# Patient Record
Sex: Female | Born: 1935 | Race: Black or African American | Hispanic: No | Smoking: Never smoker
Health system: Southern US, Community
[De-identification: ages and names within clinical notes are randomized; demographics above are authoritative.]

## PROBLEM LIST (undated history)

## (undated) DIAGNOSIS — R002 Palpitations: Secondary | ICD-10-CM

## (undated) DIAGNOSIS — E041 Nontoxic single thyroid nodule: Secondary | ICD-10-CM

## (undated) DIAGNOSIS — I10 Essential (primary) hypertension: Secondary | ICD-10-CM

## (undated) DIAGNOSIS — E785 Hyperlipidemia, unspecified: Secondary | ICD-10-CM

## (undated) DIAGNOSIS — R7303 Prediabetes: Secondary | ICD-10-CM

## (undated) DIAGNOSIS — F419 Anxiety disorder, unspecified: Secondary | ICD-10-CM

## (undated) DIAGNOSIS — R51 Headache: Secondary | ICD-10-CM

## (undated) DIAGNOSIS — Z9889 Other specified postprocedural states: Secondary | ICD-10-CM

## (undated) DIAGNOSIS — K219 Gastro-esophageal reflux disease without esophagitis: Secondary | ICD-10-CM

## (undated) DIAGNOSIS — R519 Headache, unspecified: Secondary | ICD-10-CM

## (undated) DIAGNOSIS — Z5189 Encounter for other specified aftercare: Secondary | ICD-10-CM

## (undated) DIAGNOSIS — D649 Anemia, unspecified: Secondary | ICD-10-CM

## (undated) DIAGNOSIS — H269 Unspecified cataract: Secondary | ICD-10-CM

## (undated) DIAGNOSIS — D573 Sickle-cell trait: Secondary | ICD-10-CM

## (undated) DIAGNOSIS — R42 Dizziness and giddiness: Secondary | ICD-10-CM

## (undated) DIAGNOSIS — H3530 Unspecified macular degeneration: Secondary | ICD-10-CM

## (undated) DIAGNOSIS — N6019 Diffuse cystic mastopathy of unspecified breast: Secondary | ICD-10-CM

## (undated) DIAGNOSIS — M1990 Unspecified osteoarthritis, unspecified site: Secondary | ICD-10-CM

## (undated) DIAGNOSIS — T7840XA Allergy, unspecified, initial encounter: Secondary | ICD-10-CM

## (undated) DIAGNOSIS — I712 Thoracic aortic aneurysm, without rupture: Secondary | ICD-10-CM

## (undated) HISTORY — DX: Anxiety disorder, unspecified: F41.9

## (undated) HISTORY — DX: Prediabetes: R73.03

## (undated) HISTORY — DX: Palpitations: R00.2

## (undated) HISTORY — DX: Headache: R51

## (undated) HISTORY — DX: Sickle-cell trait: D57.3

## (undated) HISTORY — DX: Allergy, unspecified, initial encounter: T78.40XA

## (undated) HISTORY — DX: Nontoxic single thyroid nodule: E04.1

## (undated) HISTORY — DX: Essential (primary) hypertension: I10

## (undated) HISTORY — DX: Unspecified macular degeneration: H35.30

## (undated) HISTORY — DX: Diffuse cystic mastopathy of unspecified breast: N60.19

## (undated) HISTORY — DX: Other specified postprocedural states: Z98.890

## (undated) HISTORY — DX: Unspecified cataract: H26.9

## (undated) HISTORY — DX: Headache, unspecified: R51.9

## (undated) HISTORY — DX: Anemia, unspecified: D64.9

## (undated) HISTORY — DX: Hyperlipidemia, unspecified: E78.5

## (undated) HISTORY — DX: Unspecified osteoarthritis, unspecified site: M19.90

## (undated) HISTORY — DX: Encounter for other specified aftercare: Z51.89

## (undated) HISTORY — PX: TOTAL KNEE ARTHROPLASTY: SHX125

---

## 2006-02-16 ENCOUNTER — Emergency Department (HOSPITAL_COMMUNITY): Admission: EM | Admit: 2006-02-16 | Discharge: 2006-02-16 | Payer: Self-pay | Admitting: Emergency Medicine

## 2006-05-10 ENCOUNTER — Emergency Department (HOSPITAL_COMMUNITY): Admission: EM | Admit: 2006-05-10 | Discharge: 2006-05-10 | Payer: Self-pay | Admitting: Family Medicine

## 2006-07-02 ENCOUNTER — Encounter: Admission: RE | Admit: 2006-07-02 | Discharge: 2006-07-02 | Payer: Self-pay | Admitting: Family Medicine

## 2006-07-15 ENCOUNTER — Encounter: Admission: RE | Admit: 2006-07-15 | Discharge: 2006-07-15 | Payer: Self-pay | Admitting: Family Medicine

## 2006-07-27 ENCOUNTER — Encounter: Admission: RE | Admit: 2006-07-27 | Discharge: 2006-07-27 | Payer: Self-pay | Admitting: Family Medicine

## 2006-08-05 ENCOUNTER — Emergency Department (HOSPITAL_COMMUNITY): Admission: EM | Admit: 2006-08-05 | Discharge: 2006-08-05 | Payer: Self-pay | Admitting: Family Medicine

## 2006-08-17 ENCOUNTER — Encounter: Admission: RE | Admit: 2006-08-17 | Discharge: 2006-08-17 | Payer: Self-pay | Admitting: Surgery

## 2006-08-17 ENCOUNTER — Encounter (INDEPENDENT_AMBULATORY_CARE_PROVIDER_SITE_OTHER): Payer: Self-pay | Admitting: *Deleted

## 2006-08-17 ENCOUNTER — Other Ambulatory Visit: Admission: RE | Admit: 2006-08-17 | Discharge: 2006-08-17 | Payer: Self-pay | Admitting: Interventional Radiology

## 2007-05-13 ENCOUNTER — Emergency Department (HOSPITAL_COMMUNITY): Admission: EM | Admit: 2007-05-13 | Discharge: 2007-05-13 | Payer: Self-pay | Admitting: Emergency Medicine

## 2007-06-28 ENCOUNTER — Encounter: Admission: RE | Admit: 2007-06-28 | Discharge: 2007-06-28 | Payer: Self-pay | Admitting: General Surgery

## 2007-07-20 ENCOUNTER — Encounter: Payer: Self-pay | Admitting: Gastroenterology

## 2007-08-10 ENCOUNTER — Encounter: Admission: RE | Admit: 2007-08-10 | Discharge: 2007-08-10 | Payer: Self-pay | Admitting: *Deleted

## 2007-08-31 ENCOUNTER — Encounter: Admission: RE | Admit: 2007-08-31 | Discharge: 2007-08-31 | Payer: Self-pay | Admitting: *Deleted

## 2007-10-29 DIAGNOSIS — I10 Essential (primary) hypertension: Secondary | ICD-10-CM | POA: Insufficient documentation

## 2007-10-29 DIAGNOSIS — H3530 Unspecified macular degeneration: Secondary | ICD-10-CM | POA: Insufficient documentation

## 2007-10-29 DIAGNOSIS — E041 Nontoxic single thyroid nodule: Secondary | ICD-10-CM | POA: Insufficient documentation

## 2007-10-29 DIAGNOSIS — N6019 Diffuse cystic mastopathy of unspecified breast: Secondary | ICD-10-CM | POA: Insufficient documentation

## 2007-10-31 ENCOUNTER — Encounter: Payer: Self-pay | Admitting: Gastroenterology

## 2007-11-01 ENCOUNTER — Ambulatory Visit: Payer: Self-pay | Admitting: Gastroenterology

## 2007-12-26 ENCOUNTER — Ambulatory Visit (HOSPITAL_COMMUNITY): Admission: RE | Admit: 2007-12-26 | Discharge: 2007-12-26 | Payer: Self-pay | Admitting: Obstetrics and Gynecology

## 2008-04-05 ENCOUNTER — Encounter: Admission: RE | Admit: 2008-04-05 | Discharge: 2008-04-05 | Payer: Self-pay | Admitting: Otolaryngology

## 2008-07-09 ENCOUNTER — Ambulatory Visit: Payer: Self-pay | Admitting: Internal Medicine

## 2008-08-10 ENCOUNTER — Encounter: Admission: RE | Admit: 2008-08-10 | Discharge: 2008-08-10 | Payer: Self-pay | Admitting: *Deleted

## 2008-09-13 ENCOUNTER — Ambulatory Visit: Payer: Self-pay

## 2008-09-13 ENCOUNTER — Encounter: Payer: Self-pay | Admitting: Internal Medicine

## 2008-09-13 ENCOUNTER — Ambulatory Visit: Payer: Self-pay | Admitting: Internal Medicine

## 2008-09-21 ENCOUNTER — Encounter: Admission: RE | Admit: 2008-09-21 | Discharge: 2008-09-21 | Payer: Self-pay | Admitting: *Deleted

## 2008-10-08 ENCOUNTER — Telehealth (INDEPENDENT_AMBULATORY_CARE_PROVIDER_SITE_OTHER): Payer: Self-pay | Admitting: *Deleted

## 2008-10-17 ENCOUNTER — Telehealth: Payer: Self-pay | Admitting: Internal Medicine

## 2008-11-08 ENCOUNTER — Ambulatory Visit: Payer: Self-pay | Admitting: Internal Medicine

## 2008-11-12 ENCOUNTER — Telehealth: Payer: Self-pay | Admitting: Internal Medicine

## 2008-12-10 ENCOUNTER — Encounter: Admission: RE | Admit: 2008-12-10 | Discharge: 2008-12-10 | Payer: Self-pay | Admitting: *Deleted

## 2009-02-14 ENCOUNTER — Ambulatory Visit: Payer: Self-pay | Admitting: Internal Medicine

## 2009-03-18 ENCOUNTER — Telehealth: Payer: Self-pay | Admitting: Internal Medicine

## 2009-04-01 ENCOUNTER — Emergency Department (HOSPITAL_COMMUNITY): Admission: EM | Admit: 2009-04-01 | Discharge: 2009-04-01 | Payer: Self-pay | Admitting: Family Medicine

## 2009-05-09 ENCOUNTER — Ambulatory Visit: Payer: Self-pay | Admitting: Internal Medicine

## 2009-05-27 ENCOUNTER — Ambulatory Visit: Payer: Self-pay | Admitting: Internal Medicine

## 2009-05-27 LAB — CONVERTED CEMR LAB
BUN: 7 mg/dL (ref 6–23)
Basophils Absolute: 0.1 10*3/uL (ref 0.0–0.1)
Basophils Relative: 0.9 % (ref 0.0–3.0)
CO2: 28 meq/L (ref 19–32)
Calcium: 9.1 mg/dL (ref 8.4–10.5)
Chloride: 105 meq/L (ref 96–112)
Creatinine, Ser: 0.8 mg/dL (ref 0.4–1.2)
Eosinophils Absolute: 0.1 10*3/uL (ref 0.0–0.7)
Eosinophils Relative: 2.4 % (ref 0.0–5.0)
GFR calc non Af Amer: 90.86 mL/min (ref >60)
Glucose, Bld: 90 mg/dL (ref 70–99)
HCT: 36.4 % (ref 36.0–46.0)
Hemoglobin: 12.3 g/dL (ref 12.0–15.0)
INR: 1 (ref 0.8–1.0)
Lymphocytes Relative: 35.5 % (ref 12.0–46.0)
Lymphs Abs: 2.1 10*3/uL (ref 0.7–4.0)
MCHC: 33.9 g/dL (ref 30.0–36.0)
MCV: 100.1 fL — ABNORMAL HIGH (ref 78.0–100.0)
Monocytes Absolute: 0.4 10*3/uL (ref 0.1–1.0)
Monocytes Relative: 6.9 % (ref 3.0–12.0)
Neutro Abs: 3.2 10*3/uL (ref 1.4–7.7)
Neutrophils Relative %: 54.3 % (ref 43.0–77.0)
Platelets: 166 10*3/uL (ref 150.0–400.0)
Potassium: 3.5 meq/L (ref 3.5–5.1)
Prothrombin Time: 10.3 s (ref 9.1–11.7)
RBC: 3.64 M/uL — ABNORMAL LOW (ref 3.87–5.11)
RDW: 12.5 % (ref 11.5–14.6)
Sodium: 140 meq/L (ref 135–145)
WBC: 5.9 10*3/uL (ref 4.5–10.5)
aPTT: 29.1 s — ABNORMAL HIGH (ref 21.7–28.8)

## 2009-05-29 ENCOUNTER — Telehealth (INDEPENDENT_AMBULATORY_CARE_PROVIDER_SITE_OTHER): Payer: Self-pay | Admitting: *Deleted

## 2009-06-03 ENCOUNTER — Ambulatory Visit: Payer: Self-pay | Admitting: Internal Medicine

## 2009-06-03 ENCOUNTER — Ambulatory Visit (HOSPITAL_COMMUNITY): Admission: RE | Admit: 2009-06-03 | Discharge: 2009-06-04 | Payer: Self-pay | Admitting: Internal Medicine

## 2009-06-10 ENCOUNTER — Telehealth: Payer: Self-pay | Admitting: Internal Medicine

## 2009-07-12 ENCOUNTER — Ambulatory Visit: Payer: Self-pay | Admitting: Internal Medicine

## 2009-07-15 ENCOUNTER — Ambulatory Visit: Payer: Self-pay | Admitting: Internal Medicine

## 2009-08-01 LAB — CONVERTED CEMR LAB
BUN: 14 mg/dL (ref 6–23)
CO2: 24 meq/L (ref 19–32)
Calcium: 9.3 mg/dL (ref 8.4–10.5)
Chloride: 102 meq/L (ref 96–112)
Creatinine, Ser: 0.7 mg/dL (ref 0.40–1.20)
Glucose, Bld: 86 mg/dL (ref 70–99)
Potassium: 3.9 meq/L (ref 3.5–5.3)
Sodium: 138 meq/L (ref 135–145)

## 2009-08-20 LAB — HM MAMMOGRAPHY: HM Mammogram: NORMAL

## 2009-10-01 ENCOUNTER — Other Ambulatory Visit: Admission: RE | Admit: 2009-10-01 | Discharge: 2009-10-01 | Payer: Self-pay | Admitting: Interventional Radiology

## 2009-10-01 ENCOUNTER — Encounter: Admission: RE | Admit: 2009-10-01 | Discharge: 2009-10-01 | Payer: Self-pay | Admitting: Internal Medicine

## 2009-11-06 ENCOUNTER — Other Ambulatory Visit: Admission: RE | Admit: 2009-11-06 | Discharge: 2009-11-06 | Payer: Self-pay | Admitting: Obstetrics and Gynecology

## 2009-12-03 ENCOUNTER — Encounter: Admission: RE | Admit: 2009-12-03 | Discharge: 2009-12-03 | Payer: Self-pay | Admitting: Obstetrics and Gynecology

## 2009-12-12 ENCOUNTER — Encounter: Admission: RE | Admit: 2009-12-12 | Discharge: 2009-12-12 | Payer: Self-pay | Admitting: Internal Medicine

## 2010-01-24 ENCOUNTER — Ambulatory Visit: Payer: Self-pay | Admitting: Gastroenterology

## 2010-01-24 DIAGNOSIS — R1013 Epigastric pain: Secondary | ICD-10-CM

## 2010-01-24 DIAGNOSIS — K3189 Other diseases of stomach and duodenum: Secondary | ICD-10-CM | POA: Insufficient documentation

## 2010-01-24 DIAGNOSIS — Z860100 Personal history of colon polyps, unspecified: Secondary | ICD-10-CM | POA: Insufficient documentation

## 2010-01-27 ENCOUNTER — Ambulatory Visit: Payer: Self-pay | Admitting: Cardiovascular Disease

## 2010-01-27 LAB — CONVERTED CEMR LAB
ALT: 14 units/L (ref 0–35)
AST: 18 units/L (ref 0–37)
Albumin: 4.2 g/dL (ref 3.5–5.2)
Alkaline Phosphatase: 62 units/L (ref 39–117)
BUN: 10 mg/dL (ref 6–23)
Basophils Absolute: 0 10*3/uL (ref 0.0–0.1)
Basophils Relative: 0.5 % (ref 0.0–3.0)
CO2: 31 meq/L (ref 19–32)
Calcium: 9.2 mg/dL (ref 8.4–10.5)
Chloride: 101 meq/L (ref 96–112)
Creatinine, Ser: 0.8 mg/dL (ref 0.4–1.2)
Eosinophils Absolute: 0.2 10*3/uL (ref 0.0–0.7)
Eosinophils Relative: 3.8 % (ref 0.0–5.0)
GFR calc non Af Amer: 90.69 mL/min (ref >60)
Glucose, Bld: 79 mg/dL (ref 70–99)
HCT: 33.4 % — ABNORMAL LOW (ref 36.0–46.0)
Hemoglobin: 11.5 g/dL — ABNORMAL LOW (ref 12.0–15.0)
Lymphocytes Relative: 37.5 % (ref 12.0–46.0)
Lymphs Abs: 2.2 10*3/uL (ref 0.7–4.0)
MCHC: 34.5 g/dL (ref 30.0–36.0)
MCV: 98.2 fL (ref 78.0–100.0)
Monocytes Absolute: 0.4 10*3/uL (ref 0.1–1.0)
Monocytes Relative: 7.6 % (ref 3.0–12.0)
Neutro Abs: 2.9 10*3/uL (ref 1.4–7.7)
Neutrophils Relative %: 50.6 % (ref 43.0–77.0)
Platelets: 185 10*3/uL (ref 150.0–400.0)
Potassium: 4 meq/L (ref 3.5–5.1)
RBC: 3.41 M/uL — ABNORMAL LOW (ref 3.87–5.11)
RDW: 13.4 % (ref 11.5–14.6)
Sed Rate: 36 mm/hr — ABNORMAL HIGH (ref 0–22)
Sodium: 139 meq/L (ref 135–145)
Total Bilirubin: 0.7 mg/dL (ref 0.3–1.2)
Total Protein: 7.3 g/dL (ref 6.0–8.3)
WBC: 5.7 10*3/uL (ref 4.5–10.5)

## 2010-01-31 ENCOUNTER — Ambulatory Visit: Payer: Self-pay | Admitting: Internal Medicine

## 2010-01-31 ENCOUNTER — Encounter (INDEPENDENT_AMBULATORY_CARE_PROVIDER_SITE_OTHER): Payer: Self-pay | Admitting: *Deleted

## 2010-02-04 ENCOUNTER — Telehealth (INDEPENDENT_AMBULATORY_CARE_PROVIDER_SITE_OTHER): Payer: Self-pay | Admitting: *Deleted

## 2010-02-04 ENCOUNTER — Telehealth: Payer: Self-pay | Admitting: Internal Medicine

## 2010-02-18 ENCOUNTER — Telehealth: Payer: Self-pay | Admitting: Internal Medicine

## 2010-02-20 ENCOUNTER — Ambulatory Visit: Payer: Self-pay | Admitting: Internal Medicine

## 2010-02-20 DIAGNOSIS — E785 Hyperlipidemia, unspecified: Secondary | ICD-10-CM | POA: Insufficient documentation

## 2010-02-20 LAB — CONVERTED CEMR LAB
ALT: 12 units/L (ref 0–35)
AST: 16 units/L (ref 0–37)
Albumin: 3.8 g/dL (ref 3.5–5.2)
Alkaline Phosphatase: 63 units/L (ref 39–117)
BUN: 11 mg/dL (ref 6–23)
Bilirubin, Direct: 0.1 mg/dL (ref 0.0–0.3)
CO2: 29 meq/L (ref 19–32)
Calcium: 9.4 mg/dL (ref 8.4–10.5)
Chloride: 104 meq/L (ref 96–112)
Cholesterol: 224 mg/dL — ABNORMAL HIGH (ref 0–200)
Creatinine, Ser: 0.7 mg/dL (ref 0.4–1.2)
Direct LDL: 134.4 mg/dL
GFR calc non Af Amer: 100.77 mL/min (ref >60)
Glucose, Bld: 84 mg/dL (ref 70–99)
HDL: 58.1 mg/dL (ref >39.00)
Potassium: 4.2 meq/L (ref 3.5–5.1)
Sodium: 140 meq/L (ref 135–145)
TSH: 0.7 microintl units/mL (ref 0.35–5.50)
Total Bilirubin: 0.6 mg/dL (ref 0.3–1.2)
Total CHOL/HDL Ratio: 4
Total Protein: 7 g/dL (ref 6.0–8.3)
Triglycerides: 148 mg/dL (ref 0.0–149.0)
VLDL: 29.6 mg/dL (ref 0.0–40.0)

## 2010-02-21 ENCOUNTER — Encounter (INDEPENDENT_AMBULATORY_CARE_PROVIDER_SITE_OTHER): Payer: Self-pay | Admitting: *Deleted

## 2010-02-26 ENCOUNTER — Encounter (INDEPENDENT_AMBULATORY_CARE_PROVIDER_SITE_OTHER): Payer: Self-pay | Admitting: *Deleted

## 2010-02-28 ENCOUNTER — Ambulatory Visit: Payer: Self-pay | Admitting: Internal Medicine

## 2010-02-28 ENCOUNTER — Encounter (INDEPENDENT_AMBULATORY_CARE_PROVIDER_SITE_OTHER): Payer: Self-pay | Admitting: *Deleted

## 2010-03-04 ENCOUNTER — Telehealth: Payer: Self-pay | Admitting: Internal Medicine

## 2010-03-06 ENCOUNTER — Ambulatory Visit: Payer: Self-pay | Admitting: Internal Medicine

## 2010-03-10 ENCOUNTER — Encounter (INDEPENDENT_AMBULATORY_CARE_PROVIDER_SITE_OTHER): Payer: Self-pay | Admitting: *Deleted

## 2010-03-11 ENCOUNTER — Ambulatory Visit (HOSPITAL_COMMUNITY): Admission: RE | Admit: 2010-03-11 | Discharge: 2010-03-11 | Payer: Self-pay | Admitting: Orthopedic Surgery

## 2010-03-17 ENCOUNTER — Ambulatory Visit: Payer: Self-pay | Admitting: Internal Medicine

## 2010-03-17 DIAGNOSIS — R928 Other abnormal and inconclusive findings on diagnostic imaging of breast: Secondary | ICD-10-CM | POA: Insufficient documentation

## 2010-03-18 ENCOUNTER — Encounter (INDEPENDENT_AMBULATORY_CARE_PROVIDER_SITE_OTHER): Payer: Self-pay | Admitting: Internal Medicine

## 2010-03-18 ENCOUNTER — Observation Stay (HOSPITAL_COMMUNITY): Admission: EM | Admit: 2010-03-18 | Discharge: 2010-03-19 | Payer: Self-pay | Admitting: Emergency Medicine

## 2010-03-21 ENCOUNTER — Ambulatory Visit: Payer: Self-pay | Admitting: Gastroenterology

## 2010-03-25 ENCOUNTER — Telehealth (INDEPENDENT_AMBULATORY_CARE_PROVIDER_SITE_OTHER): Payer: Self-pay

## 2010-03-26 ENCOUNTER — Encounter: Payer: Self-pay | Admitting: Cardiovascular Disease

## 2010-03-26 ENCOUNTER — Encounter (HOSPITAL_COMMUNITY): Admission: RE | Admit: 2010-03-26 | Discharge: 2010-04-07 | Payer: Self-pay | Admitting: Internal Medicine

## 2010-03-26 ENCOUNTER — Ambulatory Visit: Payer: Self-pay

## 2010-03-26 ENCOUNTER — Ambulatory Visit: Payer: Self-pay | Admitting: Cardiovascular Disease

## 2010-04-02 ENCOUNTER — Encounter: Admission: RE | Admit: 2010-04-02 | Discharge: 2010-04-02 | Payer: Self-pay | Admitting: Internal Medicine

## 2010-04-04 ENCOUNTER — Ambulatory Visit: Payer: Self-pay | Admitting: Internal Medicine

## 2010-04-04 DIAGNOSIS — H9190 Unspecified hearing loss, unspecified ear: Secondary | ICD-10-CM | POA: Insufficient documentation

## 2010-04-04 LAB — CONVERTED CEMR LAB
Cholesterol, target level: 200 mg/dL
HDL goal, serum: 40 mg/dL
LDL Goal: 130 mg/dL

## 2010-04-07 ENCOUNTER — Encounter (INDEPENDENT_AMBULATORY_CARE_PROVIDER_SITE_OTHER): Payer: Self-pay | Admitting: *Deleted

## 2010-04-22 ENCOUNTER — Encounter: Payer: Self-pay | Admitting: Internal Medicine

## 2010-04-22 ENCOUNTER — Telehealth: Payer: Self-pay | Admitting: Internal Medicine

## 2010-04-29 ENCOUNTER — Encounter (INDEPENDENT_AMBULATORY_CARE_PROVIDER_SITE_OTHER): Payer: Self-pay | Admitting: *Deleted

## 2010-05-05 ENCOUNTER — Ambulatory Visit: Payer: Self-pay | Admitting: Gastroenterology

## 2010-05-19 ENCOUNTER — Telehealth: Payer: Self-pay | Admitting: Gastroenterology

## 2010-05-20 ENCOUNTER — Ambulatory Visit: Payer: Self-pay | Admitting: Gastroenterology

## 2010-05-20 DIAGNOSIS — R933 Abnormal findings on diagnostic imaging of other parts of digestive tract: Secondary | ICD-10-CM | POA: Insufficient documentation

## 2010-05-20 LAB — HM COLONOSCOPY

## 2010-05-21 ENCOUNTER — Telehealth: Payer: Self-pay | Admitting: Gastroenterology

## 2010-05-21 DIAGNOSIS — K644 Residual hemorrhoidal skin tags: Secondary | ICD-10-CM | POA: Insufficient documentation

## 2010-05-30 ENCOUNTER — Ambulatory Visit: Payer: Self-pay | Admitting: Internal Medicine

## 2010-05-30 DIAGNOSIS — IMO0001 Reserved for inherently not codable concepts without codable children: Secondary | ICD-10-CM | POA: Insufficient documentation

## 2010-05-30 DIAGNOSIS — I712 Thoracic aortic aneurysm, without rupture: Secondary | ICD-10-CM | POA: Insufficient documentation

## 2010-05-30 LAB — CONVERTED CEMR LAB
ALT: 17 units/L (ref 0–35)
AST: 20 units/L (ref 0–37)
Albumin: 3.8 g/dL (ref 3.5–5.2)
Alkaline Phosphatase: 70 units/L (ref 39–117)
BUN: 15 mg/dL (ref 6–23)
Basophils Absolute: 0 10*3/uL (ref 0.0–0.1)
Basophils Relative: 0.6 % (ref 0.0–3.0)
Bilirubin, Direct: 0.1 mg/dL (ref 0.0–0.3)
CO2: 28 meq/L (ref 19–32)
Calcium: 9 mg/dL (ref 8.4–10.5)
Chloride: 105 meq/L (ref 96–112)
Cholesterol, target level: 200 mg/dL
Cholesterol: 227 mg/dL — ABNORMAL HIGH (ref 0–200)
Creatinine, Ser: 0.9 mg/dL (ref 0.4–1.2)
Direct LDL: 134 mg/dL
Eosinophils Absolute: 0.2 10*3/uL (ref 0.0–0.7)
Eosinophils Relative: 4 % (ref 0.0–5.0)
GFR calc non Af Amer: 82.24 mL/min (ref >60.00)
Glucose, Bld: 89 mg/dL (ref 70–99)
HCT: 34.1 % — ABNORMAL LOW (ref 36.0–46.0)
HDL goal, serum: 40 mg/dL
HDL: 57.1 mg/dL (ref >39.00)
Hemoglobin: 11.6 g/dL — ABNORMAL LOW (ref 12.0–15.0)
LDL Goal: 100 mg/dL
Lymphocytes Relative: 43.6 % (ref 12.0–46.0)
Lymphs Abs: 2.7 10*3/uL (ref 0.7–4.0)
MCHC: 34 g/dL (ref 30.0–36.0)
MCV: 98.2 fL (ref 78.0–100.0)
Monocytes Absolute: 0.4 10*3/uL (ref 0.1–1.0)
Monocytes Relative: 6.9 % (ref 3.0–12.0)
Neutro Abs: 2.8 10*3/uL (ref 1.4–7.7)
Neutrophils Relative %: 44.9 % (ref 43.0–77.0)
Platelets: 179 10*3/uL (ref 150.0–400.0)
Potassium: 3.7 meq/L (ref 3.5–5.1)
RBC: 3.48 M/uL — ABNORMAL LOW (ref 3.87–5.11)
RDW: 13.6 % (ref 11.5–14.6)
Sodium: 142 meq/L (ref 135–145)
TSH: 0.4 microintl units/mL (ref 0.35–5.50)
Total Bilirubin: 0.5 mg/dL (ref 0.3–1.2)
Total CHOL/HDL Ratio: 4
Total CK: 143 units/L (ref 7–177)
Total Protein: 7.1 g/dL (ref 6.0–8.3)
Triglycerides: 119 mg/dL (ref 0.0–149.0)
VLDL: 23.8 mg/dL (ref 0.0–40.0)
WBC: 6.2 10*3/uL (ref 4.5–10.5)

## 2010-06-01 ENCOUNTER — Encounter: Payer: Self-pay | Admitting: Internal Medicine

## 2010-06-05 ENCOUNTER — Ambulatory Visit: Payer: Self-pay | Admitting: Internal Medicine

## 2010-06-06 ENCOUNTER — Ambulatory Visit: Payer: Self-pay | Admitting: Internal Medicine

## 2010-06-16 ENCOUNTER — Emergency Department (HOSPITAL_COMMUNITY)
Admission: EM | Admit: 2010-06-16 | Discharge: 2010-06-16 | Payer: Self-pay | Source: Home / Self Care | Admitting: Emergency Medicine

## 2010-06-26 ENCOUNTER — Ambulatory Visit
Admission: RE | Admit: 2010-06-26 | Discharge: 2010-06-26 | Payer: Self-pay | Source: Home / Self Care | Attending: Internal Medicine | Admitting: Internal Medicine

## 2010-06-26 DIAGNOSIS — R51 Headache: Secondary | ICD-10-CM | POA: Insufficient documentation

## 2010-06-26 DIAGNOSIS — R42 Dizziness and giddiness: Secondary | ICD-10-CM | POA: Insufficient documentation

## 2010-06-26 DIAGNOSIS — S0990XA Unspecified injury of head, initial encounter: Secondary | ICD-10-CM | POA: Insufficient documentation

## 2010-06-26 DIAGNOSIS — R519 Headache, unspecified: Secondary | ICD-10-CM | POA: Insufficient documentation

## 2010-06-26 DIAGNOSIS — F419 Anxiety disorder, unspecified: Secondary | ICD-10-CM | POA: Insufficient documentation

## 2010-06-26 DIAGNOSIS — R279 Unspecified lack of coordination: Secondary | ICD-10-CM | POA: Insufficient documentation

## 2010-07-06 ENCOUNTER — Encounter
Admission: RE | Admit: 2010-07-06 | Discharge: 2010-07-06 | Payer: Self-pay | Source: Home / Self Care | Attending: Internal Medicine | Admitting: Internal Medicine

## 2010-07-11 ENCOUNTER — Telehealth: Payer: Self-pay | Admitting: Internal Medicine

## 2010-07-13 ENCOUNTER — Encounter: Payer: Self-pay | Admitting: Family Medicine

## 2010-07-13 ENCOUNTER — Encounter: Payer: Self-pay | Admitting: Internal Medicine

## 2010-07-14 ENCOUNTER — Encounter: Payer: Self-pay | Admitting: Endocrinology

## 2010-07-16 ENCOUNTER — Ambulatory Visit
Admission: RE | Admit: 2010-07-16 | Discharge: 2010-07-16 | Payer: Self-pay | Source: Home / Self Care | Attending: Internal Medicine | Admitting: Internal Medicine

## 2010-07-21 ENCOUNTER — Ambulatory Visit: Admit: 2010-07-21 | Payer: Self-pay | Admitting: Internal Medicine

## 2010-07-22 LAB — COMPREHENSIVE METABOLIC PANEL
ALT: 16 U/L (ref 0–35)
AST: 19 U/L (ref 0–37)
Albumin: 3.8 g/dL (ref 3.5–5.2)
Alkaline Phosphatase: 75 U/L (ref 39–117)
BUN: 15 mg/dL (ref 6–23)
CO2: 27 mEq/L (ref 19–32)
Calcium: 9.3 mg/dL (ref 8.4–10.5)
Chloride: 105 mEq/L (ref 96–112)
Creatinine, Ser: 0.87 mg/dL (ref 0.4–1.2)
GFR calc Af Amer: >60 mL/min (ref >60)
GFR calc non Af Amer: >60 mL/min (ref >60)
Glucose, Bld: 84 mg/dL (ref 70–99)
Potassium: 3.8 mEq/L (ref 3.5–5.1)
Sodium: 137 mEq/L (ref 135–145)
Total Bilirubin: 0.5 mg/dL (ref 0.3–1.2)
Total Protein: 6.9 g/dL (ref 6.0–8.3)

## 2010-07-22 LAB — SURGICAL PCR SCREEN
MRSA, PCR: NEGATIVE
Staphylococcus aureus: NEGATIVE

## 2010-07-22 LAB — CBC
HCT: 33.6 % — ABNORMAL LOW (ref 36.0–46.0)
Hemoglobin: 11.7 g/dL — ABNORMAL LOW (ref 12.0–15.0)
MCH: 31.7 pg (ref 26.0–34.0)
MCHC: 34.8 g/dL (ref 30.0–36.0)
MCV: 91.1 fL (ref 78.0–100.0)
Platelets: 191 10*3/uL (ref 150–400)
RBC: 3.69 MIL/uL — ABNORMAL LOW (ref 3.87–5.11)
RDW: 12.7 % (ref 11.5–15.5)
WBC: 6.3 10*3/uL (ref 4.0–10.5)

## 2010-07-22 LAB — DIFFERENTIAL
Basophils Absolute: 0 10*3/uL (ref 0.0–0.1)
Basophils Relative: 0 % (ref 0–1)
Eosinophils Absolute: 0.2 10*3/uL (ref 0.0–0.7)
Eosinophils Relative: 4 % (ref 0–5)
Lymphocytes Relative: 60 % — ABNORMAL HIGH (ref 12–46)
Lymphs Abs: 3.7 10*3/uL (ref 0.7–4.0)
Monocytes Absolute: 0.4 10*3/uL (ref 0.1–1.0)
Monocytes Relative: 6 % (ref 3–12)
Neutro Abs: 1.9 10*3/uL (ref 1.7–7.7)
Neutrophils Relative %: 30 % — ABNORMAL LOW (ref 43–77)

## 2010-07-22 NOTE — Assessment & Plan Note (Signed)
Summary: rov/discuss ablation/kfw   Visit Type:  Follow-up Referring Provider:  Ledora Bottcher Primary Provider:  Jimmye Norman MD   History of Present Illness: [name] returns today for followup.  She is a 75 yo woman with a h/o SVT and HTN.  I saw her several months ago and noted SVT at 160 with medical therapy.  I recommended ablation and she has considered this.  She has continued to have these symptoms and thinks that she is now ready to proceed.  She denies syncope, c/p or peripheral edema.  Current Medications (verified): 1)  Medi-Meclizine 25 Mg  Tabs (Meclizine Hcl) .... Once Daily 2)  Bayer Childrens Aspirin 81 Mg  Chew (Aspirin) .... Once Daily 3)  Triamterene-Hctz 37.5-25 Mg Caps (Triamterene-Hctz) .Marland Kitchen.. 1 By Mouth Once Daily 4)  Eye Vit .... Daily 5)  Atenolol 25 Mg Tabs (Atenolol) .... Two Times A Day 6)  Icaps Areds Formula  Tabs (Multiple Vitamins-Minerals) .... Take One Tablet By Mouth Once Daily.  Allergies: 1)  ! Motrin  Past History:  Past Medical History: Last updated: 05/09/2009 Current Problems:  PALPITATIONS (ICD-785.1) HYPERTENSION (ICD-401.9) MACULAR DEGENERATION (ICD-362.50) THYROID NODULE (ICD-241.0) FIBROCYSTIC BREAST DISEASE (ICD-610.1)    Past Surgical History: Last updated: 09/12/2008 Knee replacement 9 years ago.   Review of Systems  The patient denies chest pain, syncope, dyspnea on exertion, and peripheral edema.    Vital Signs:  Patient profile:   75 year old female Height:      68 inches Weight:      167 pounds Pulse rate:   60 / minute BP sitting:   120 / 70  (left arm)  Vitals Entered By: Laurance Flatten CMA (May 09, 2009 11:31 AM)  Physical Exam  General:  Well developed, well nourished, in no acute distress. Head:  normocephalic and atraumatic Eyes:  PERRLA/EOM intact; conjunctiva and lids normal. Mouth:  Teeth, gums and palate normal. Oral mucosa normal. Neck:  Neck supple, no JVD. No masses, thyromegaly or abnormal  cervical nodes. Lungs:  Clear bilaterally to auscultation with no wheezes, rales, or rhonchi. Heart:  RRR with out murmur, rub or gallop.  Normal PMI. Abdomen:  Bowel sounds positive; abdomen soft and non-tender without masses, organomegaly, or hernias noted. No hepatosplenomegaly. Msk:  Back normal, normal gait. Muscle strength and tone normal. Pulses:  pulses normal in all 4 extremities Extremities:  No clubbing or cyanosis. Neurologic:  Alert and oriented x 3.   EKG  Procedure date:  05/09/2009  Findings:      Normal sinus rhythm with rate of: 60.  First degree AV-Block noted.    Impression & Recommendations:  Problem # 1:  PALPITATIONS (ICD-785.1) I have discussed the risks, benefits, goals and expectations of EPS/RFA of SVT and she wishes to proceed. Her updated medication list for this problem includes:    Bayer Childrens Aspirin 81 Mg Chew (Aspirin) ..... Once daily    Atenolol 25 Mg Tabs (Atenolol) .Marland Kitchen..Marland Kitchen Two times a day  Orders: EKG w/ Interpretation (93000)  Problem # 2:  HYPERTENSION (ICD-401.9) Her blood pressure has been well controlled.  Continue current meds and maintain a low sodium diet. Her updated medication list for this problem includes:    Bayer Childrens Aspirin 81 Mg Chew (Aspirin) ..... Once daily    Triamterene-hctz 37.5-25 Mg Caps (Triamterene-hctz) .Marland Kitchen... 1 by mouth once daily    Atenolol 25 Mg Tabs (Atenolol) .Marland Kitchen..Marland Kitchen Two times a day  Patient Instructions: 1)  Your physician has recommended that you  have an ablation.  Catheter ablation is a medical procedure used to treat some cardiac arrhythmias (irregular heartbeats). During catheter ablation, a long, thin, flexible tube is put into a blood vessel in your groin (upper thigh), or neck. This tube is called an ablation catheter. It is then guided to your heart through the blood vessel. Radiofrequency waves destroy small areas of heart tissue where abnormal heartbeats may cause an arrhythmia to start.  Please  see the instruction sheet given to you today.

## 2010-07-22 NOTE — Assessment & Plan Note (Signed)
History of Present Illness Visit Type: Initial Visit Primary GI MD: Rob Bunting MD Primary Arohi Salvatierra: Jimmye Norman MD Requesting Livio Ledwith: Dr. Sharyn Lull  Chief Complaint: Generalized cramping abd pain that can be sharp at times with nausea, no vomiting.  History of Present Illness:     pleasant 75 year old womanwho has had stomach cramping.  She just doesn' tfeel right.  She has pain, epigastric usually but sometimes lower.  The pains are intermittent, worse lately.  These pains have been going on for many years.  The pains are better if she eats. Worse if she does not eat.  See has nausea at times. the pain can last 4 hours.   No vomiting.  No dysphagia.  Overall her weight is stable.  She can be constipated.  The pains sometimes improve with BM. She had a colonoscopy about 2 years in [place] and a polyp was found, she is very unclear about the details.  She went to a Dr. in [place], he told her she had GERD. Another Dr. In [place] and they said she had a hiatal hernia, gas pains.  She thinks she had an EGD 10 years ago...sounds like she had a hernia.           Current Medications (verified): 1)  Bayer Childrens Aspirin 81 Mg  Chew (Aspirin) .... Once Daily 2)  Atenolol 50 Mg Tabs (Atenolol) .... One Tablet By Mouth Once Daily 3)  Icaps Areds Formula  Tabs (Multiple Vitamins-Minerals) .... Take One Tablet By Mouth Once Daily. 4)  Multivitamins   Tabs (Multiple Vitamin) .... Once Daily  Allergies (verified): 1)  ! Motrin  Past History:  Past Medical History: ALPITATIONS (ICD-785.1) HYPERTENSION (ICD-401.9) MACULAR DEGENERATION (ICD-362.50) THYROID NODULE (ICD-241.0) FIBROCYSTIC BREAST DISEASE (ICD-610.1)     Past Surgical History: Knee replacement 9 years ago.    Family History: neg for CAD Family History of Coronary Artery Disease:  no colon cancer  Social History:  The patient does not smoke, does not drink.  Is a   retired Agricultural engineer. does not  drink alcohol,  Review of Systems       Pertinent positive and negative review of systems were noted in the above HPI and GI specific review of systems.  All other review of systems was otherwise negative.   Vital Signs:  Patient profile:   75 year old female Height:      68 inches Weight:      167.13 pounds BMI:     25.50 Pulse rate:   70 / minute Pulse rhythm:   regular BP sitting:   128 / 72  (left arm) Cuff size:   regular  Vitals Entered By: Christie Nottingham CMA Duncan Dull) (January 24, 2010 10:40 AM)  Physical Exam  Additional Exam:  Constitutional: generally well appearing Psychiatric: alert and oriented times 3 Eyes: extraocular movements intact Mouth: oropharynx moist, no lesions Neck: supple, no lymphadenopathy Cardiovascular: heart regular rate and rythm Lungs: CTA bilaterally Abdomen: soft, non-tender, non-distended, no obvious ascites, no peritoneal signs, normal bowel sounds Extremities: no lower extremity edema bilaterally Skin: no lesions on visible extremities    Impression & Recommendations:  Problem # 1:  epigastric abdominal pain this is been going on for many years. She has been told that she has heartburn in the past. She has been told she has a hiatal hernia. For now she will start proton pump inhibitor once daily. Perhaps she has peptic ulcer disease from aspirin daily. She will get basic set  of labs including CBC, complete metabolic profile, sedimentation rate. She is somewhat tender on examination I would like to proceed with CT scan before committing to any endoscopic procedures.  Problem # 2:  history of colon polyp she had a colonoscopy here in [place] is in the past 2-3 years. She does not recall who did, when they did, or exactly what was found. She does remember being told a polyp was found but was not removed and that it needed to be removed. We will call around town to the area gastroenterologist to look into this. If we cannot find any  information then she will probably need a colonoscopy.  Other Orders: TLB-CBC Platelet - w/Differential (85025-CBCD) TLB-CMP (Comprehensive Metabolic Pnl) (80053-COMP) TLB-Sedimentation Rate (ESR) (85652-ESR)  Patient Instructions: 1)  You will be scheduled for a CT scan of abdomen and pelvis with IV and oral contrast. 2)  You will get lab test(s) done today (cbc, cmet, esr). 3)  We will call [facility] and Dr. Kenna Gilbert and Dr. Jennye Boroughs office to see if you have had colonoscopy there, get records. Will plan on colonoscopy after reviewing those records, may need an EGD at that time. 4)  We will help with new PCP at [facility]. 5)  Samples of nexium given, taken one pill 20-30 min before breakfast meal daily. 6)  The medication list was reviewed and reconciled.  All changed / newly prescribed medications were explained.  A complete medication list was provided to the patient / caregiver.  Appended Document: Orders Update/FLEX    Clinical Lists Changes  Problems: Added new problem of NONSPECIFIC ABN FINDING RAD & OTH EXAM GI TRACT (ICD-793.4) Added new problem of GASTROPARESIS (ICD-536.3) Orders: Added new Test order of Flex with Sedation (Flex w/Sed) - Signed      Appended Document: Orders Update/CT    Clinical Lists Changes  Problems: Removed problem of GASTROPARESIS (ICD-536.3) Removed problem of NONSPECIFIC ABN FINDING RAD & OTH EXAM GI TRACT (ICD-793.4) Added new problem of ABDOMINAL PAIN OTHER SPECIFIED SITE (ICD-789.09) Orders: Added new Referral order of CT Abdomen/Pelvis with Contrast (CT Abd/Pelvis w/con) - Signed      Appended Document:  received faxed records:  colonoscopy 06/2007 by Dr. Wandalee Ferdinand documented a "soft rectal mass" otherwise normal examination to the cecum. Dr. Evette Cristal wasen't sure if the mass was a "polyp, hemorrhoid, or skin tissue" and referred her to see a surgeon.  Patty, can you call [facility] to see if they have any records on this patient, have  them sent here. thanks  Appended Document:  [facility] does have records on the pt but the pt needs to go and sign a release.  No answer on the home phone unable to leave a message  Appended Document:  pt aware and will sign the release today  Appended Document:  pt has not signed release called and reminded the pt that Dr Christella Hartigan needs the records for review.  Appended Document:  no answer on home phone

## 2010-07-22 NOTE — Progress Notes (Signed)
Summary: OV THURSDAY  Phone Note Call from Patient   Summary of Call: Pt was seen by ortho but left mess that they could not drain knee and she may need another referral for different MD.  Initial call taken by: Lamar Sprinkles, CMA,  February 18, 2010 4:27 PM  Follow-up for Phone Call        Per pt, MD that she was referred to does not do total knee replacement. She has been set up to see another ortho MD in september. Pt c/o increased swelling in her knee. Original Ortho Md advised pt that she must see her PCP for eval until she can be seen by the other ortho in September. Scheduled for office visit Thursday for eval by PCP.  Follow-up by: Lamar Sprinkles, CMA,  February 18, 2010 5:44 PM     Appended Document: OV THURSDAY please get the name of the ortho that she saw  Appended Document: OV THURSDAY Pt was seen not actually seen by Dr Rennis Chris at [facility] on 08/19. Pt has had previous surgery on that knee and she requested eval for a total knee replacement. Dr Rennis Chris does not do this type of surgery so he had pt re-scheduled to 09/08 with Dr Charlann Boxer for eval for same

## 2010-07-22 NOTE — Assessment & Plan Note (Signed)
Summary: elev bp/#/cd--rm 8   Vital Signs:  Patient profile:   75 year old female Height:      68 inches Weight:      172 pounds BMI:     26.25 O2 Sat:      96 % on Room air Temp:     97.9 degrees F oral Pulse rate:   69 / minute Pulse rhythm:   regular Resp:     16 per minute BP sitting:   130 / 80  (left arm) Cuff size:   regular  Vitals Entered By: Mervin Kung CMA (AAMA) (February 28, 2010 2:25 PM)  O2 Flow:  Room air CC: Rm 8  Pt states her blood pressure has been elevated since starting new BP med. Is Patient Diabetic? No   Primary Care Provider:  Etta Grandchild MD  CC:  Rm 8  Pt states her blood pressure has been elevated since starting new BP med.Marland Kitchen  History of Present Illness:  Follow-Up Visit      This is a 75 year old woman who presents for Follow-up visit.  The patient denies chest pain, palpitations, dizziness, syncope, edema, SOB, DOE, PND, and orthopnea.  Since the last visit the patient notes no new problems or concerns and being seen by a specialist.  The patient reports taking meds as prescribed and monitoring BP.  When questioned about possible medication side effects, the patient notes none.    Preventive Screening-Counseling & Management  Alcohol-Tobacco     Alcohol drinks/day: 0     Smoking Status: never     Tobacco Counseling: not indicated; no tobacco use  Hep-HIV-STD-Contraception     Hepatitis Risk: no risk noted     HIV Risk: no risk noted     STD Risk: no risk noted     SBE monthly: yes     SBE Education/Counseling: to perform regular SBE  Medications Prior to Update: 1)  Bayer Childrens Aspirin 81 Mg  Chew (Aspirin) .... Once Daily 2)  Icaps Areds Formula  Tabs (Multiple Vitamins-Minerals) .... Take One Tablet By Mouth Once Daily. 3)  Multivitamins   Tabs (Multiple Vitamin) .... Once Daily 4)  Nexium 40mg  .... Take 1 Tablet By Mouth Once A Day 5)  Hyomax-Sr 0.375 Mg Xr12h-Tab (Hyoscyamine Sulfate) .... One By Mouth Two Times A Day  As Needed For Abd Bloating 6)  Bystolic 10 Mg Tabs (Nebivolol Hcl) .... One By Mouth Once Daily For High Blood Pressure 7)  Vytorin 10-40 Mg Tabs (Ezetimibe-Simvastatin) .... One By Mouth Once Daily For Cholesterol 8)  Mobic 7.5 Mg Tabs (Meloxicam) .... One By Mouth Once Daily As Needed For Knee Pain  Current Medications (verified): 1)  Bayer Childrens Aspirin 81 Mg  Chew (Aspirin) .... Once Daily 2)  Icaps Areds Formula  Tabs (Multiple Vitamins-Minerals) .... Take One Tablet By Mouth Once Daily. 3)  Multivitamins   Tabs (Multiple Vitamin) .... Once Daily 4)  Nexium 40mg  .... Take 1 Tablet By Mouth Once A Day 5)  Hyomax-Sr 0.375 Mg Xr12h-Tab (Hyoscyamine Sulfate) .... One By Mouth Two Times A Day As Needed For Abd Bloating 6)  Bystolic 10 Mg Tabs (Nebivolol Hcl) .... One By Mouth Once Daily For High Blood Pressure 7)  Vytorin 10-40 Mg Tabs (Ezetimibe-Simvastatin) .... One By Mouth Once Daily For Cholesterol 8)  Mobic 7.5 Mg Tabs (Meloxicam) .... One By Mouth Once Daily As Needed For Knee Pain  Allergies: 1)  ! Motrin  Past History:  Past  Medical History: Last updated: 02/20/2010 ALPITATIONS (ICD-785.1) HYPERTENSION (ICD-401.9) MACULAR DEGENERATION (ICD-362.50) THYROID NODULE (ICD-241.0) FIBROCYSTIC BREAST DISEASE (ICD-610.1)    Hyperlipidemia  Past Surgical History: Last updated: 01/31/2010 Knee  (Rt.) replacement 9 years ago.    Family History: Last updated: 01/24/2010 neg for CAD Family History of Coronary Artery Disease:  no colon cancer  Social History: Last updated: 01/31/2010  The patient does not smoke, does not drink.  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 neg for  CAD Family History of Coronary Artery Disease:  no colon cancer  Social History: Reviewed history from 01/31/2010 and no changes required.  The patient does not smoke, does not drink.  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    Physical Exam  General:  alert, well-developed, well-nourished, well-hydrated, appropriate dress, normal appearance, healthy-appearing, cooperative to examination, good hygiene, and overweight-appearing.   Mouth:  Oral mucosa and oropharynx without lesions or exudates.  Teeth in good repair. Neck:  supple, full ROM, no masses, no thyromegaly, no JVD, normal carotid upstroke, and no carotid bruits.   Lungs:  normal respiratory effort, no intercostal retractions, no accessory muscle use, normal breath sounds, no dullness, no fremitus, no crackles, and no wheezes.   Heart:  normal rate, regular rhythm, no murmur, no gallop, and no rub.   Abdomen:  soft, non-tender, normal bowel sounds, no distention, no masses, no guarding, no rigidity, no hepatomegaly, and no splenomegaly.   Msk:  right knee is mildly swollen but there is no ttp and there is good ROM with no joint laxity. Pulses:  R and L carotid,radial,femoral,dorsalis pedis and posterior tibial pulses are full and equal bilaterally Extremities:  No clubbing, cyanosis, edema, or deformity noted with normal full range of motion of all joints.   Neurologic:  No cranial nerve deficits noted. Station and gait are normal. Plantar reflexes are down-going bilaterally. DTRs are symmetrical throughout. Sensory, motor and coordinative functions appear intact. Skin:  Intact without suspicious lesions or rashes Psych:  Cognition and judgment appear intact. Alert and  cooperative with normal attention span and concentration. No apparent delusions, illusions, hallucinations   Impression & Recommendations:  Problem # 1:  HYPERTENSION (ICD-401.9) Assessment Improved  Her updated medication list for this problem includes:    Bystolic 10 Mg Tabs (Nebivolol hcl) ..... One by mouth once daily for high blood pressure  BP today: 130/80 Prior BP: 148/98 (02/20/2010)  Prior 10 Yr Risk Heart Disease: Not enough information (02/20/2010)  Labs Reviewed: K+: 4.2 (02/20/2010) Creat: : 0.7 (02/20/2010)   Chol: 224 (02/20/2010)   HDL: 58.10 (02/20/2010)   TG: 148.0 (02/20/2010)  Complete Medication List: 1)  Bayer Childrens Aspirin 81 Mg Chew (Aspirin) .... Once daily 2)  Icaps Areds Formula Tabs (Multiple vitamins-minerals) .... Take one tablet by mouth once daily. 3)  Multivitamins Tabs (Multiple vitamin) .... Once daily 4)  Nexium 40mg   .... Take 1 tablet by mouth once a day 5)  Hyomax-sr 0.375 Mg Xr12h-tab (Hyoscyamine sulfate) .... One by mouth two times a day as  needed for abd bloating 6)  Bystolic 10 Mg Tabs (Nebivolol hcl) .... One by mouth once daily for high blood pressure 7)  Vytorin 10-40 Mg Tabs (Ezetimibe-simvastatin) .... One by mouth once daily for cholesterol 8)  Mobic 7.5 Mg Tabs (Meloxicam) .... One by mouth once daily as needed for knee pain  Patient Instructions: 1)  Please schedule a follow-up appointment in 2 months. 2)  Limit your Sodium (Salt) to less than 4 grams a day (slightly less than 1 teaspoon) to prevent fluid retention, swelling, or worsening or symptoms. 3)  It is important that you exercise regularly at least 20 minutes 5 times a week. If you develop chest pain, have severe difficulty breathing, or feel very tired , stop exercising immediately and seek medical attention. 4)  You need to lose weight. Consider a lower calorie diet and regular exercise.  5)  Check your Blood Pressure regularly. If it is above 130/80: you should  make an appointment.  Current Allergies (reviewed today): ! MOTRIN

## 2010-07-22 NOTE — Letter (Signed)
Summary: Cardiology/[facility]  Cardiology/[facility]   Imported By: Sherian Rein 05/02/2010 14:59:42  _____________________________________________________________________  External Attachment:    Type:   Image     Comment:   External Document

## 2010-07-22 NOTE — Progress Notes (Signed)
  Phone Note Other Incoming   Request: Send information Summary of Call: Records received from [facility]. 2 pages forwarded to Dr. Christella Hartigan for review.

## 2010-07-22 NOTE — Letter (Signed)
Summary: Appointment Reminder  [facility]  [address] [place], [place] [address]   Phone: [phone]  Fax: [phone]        January 31, 2010 MRN: [id number]    [facility] [address] [place], [place]  [address]    Dear Ms. Streater,   We have been unable to reach you by phone to schedule a follow up   procedure that was recommended for you by Dr. Christella Hartigan.  It is very   important that we reach you to schedule an appointment. We hope that you  allow [place] to participate in your health care needs. Please contact us at  [phone] at your earliest convenience to schedule your appointment.     Sincerely,    Chales Abrahams CMA (AAMA)  Appended Document: Appointment Reminder letter mailed

## 2010-07-22 NOTE — Progress Notes (Signed)
Summary: Record request  Request for records received from [facility]. Request forwarded to [employer]. Dena Chavis  May 29, 2009 9:50 AM

## 2010-07-22 NOTE — Progress Notes (Signed)
Summary: Questions  Phone Note Call from Patient Call back at Home Phone [phone]   Caller: Patient Call For: Dr.Jacobs Reason for Call: Talk to Nurse Summary of Call: Pt has questions about her colon for tomorrow Initial call taken by: Raechel Chute,  May 19, 2010 2:41 PM  Follow-up for Phone Call        Pt phoned in that she had not read her sheet for prep instructions correctly. She has eaten both breakfast and lunch today. Spoke with Clayborne Dana, Dr. Christella Hartigan, RN, per Dr. Christella Hartigan, pt is to begin clears now and complete her prep. Informed patient and she has no further questions. Follow-up by: Grier Rocher, RN,  May 19, 2010 2:52 PM

## 2010-07-22 NOTE — Letter (Signed)
Summary: Lipid Letter  [facility]  [address] [place], [place] [address]   Phone: [phone]  Fax: [phone]    02/20/2010  [name] [address] [place], [place]  [address]  Dear Ms. Blackie:  We have carefully reviewed your last lipid profile from  and the results are noted below with a summary of recommendations for lipid management.    Cholesterol:       224     Goal: <200   HDL "good" Cholesterol:   57.84     Goal: >50   LDL "bad" Cholesterol:   134     Goal: <100   Triglycerides:       148.0     Goal: <150        TLC Diet (Therapeutic Lifestyle Change): Saturated Fats & Transfatty acids should be kept < 7% of total calories ***Reduce Saturated Fats Polyunstaurated Fat can be up to 10% of total calories Monounsaturated Fat Fat can be up to 20% of total calories Total Fat should be no greater than 25-35% of total calories Carbohydrates should be 50-60% of total calories Protein should be approximately 15% of total calories Fiber should be at least 20-30 grams a day ***Increased fiber may help lower LDL Total Cholesterol should be < 200mg /day Consider adding plant stanol/sterols to diet (example: Benacol spread) ***A higher intake of unsaturated fat may reduce Triglycerides and Increase HDL    Adjunctive Measures (may lower LIPIDS and reduce risk of Heart Attack) include: Aerobic Exercise (20-30 minutes 3-4 times a week) Limit Alcohol Consumption Weight Reduction Aspirin 75-81 mg a day by mouth (if not allergic or contraindicated) Dietary Fiber 20-30 grams a day by mouth     Current Medications: 1)    Bayer Childrens Aspirin 81 Mg  Chew (Aspirin) .... Once daily 2)    Icaps Areds Formula  Tabs (Multiple vitamins-minerals) .... Take one tablet by mouth once daily. 3)    Multivitamins   Tabs (Multiple vitamin) .... Once daily 4)    Nexium 40mg   .... Take 1 tablet by mouth once a day 5)    Hyomax-sr 0.375 Mg Xr12h-tab (Hyoscyamine  sulfate) .... One by mouth two times a day as needed for abd bloating 6)    Bystolic 10 Mg Tabs (Nebivolol hcl) .... One by mouth once daily for high blood pressure 7)    Vytorin 10-40 Mg Tabs (Ezetimibe-simvastatin) .... One by mouth once daily for cholesterol 8)    Mobic 7.5 Mg Tabs (Meloxicam) .... One by mouth once daily as needed for knee pain  If you have any questions, please call. We appreciate being able to work with you.   Sincerely,    [facility] Etta Grandchild MD

## 2010-07-22 NOTE — Assessment & Plan Note (Signed)
Summary: [facility]   Visit Type:  Follow-up Primary Provider:  Hyacinth Meeker  CC:  palpitations.  History of Present Illness: Patient comes for follow up. Still complaining of palpitations.  NO significnat change than when last seen Claims she did not get a call to pick up event monitor.  Denies CP.  No significant SOB    Problems Prior to Update: 1)  Fibrillation, Atrial  (ICD-427.31) 2)  Hypertension  (ICD-401.9) 3)  Macular Degeneration  (ICD-362.50) 4)  Thyroid Nodule  (ICD-241.0) 5)  Fibrocystic Breast Disease  (ICD-610.1)  Medications Prior to Update: 1)  Atenolol 50 Mg  Tabs (Atenolol) .... Once Daily 2)  Ambien 5 Mg  Tabs (Zolpidem Tartrate) .... As Needed 3)  Medi-Meclizine 25 Mg  Tabs (Meclizine Hcl) .... Once Daily 4)  Bayer Childrens Aspirin 81 Mg  Chew (Aspirin) .... Once Daily 5)  Nexium 40 Mg  Cpdr (Esomeprazole Magnesium) .Marland Kitchen.. 1 Capsule Each Day 30 Minutes Before Meal 6)  Zetia 10 Mg  Tabs (Ezetimibe) .... Hs  Current Medications (verified): 1)  Atenolol 25mg   Tabs (Atenolol) .... Bid 2)  Medi-Meclizine 25 Mg  Tabs (Meclizine Hcl) .... Once Daily 3)  Bayer Childrens Aspirin 81 Mg  Chew (Aspirin) .... Once Daily 4)  Simvastatin 20mg  .... Daily 5)  Dyazide 6)  Eye Vit .... Daily  Allergies (verified): 1)  ! Motrin  Past History:  Past Medical History:    Current Problems:     FIBROCYSTIC BREAST DISEASE (ICD-610.1)    FIBRILLATION, ATRIAL (ICD-427.31)    HYPERTENSION (ICD-401.9)    MACULAR DEGENERATION (ICD-362.50)    THYROID NODULE (ICD-241.0)     (10/29/2007)  Past Surgical History:    Knee replacement 9 years ago.      (09/12/2008)  Family History:    neg for CAD    Family History of Coronary Artery Disease:      (09/12/2008)  Social History:     The patient does not smoke, does not drink.  Is a      retired Agricultural engineer.      (09/12/2008)  Risk Factors:    Alcohol Use: N/A    >5 drinks/d w/in last 3 months: N/A    Caffeine  Use: N/A    Diet: N/A    Exercise: N/A  Risk Factors:    Smoking Status: N/A    Packs/Day: N/A    Cigars/wk: N/A    Pipe Use/wk: N/A    Cans of tobacco/wk: N/A    Passive Smoke Exposure: N/A     Family History:    Reviewed history from 09/12/2008 and no changes required:       neg for CAD       Family History of Coronary Artery Disease:   Social History:    Reviewed history from 09/12/2008 and no changes required:        The patient does not smoke, does not drink.  Is a         retired Agricultural engineer.   Review of Systems  The patient denies anorexia, fever, weight loss, weight gain, vision loss, decreased hearing, hoarseness, chest pain, syncope, dyspnea on exertion, peripheral edema, prolonged cough, headaches, hemoptysis, abdominal pain, melena, hematochezia, severe indigestion/heartburn, hematuria, incontinence, genital sores, muscle weakness, suspicious skin lesions, transient blindness, difficulty walking, depression, unusual weight change, abnormal bleeding, enlarged lymph nodes, angioedema, breast masses, and testicular masses.    Vital Signs:  Patient profile:   75 year old female Height:  68 inches BP sitting:   128 / 78  (left arm)  Vitals Entered By: Burnett Kanaris, CMA (September 13, 2008 8:39 AM)  Physical Exam  General:  normal appearance.   Eyes:  PERRLA/EOM intact; conjunctiva and lids normal. Mouth:  Teeth, gums and palate normal. Oral mucosa normal. Neck:  Neck supple, no JVD. No masses, thyromegaly or abnormal cervical nodes. Lungs:  Clear bilaterally to auscultation and percussion. Heart:  Non-displaced PMI, chest non-tender; regular rate and rhythm, S1, S2 without murmurs, rubs or gallops. Carotid upstroke normal, no bruit. Normal abdominal aortic size, no bruits. Femorals normal pulses, no bruits. Pedals normal pulses. No edema, no varicosities. Abdomen:  Bowel sounds positive; abdomen soft and non-tender without masses, organomegaly, or hernias  noted. No hepatosplenomegaly. Pulses:  pulses normal in all 4 extremities Extremities:  No clubbing or cyanosis.    Impression & Recommendations:  Problem # 1:  Palpitions Will sset up for event monitor.  Call patient with results  Problem # 2:  HYPERTENSION (ICD-401.9)  Her updated medication list for this problem includes:    Bayer Childrens Aspirin 81 Mg Chew (Aspirin) ..... Once daily  Patient Instructions: 1)  Set up event monitor today. 2)  Will call patient with results.

## 2010-07-22 NOTE — Progress Notes (Signed)
Summary: can pt be clear for sugery  Phone Note Call from Patient Call back at Home Phone [phone] Call back at [phone] / [phone]   Caller: Patient Reason for Call: Talk to Nurse Details for Reason: from office notes can pt be  clearance for surgery Initial call taken by: Lorne Skeens,  March 18, 2009 3:14 PM  Follow-up for Phone Call        Called pt. and her daughter...she would like to know if Dr. Tenny Craw is ok with her having an ablation with Dr. Ladona Ridgel and does she need to come in and see Dr. Tenny Craw or Dr. Ladona Ridgel prior to procedure. Advised I would ask and get back with them. Follow-up by: Suzan Garibaldi RN  Additional Follow-up for Phone Call Additional follow up Details #1::        Does not need to see me.  Forward to Dr. Ladona Ridgel for review. Additional Follow-up by: Sherrill Raring, MD, [facility],  March 19, 2009 10:26 AM

## 2010-07-22 NOTE — Letter (Signed)
Summary: Pre Visit No Show Letter  [facility]  [address] [place], [place] [address]   Phone: [phone]  Fax: [phone]        March 10, 2010 MRN: [id number]    [name] [address] [place], [place]  [address]    Dear Ms. Salway,   We have been unable to reach you by phone concerning the pre-procedure visit that you missed on Monday 03/10/10. For this reason,your procedure scheduled on_Wednesday 03/26/10 has been cancelled. Our scheduling staff will gladly assist you with rescheduling your appointments at a more convenient time. Please call our office at [phone] between the hours of 8:00am and 5:00pm, press option #2 to reach an appointment scheduler. Please consider updating your contact numbers at this time so that we can reach you by phone in the future with schedule changes or results.    Thank you,    Doristine Church RN [facility]

## 2010-07-22 NOTE — Progress Notes (Signed)
Summary: Nuc.Pre-Procedure  Phone Note Outgoing Call Call back at Work Phone [phone] Call back at cell   Call placed by: Irean Hong, RN,  March 25, 2010 12:11 PM Summary of Call: Left message with information on [employer] Information Sheet (see scanned document for details).      Nuclear Med Background Indications for Stress Test: Evaluation for Ischemia, Post Hospital  Indications Comments: 03/19/10 Atypical CP, (-) enzymes.  History: Echo, Heart Catheterization  History Comments: 03/18/10 Echo: NL. Hx. Thoracic aorta  aneurysm 4.4 cm.  Symptoms: Chest Pain    Nuclear Pre-Procedure Cardiac Risk Factors: Hypertension, Lipids Height (in): 68

## 2010-07-22 NOTE — Assessment & Plan Note (Signed)
Summary: Cardiology Nuclear Testing  Nuclear Med Background Indications for Stress Test: Evaluation for Ischemia, Post Hospital  Indications Comments: 03/19/10 Atypical CP, (-) enzymes.  History: Echo, Heart Catheterization  History Comments: '09 Cath:OK per patient; 03/18/10 Echo:normal; Hx. Thoracic aorta  aneurysm 4.4 cm.  Symptoms: Chest Pain, Chest Pain with Exertion, Palpitations  Symptoms Comments: Last episode of WF:UXNATF, 03/21/10.   Nuclear Pre-Procedure Cardiac Risk Factors: Hypertension, Lipids Caffeine/Decaff Intake: None NPO After: 6:00 PM Lungs: Clear.  O2 Sat 96% on RA. IV 0.9% NS with Angio Cath: 22g     IV Site: R Hand IV Started by: Irean Hong, RN Chest Size (in) 38     Cup Size D     Height (in): 68 Weight (lb): 162 BMI: 24.72  Nuclear Med Study 1 or 2 day study:  1 day     Stress Test Type:  Eugenie Birks Reading MD:  Kristeen Miss, MD     Referring MD:  Berton Mount, MD Resting Radionuclide:  Technetium 27m Tetrofosmin     Resting Radionuclide Dose:  11 mCi  Stress Radionuclide:  Technetium 32m Tetrofosmin     Stress Radionuclide Dose:  33 mCi   Stress Protocol   Lexiscan: 0.4 mg   Stress Test Technologist:  Rea College, CMA-N     Nuclear Technologist:  Domenic Polite, CNMT  Rest Procedure  Myocardial perfusion imaging was performed at rest 45 minutes following the intravenous administration of Technetium 73m Tetrofosmin.  Stress Procedure  The patient received IV Lexiscan 0.4 mg over 15-seconds.  Technetium 27m Tetrofosmin injected at 30-seconds.  There were no significant changes with infusion.  Quantitative spect images were obtained after a 45 minute delay.  QPS Raw Data Images:  Normal; no motion artifact; normal heart/lung ratio. Stress Images:  There is mild apical thinning but otherwise normal uptake an all segments of the LV. Rest Images:  There is mild apical thinning but otherwise normal uptake an all segments of the LV. Subtraction  (SDS):  No evidence of ischemia. Transient Ischemic Dilatation:  0.99  (Normal <1.22)  Lung/Heart Ratio:  0.29  (Normal <0.45)  Quantitative Gated Spect Images QGS EDV:  59 ml QGS ESV:  18 ml QGS EF:  69 % QGS cine images:  Normal LV function.  Findings Low risk nuclear study      Overall Impression  BP Response: Normal blood pressure response. Clinical Symptoms: No chest pain ECG Impression: The patient developed T wave inversion after the Lexiscan infusion.  These changes resolved quickly. Overall Impression: Normal stress nuclear study. Overall Impression Comments: Normal Lexiscan nuclear study.  Normal LV systolic function.  Appended Document: Cardiology Nuclear Testing No ischemia.  Appended Document: Cardiology Nuclear Testing Patient aware of results.  Appended Document: Cardiology Nuclear Testing [facility] for call back that she can cancell appointment with Dr.Ross on 10/27 since scan was normal per PR.  Appended Document: Cardiology Nuclear Testing Patient called back...she wanted to cancell appointment for tomorrow since Dr.Ross said it was OK. She requested copy of test be mailed to her. Mailed today.

## 2010-07-22 NOTE — Progress Notes (Signed)
  Phone Note Outgoing Call   Action Taken: Phone Call Completed Summary of Call: Recieved a direct call from pt. at 12:15 and was unable to talk to pt. at that time because had another pt. in recovery so I took pt. number and told her I will pull her chart and call her back. Placed call back to pt. and she wanted to know results of procedure and why physician did not remove nodule to anal area, explained to pt. that depending on physician findings and safety of removing the nodule will determine whether or not he removes it or send her to a surgeon, pt. stated I thought he was a surgeon,explained to her that he is not and that someone from the office will contacting her concerning a surgeon refrerral pt. verbalize understanding. Initial call taken by: Greer Ee RN,  May 21, 2010 1:22 PM

## 2010-07-22 NOTE — Miscellaneous (Signed)
Summary: rx update  Clinical Lists Changes  Medications: Added new medication of ATENOLOL 50 MG  TABS (ATENOLOL) once daily Added new medication of AMBIEN 5 MG  TABS (ZOLPIDEM TARTRATE) as needed Added new medication of MEDI-MECLIZINE 25 MG  TABS (MECLIZINE HCL) once daily Added new medication of BAYER CHILDRENS ASPIRIN 81 MG  CHEW (ASPIRIN) once daily Added new medication of NEXIUM 40 MG  CPDR (ESOMEPRAZOLE MAGNESIUM) 1 capsule each day 30 minutes before meal Added new medication of ZETIA 10 MG  TABS (EZETIMIBE) hs

## 2010-07-22 NOTE — Assessment & Plan Note (Signed)
Summary: BP MEDS AREN'T WORKING/NWS   Vital Signs:  Patient profile:   75 year old female Height:      68 inches Weight:      171 pounds BMI:     26.09 O2 Sat:      96 % on Room air Temp:     97.0 degrees F oral Pulse rate:   84 / minute Pulse rhythm:   regular Resp:     16 per minute BP sitting:   134 / 92  (left arm) Cuff size:   regular  Vitals Entered By: Lanier Prude, Beverly Gust) (March 06, 2010 2:18 PM)  Nutrition Counseling: Patient's BMI is greater than 25 and therefore counseled on weight management options.  O2 Flow:  Room air CC: elevated blood pressure for 2 wks Is Patient Diabetic? No Comments pt is not taking Nexium or Mobic   Primary Care Provider:  Etta Grandchild MD  CC:  elevated blood pressure for 2 wks.  History of Present Illness:  Hypertension Follow-Up      This is a 75 year old woman who presents for Hypertension follow-up.  The patient reports edema, but denies lightheadedness, urinary frequency, headaches, rash, and fatigue.  Associated symptoms include leg edema.  The patient denies the following associated symptoms: chest pain, chest pressure, exercise intolerance, dyspnea, palpitations, and syncope.  Compliance with medications (by patient report) has been near 100%.  The patient reports that dietary compliance has been good.  The patient reports exercising occasionally.  Adjunctive measures currently used by the patient include salt restriction and relaxation.    Preventive Screening-Counseling & Management  Alcohol-Tobacco     Alcohol drinks/day: 0     Smoking Status: never     Tobacco Counseling: not indicated; no tobacco use  Hep-HIV-STD-Contraception     Hepatitis Risk: no risk noted     HIV Risk: no risk noted     STD Risk: no risk noted     SBE monthly: yes     SBE Education/Counseling: to perform regular SBE  Medications Prior to Update: 1)  Bayer Childrens Aspirin 81 Mg  Chew (Aspirin) .... Once Daily 2)  Icaps Areds  Formula  Tabs (Multiple Vitamins-Minerals) .... Take One Tablet By Mouth Once Daily. 3)  Multivitamins   Tabs (Multiple Vitamin) .... Once Daily 4)  Nexium 40mg  .... Take 1 Tablet By Mouth Once A Day 5)  Hyomax-Sr 0.375 Mg Xr12h-Tab (Hyoscyamine Sulfate) .... One By Mouth Two Times A Day As Needed For Abd Bloating 6)  Bystolic 10 Mg Tabs (Nebivolol Hcl) .... One By Mouth Once Daily For High Blood Pressure 7)  Vytorin 10-40 Mg Tabs (Ezetimibe-Simvastatin) .... One By Mouth Once Daily For Cholesterol 8)  Mobic 7.5 Mg Tabs (Meloxicam) .... One By Mouth Once Daily As Needed For Knee Pain  Current Medications (verified): 1)  Bayer Childrens Aspirin 81 Mg  Chew (Aspirin) .... Once Daily 2)  Icaps Areds Formula  Tabs (Multiple Vitamins-Minerals) .... Take One Tablet By Mouth Once Daily. 3)  Multivitamins   Tabs (Multiple Vitamin) .... Once Daily 4)  Nexium 40mg  .... Take 1 Tablet By Mouth Once A Day 5)  Hyomax-Sr 0.375 Mg Xr12h-Tab (Hyoscyamine Sulfate) .... One By Mouth Two Times A Day As Needed For Abd Bloating 6)  Bystolic 10 Mg Tabs (Nebivolol Hcl) .... One By Mouth Once Daily For High Blood Pressure 7)  Vytorin 10-40 Mg Tabs (Ezetimibe-Simvastatin) .... One By Mouth Once Daily For Cholesterol 8)  Mobic 7.5 Mg  Tabs (Meloxicam) .... One By Mouth Once Daily As Needed For Knee Pain 9)  Microzide 12.5 Mg Caps (Hydrochlorothiazide) .... One By Mouth Once Daily For High Blood Pressure  Allergies (verified): 1)  ! Motrin  Past History:  Past Medical History: Last updated: 02/20/2010 ALPITATIONS (ICD-785.1) HYPERTENSION (ICD-401.9) MACULAR DEGENERATION (ICD-362.50) THYROID NODULE (ICD-241.0) FIBROCYSTIC BREAST DISEASE (ICD-610.1)    Hyperlipidemia  Past Surgical History: Last updated: 01/31/2010 Knee  (Rt.) replacement 9 years ago.    Family History: Last updated: 01/24/2010 neg for CAD Family History of Coronary Artery Disease:  no colon cancer  Social History: Last updated:  01/31/2010  The patient does not smoke, does not drink.  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 neg for CAD Family History of Coronary Artery Disease:  no colon cancer  Social History: Reviewed history from 01/31/2010 and no changes required.  The patient does not smoke, does not drink.  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  The patient denies anorexia, fever, weight loss, weight gain, chest pain, syncope, dyspnea on exertion, prolonged cough, headaches, hemoptysis, and abdominal pain.    Physical Exam  General:  alert, well-developed, well-nourished, well-hydrated, appropriate dress, normal appearance, healthy-appearing, cooperative to examination, good hygiene, and overweight-appearing.   Mouth:  Oral mucosa and oropharynx without lesions or exudates.  Teeth in good repair. Neck:  supple, full ROM, no masses, no thyromegaly, no JVD, normal carotid upstroke, and no carotid bruits.   Lungs:  normal respiratory effort, no intercostal retractions, no accessory muscle use, normal breath sounds, no dullness, no fremitus, no crackles, and no wheezes.   Heart:  normal rate, regular rhythm, no murmur, no gallop, and no rub.   Abdomen:  soft, non-tender, normal bowel sounds, no distention, no masses, no guarding, no rigidity, no hepatomegaly, and no splenomegaly.   Msk:  right knee is mildly swollen but there is no ttp and there is good ROM with no joint laxity. Pulses:  R and L carotid,radial,femoral,dorsalis pedis and posterior tibial  pulses are full and equal bilaterally Extremities:  trace left pedal edema and trace right pedal edema.   Neurologic:  No cranial nerve deficits noted. Station and gait are normal. Plantar reflexes are down-going bilaterally. DTRs are symmetrical throughout. Sensory, motor and coordinative functions appear intact. Skin:  Intact without suspicious lesions or rashes Psych:  Cognition and judgment appear intact. Alert and cooperative with normal attention span and concentration. No apparent delusions, illusions, hallucinations   Impression & Recommendations:  Problem # 1:  HYPERTENSION (ICD-401.9) Assessment Deteriorated  Her updated medication list for this problem includes:    Bystolic 10 Mg Tabs (Nebivolol hcl) ..... One by mouth once daily for high blood pressure    Microzide 12.5 Mg Caps (Hydrochlorothiazide) ..... One by mouth once daily for high blood pressure  BP today: 134/92 Prior BP: 130/80 (02/28/2010)  Prior 10 Yr Risk Heart Disease: Not enough information (02/20/2010)  Labs Reviewed: K+: 4.2 (02/20/2010) Creat: : 0.7 (02/20/2010)   Chol: 224 (02/20/2010)   HDL: 58.10 (02/20/2010)   TG: 148.0 (02/20/2010)  Complete Medication List:  1)  Bayer Childrens Aspirin 81 Mg Chew (Aspirin) .... Once daily 2)  Icaps Areds Formula Tabs (Multiple vitamins-minerals) .... Take one tablet by mouth once daily. 3)  Multivitamins Tabs (Multiple vitamin) .... Once daily 4)  Hyomax-sr 0.375 Mg Xr12h-tab (Hyoscyamine sulfate) .... One by mouth two times a day as needed for abd bloating 5)  Bystolic 10 Mg Tabs (Nebivolol hcl) .... One by mouth once daily for high blood pressure 6)  Vytorin 10-40 Mg Tabs (Ezetimibe-simvastatin) .... One by mouth once daily for cholesterol 7)  Microzide 12.5 Mg Caps (Hydrochlorothiazide) .... One by mouth once daily for high blood pressure  Patient Instructions: 1)  Please schedule a follow-up appointment in 3 months. 2)  It is important that you exercise  regularly at least 20 minutes 5 times a week. If you develop chest pain, have severe difficulty breathing, or feel very tired , stop exercising immediately and seek medical attention. 3)  You need to lose weight. Consider a lower calorie diet and regular exercise.  4)  Check your Blood Pressure regularly. If it is above 140/90: you should make an appointment. Prescriptions: MICROZIDE 12.5 MG CAPS (HYDROCHLOROTHIAZIDE) One by mouth once daily for high blood pressure  #30 x 11   Entered and Authorized by:   Etta Grandchild MD   Signed by:   Etta Grandchild MD on 03/06/2010   Method used:   Electronically to        [facility]  [address] [id number] (retail)       [address]       [place] , [place]  [address]       Ph: [phone] or [phone]       Fax: [phone]   RxID:   [id number]

## 2010-07-22 NOTE — Miscellaneous (Signed)
Summary: [facility] Previsit  Clinical Lists Changes     Pt. recently hopitalized with atypical chest pain and has a 4.4 cm ascending aortic aneurysms and is to be scheduled for a stress test.  Procedure cancelled and pt. advised to call and make an office visit with Dr. Christella Hartigan after she's been cleared by the cardiologist.  Pt. verbalized understanding.

## 2010-07-22 NOTE — Letter (Signed)
Summary: Pre Visit Letter Revised  [facility]  [address] [place], [place] [address]   Phone: [phone]  Fax: [phone]        02/21/2010 MRN: [id number]   [name] [address] [place], [place]  [address]              Welcome to the Gastroenterology Division at [facility].    You are scheduled to see a nurse for your pre-procedure visit on February 26, 2010 at 4:30pm on the 3rd floor at [facility], [address]  We ask that you try to arrive at our office 15 minutes prior to your appointment time to allow for check-in.  Please take a minute to review the attached form.  If you answer "Yes" to one or more of the questions on the first page, we ask that you call the person listed at your earliest opportunity.  If you answer "No" to all of the questions, please complete the rest of the form and bring it to your appointment.    Your nurse visit will consist of discussing your medical and surgical history, your immediate family medical history, and your medications.   If you are unable to list all of your medications on the form, please bring the medication bottles to your appointment and we will list them.  We will need to be aware of both prescribed and over the counter drugs.  We will need to know exact dosage information as well.    Please be prepared to read and sign documents such as consent forms, a financial agreement, and acknowledgement forms.  If necessary, and with your consent, a friend or relative is welcome to sit-in on the nurse visit with you.  Please bring your insurance card so that we may make a copy of it.  If your insurance requires a referral to see a specialist, please bring your referral form from your primary care physician.  No co-pay is required for this nurse visit.     If you cannot keep your appointment, please call [phone] to cancel or reschedule prior to your appointment date.  This allows [place] the  opportunity to schedule an appointment for another patient in need of care.    Thank you for choosing [facility] for your medical needs.  We appreciate the opportunity to care for you.  Please visit [place] at our website  to learn more about our practice.  Sincerely, The Gastroenterology Division

## 2010-07-22 NOTE — Assessment & Plan Note (Signed)
Summary: NEW/ MEDICARE/ [place]  #   Vital Signs:  Patient profile:   75 year old female Height:      68 inches Weight:      166.50 pounds BMI:     25.41 O2 Sat:      97 % on Room air Temp:     98.0 degrees F oral Pulse rate:   66 / minute Pulse rhythm:   regular Resp:     16 per minute BP sitting:   120 / 80  (left arm) Cuff size:   large  Vitals Entered By: Rock Nephew CMA (January 31, 2010 2:56 PM)  O2 Flow:  Room air CC: new to establish, Abdominal pain Is Patient Diabetic? No Pain Assessment Patient in pain? no       Does patient need assistance? Functional Status Self care Ambulation Normal   Primary Care Provider:  Etta Grandchild MD  CC:  new to establish and Abdominal pain.  History of Present Illness:  Abdominal Pain      This is a 75 year old woman who presents with Abdominal pain.  The symptoms began >1 year ago.  On a scale of 1 to 10, the intensity is described as a 2.  The patient reports diarrhea and constipation, but denies nausea, vomiting, melena, hematochezia, anorexia, and hematemesis.  The location of the pain is diffuse.  The pain is described as intermittent and dull with bloating.  The patient denies the following symptoms: fever, weight loss, dysuria, chest pain, jaundice, dark urine, and vaginal bleeding.  The pain is better with defecation.  The pain is somewhat better with Nexium.  Also, she has noticed the gradual development of pain and swelling in her right knee with no preceding trauma or injury.    Preventive Screening-Counseling & Management  Alcohol-Tobacco     Alcohol drinks/day: 0     Smoking Status: never  Caffeine-Diet-Exercise     Does Patient Exercise: no  Hep-HIV-STD-Contraception     Hepatitis Risk: no risk noted     HIV Risk: no risk noted     STD Risk: no risk noted     SBE monthly: yes     SBE Education/Counseling: to perform regular SBE  Safety-Violence-Falls     Seat Belt Use: yes     Helmet  Use: yes     Firearms in the Home: no firearms in the home     Smoke Detectors: yes     Violence in the Home: no risk noted      Sexual History:  not active.        Drug Use:  no.        Blood Transfusions:  no.    Clinical Review Panels:  Prevention   Last Mammogram:  normal (08/20/2009)   Last Colonoscopy:  done (06/22/2008)  Immunizations   Last Tetanus Booster:  Historical (06/22/2009)   Last Pneumovax:  given (06/22/2002)  Diabetes Management   Creatinine:  0.8 (01/24/2010)   Last Pneumovax:  given (06/22/2002)  CBC   WBC:  5.7 (01/24/2010)   RBC:  3.41 (01/24/2010)   Hgb:  11.5 (01/24/2010)   Hct:  33.4 (01/24/2010)   Platelets:  185.0 (01/24/2010)   MCV  98.2 (01/24/2010)   MCHC  34.5 (01/24/2010)   RDW  13.4 (01/24/2010)   PMN:  50.6 (01/24/2010)   Lymphs:  37.5 (01/24/2010)   Monos:  7.6 (01/24/2010)   Eosinophils:  3.8 (01/24/2010)   Basophil:  0.5 (01/24/2010)  Complete Metabolic Panel   Glucose:  79 (01/24/2010)   Sodium:  139 (01/24/2010)   Potassium:  4.0 (01/24/2010)   Chloride:  101 (01/24/2010)   CO2:  31 (01/24/2010)   BUN:  10 (01/24/2010)   Creatinine:  0.8 (01/24/2010)   Albumin:  4.2 (01/24/2010)   Total Protein:  7.3 (01/24/2010)   Calcium:  9.2 (01/24/2010)   Total Bili:  0.7 (01/24/2010)   Alk Phos:  62 (01/24/2010)   SGPT (ALT):  14 (01/24/2010)   SGOT (AST):  18 (01/24/2010)   Medications Prior to Update: 1)  Bayer Childrens Aspirin 81 Mg  Chew (Aspirin) .... Once Daily 2)  Atenolol 50 Mg Tabs (Atenolol) .... One Tablet By Mouth Once Daily 3)  Icaps Areds Formula  Tabs (Multiple Vitamins-Minerals) .... Take One Tablet By Mouth Once Daily. 4)  Multivitamins   Tabs (Multiple Vitamin) .... Once Daily  Current Medications (verified): 1)  Bayer Childrens Aspirin 81 Mg  Chew (Aspirin) .... Once Daily 2)  Atenolol 50 Mg Tabs (Atenolol) .... One Tablet By Mouth Once Daily 3)  Icaps Areds Formula  Tabs (Multiple Vitamins-Minerals)  .... Take One Tablet By Mouth Once Daily. 4)  Multivitamins   Tabs (Multiple Vitamin) .... Once Daily 5)  Nexium 40mg  .... Take 1 Tablet By Mouth Once A Day 6)  Levbid 0.375 Mg Xr12h-Tab (Hyoscyamine Sulfate) .... One By Mouth Two Times A Day As Needed For Abd Pain and Bloating  Allergies (verified): 1)  ! Motrin  Past History:  Past Medical History: Last updated: 01/24/2010 ALPITATIONS (ICD-785.1) HYPERTENSION (ICD-401.9) MACULAR DEGENERATION (ICD-362.50) THYROID NODULE (ICD-241.0) FIBROCYSTIC BREAST DISEASE (ICD-610.1)     Family History: Last updated: 01/24/2010 neg for CAD Family History of Coronary Artery Disease:  no colon cancer  Social History: Last updated: 01/31/2010  The patient does not smoke, does not drink.  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    Family History: Reviewed history from 01/24/2010 and no changes required. neg for CAD Family History of Coronary Artery Disease:  no colon cancer  Social History: Reviewed history from 01/24/2010 and no changes required.  The patient does not smoke, does not drink.  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  The patient denies anorexia, fever, weight loss, chest pain, syncope, peripheral edema, prolonged cough, headaches, hemoptysis, melena,  hematochezia, severe indigestion/heartburn, hematuria, difficulty walking, depression, enlarged lymph nodes, and angioedema.   MS:  Complains of joint pain, joint swelling, and stiffness; denies joint redness, loss of strength, low back pain, muscle, cramps, and muscle weakness.  Physical Exam  General:  alert, well-developed, well-nourished, well-hydrated, appropriate dress, normal appearance, healthy-appearing, cooperative to examination, good hygiene, and overweight-appearing.   Head:  normocephalic, atraumatic, no abnormalities observed, and no abnormalities palpated.   Eyes:  vision grossly intact, pupils equal, and pupils round.  Ears:  R ear normal and L ear normal.   Mouth:  Oral mucosa and oropharynx without lesions or exudates.  Teeth in good repair. Neck:  supple, full ROM, no masses, no thyromegaly, no JVD, normal carotid upstroke, no carotid bruits, and no cervical lymphadenopathy.   Lungs:  normal respiratory effort, no intercostal retractions, no accessory muscle use, normal breath sounds, no dullness, no fremitus, no crackles, and no wheezes.   Heart:  normal rate, regular rhythm, no murmur, no gallop, no rub, and no JVD.   Abdomen:  soft, non-tender, normal bowel sounds, no distention, no masses, no guarding, no rigidity, no rebound tenderness, no abdominal hernia, no inguinal hernia, no hepatomegaly, and no splenomegaly.   Msk:  right knee is mildly swollen but there is no ttp and there is good ROM with no joint laxity. Pulses:  R and L carotid,radial,femoral,dorsalis pedis and posterior tibial pulses are full and equal bilaterally Extremities:  No clubbing, cyanosis, edema, or deformity noted with normal full range of motion of all joints.   Neurologic:  No cranial nerve deficits noted. Station and gait are normal. Plantar reflexes are down-going bilaterally. DTRs are symmetrical throughout. Sensory, motor and coordinative functions appear intact. Skin:  Intact without  suspicious lesions or rashes Cervical Nodes:  No lymphadenopathy noted Psych:  Cognition and judgment appear intact. Alert and cooperative with normal attention span and concentration. No apparent delusions, illusions, hallucinations   Impression & Recommendations:  Problem # 1:  KNEE PAIN, ACUTE (EAV-409.81) Assessment New  Her updated medication list for this problem includes:    Bayer Childrens Aspirin 81 Mg Chew (Aspirin) ..... Once daily  Orders: T-Knee Comp Right 4 Views [phone])  Problem # 2:  ABDOMINAL PAIN OTHER SPECIFIED SITE (ICD-789.09) this sounds like IBS so will start Levbid as needed and follow Her updated medication list for this problem includes:    Bayer Childrens Aspirin 81 Mg Chew (Aspirin) ..... Once daily  Problem # 3:  HYPERTENSION (ICD-401.9) Assessment: Improved  Her updated medication list for this problem includes:    Atenolol 50 Mg Tabs (Atenolol) ..... One tablet by mouth once daily  BP today: 120/80 Prior BP: 128/72 (01/24/2010)  Labs Reviewed: K+: 4.0 (01/24/2010) Creat: : 0.8 (01/24/2010)     Problem # 4:  JOINT EFFUSION, RIGHT KNEE (ICD-719.06) Assessment: New  Orders: Orthopedic Referral (Ortho)  Complete Medication List: 1)  Bayer Childrens Aspirin 81 Mg Chew (Aspirin) .... Once daily 2)  Atenolol 50 Mg Tabs (Atenolol) .... One tablet by mouth once daily 3)  Icaps Areds Formula Tabs (Multiple vitamins-minerals) .... Take one tablet by mouth once daily. 4)  Multivitamins Tabs (Multiple vitamin) .... Once daily 5)  Nexium 40mg   .... Take 1 tablet by mouth once a day 6)  Levbid 0.375 Mg Xr12h-tab (Hyoscyamine sulfate) .... One by mouth two times a day as needed for abd pain and bloating  Patient Instructions: 1)  Please schedule a follow-up appointment in 2 months. Prescriptions: LEVBID 0.375 MG XR12H-TAB (HYOSCYAMINE SULFATE) One by mouth two times a day as needed for abd pain and bloating  #60 x 3   Entered and Authorized by:    Etta Grandchild MD   Signed by:   Etta Grandchild MD on 01/31/2010   Method used:   Print then Give to Patient   RxID:   [id number]   Preventive Care Screening  Mammogram:    Date:  08/20/2009    Results:  normal   Last Tetanus  Booster:    Date:  06/22/2009    Results:  Historical   Colonoscopy:    Date:  06/22/2008    Results:  done   Bone Density:    Date:  06/23/2007    Results:  done std dev  Last Pneumovax:    Date:  06/22/2002    Results:  given

## 2010-07-22 NOTE — Progress Notes (Signed)
Summary: HEART BEATING FAST  Phone Note Call from Patient Call back at Home Phone [phone] Call back at [phone]   Caller: Patient Reason for Call: Talk to Nurse Summary of Call: MONITOR PUT UP A DIFFERENT READING, HAD SAME  FEELING TODAY, HEART BEATING ALOT Initial call taken by: Lorne Skeens,  October 17, 2008 2:27 PM  Follow-up for Phone Call        Called pt. Set up for Ep consult.      Appended Document: HEART BEATING FAST Agree with EP referral

## 2010-07-22 NOTE — Assessment & Plan Note (Signed)
Summary: knee swelling/needs to see pcp per ortho/SD   Vital Signs:  Patient profile:   75 year old female Height:      68 inches Weight:      167 pounds O2 Sat:      16 % Temp:     98.0 degrees F oral Pulse rate:   68 / minute Pulse rhythm:   regular Resp:     16 per minute BP sitting:   148 / 98  (left arm) Cuff size:   regular  Vitals Entered By: Lanier Prude, CMA(AAMA) (February 20, 2010 8:17 AM) CC: Rt kne pain/swelling X 1-2 wks, Abdominal Pain, Hypertension Management Is Patient Diabetic? No   Primary Care Provider:  Etta Grandchild MD  CC:  Rt kne pain/swelling X 1-2 wks, Abdominal Pain, and Hypertension Management.  History of Present Illness: she returns with several issues 1. she has not seen ortho yet and has right knee pain and swelling, she does not want me to inject the knee, she will not take celebrex b/c she has heard about side effects 2. she was not able to afford Levbid, she wants a generic option, she had heartburn and an occasional spasms under her sternum that radiates to the right side of her chest 3. she requests a referral to GI to see about the mass found in her rectum on colonoscopy in 2009 4. she wants to start treating her cholesterol, she took lipitor before and did not have a problem with it and she is not sure why she stopped taking it   Dyspepsia History:      She has no alarm features of dyspepsia including no history of melena, hematochezia, dysphagia, persistent vomiting, or involuntary weight loss > 5%.  The patient does not have a prior history of documented ulcer disease.  The dominant symptom is heartburn or acid reflux.  An H-2 blocker medication is currently being taken.  She notes that the symptoms have improved with the H-2 blocker therapy.  Symptoms have persisted after 4 weeks of H-2 blocker treatment.    Hypertension History:      She denies headache, chest pain, palpitations, dyspnea with exertion, orthopnea, PND, peripheral  edema, visual symptoms, neurologic problems, syncope, and side effects from treatment.  She notes no problems with any antihypertensive medication side effects.        Positive major cardiovascular risk factors include female age 31 years old or older, hyperlipidemia, and hypertension.  Negative major cardiovascular risk factors include no history of diabetes, negative family history for ischemic heart disease, and non-tobacco-user status.        Further assessment for target organ damage reveals no history of ASHD, cardiac end-organ damage (CHF/LVH), stroke/TIA, peripheral vascular disease, renal insufficiency, or hypertensive retinopathy.      Preventive Screening-Counseling & Management  Alcohol-Tobacco     Alcohol drinks/day: 0     Smoking Status: never     Tobacco Counseling: not indicated; no tobacco use  Current Medications (verified): 1)  Bayer Childrens Aspirin 81 Mg  Chew (Aspirin) .... Once Daily 2)  Atenolol 50 Mg Tabs (Atenolol) .... One Tablet By Mouth Once Daily 3)  Icaps Areds Formula  Tabs (Multiple Vitamins-Minerals) .... Take One Tablet By Mouth Once Daily. 4)  Multivitamins   Tabs (Multiple Vitamin) .... Once Daily 5)  Nexium 40mg  .... Take 1 Tablet By Mouth Once A Day 6)  Levbid 0.375 Mg Xr12h-Tab (Hyoscyamine Sulfate) .... One By Mouth Two Times A Day As  Needed For Abd Pain and Bloating  Allergies (verified): 1)  ! Motrin  Past History:  Past Medical History: ALPITATIONS (ICD-785.1) HYPERTENSION (ICD-401.9) MACULAR DEGENERATION (ICD-362.50) THYROID NODULE (ICD-241.0) FIBROCYSTIC BREAST DISEASE (ICD-610.1)    Hyperlipidemia  Family History: Reviewed history from 01/24/2010 and no changes required. neg for CAD Family History of Coronary Artery Disease:  no colon cancer  Social History: Reviewed history from 01/31/2010 and no changes required.  The patient does not smoke, does not drink.  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  The patient denies anorexia, fever, weight loss, chest pain, syncope, dyspnea on exertion, peripheral edema, prolonged cough, headaches, hemoptysis, abdominal pain, melena, hematochezia, hematuria, difficulty walking, depression, and enlarged lymph nodes.   CV:  Denies chest pain or discomfort, fainting, fatigue, leg cramps with exertion, lightheadness, near fainting, palpitations, shortness of breath with exertion, swelling of feet, swelling of hands, and weight gain. MS:  Complains of joint pain, joint swelling, and stiffness; denies joint redness, loss of strength, low back pain, mid back pain, and muscle aches.  Physical Exam  General:  alert, well-developed, well-nourished, well-hydrated, appropriate dress, normal appearance, healthy-appearing, cooperative to examination, good hygiene, and overweight-appearing.   Head:  normocephalic, atraumatic, no abnormalities observed, and no abnormalities palpated.   Mouth:  Oral mucosa and oropharynx without lesions or exudates.  Teeth in good repair. Neck:  supple, full ROM, no masses, no thyromegaly, no JVD, normal carotid upstroke, and no carotid bruits.   Lungs:  normal respiratory effort, no intercostal retractions, no accessory muscle use, normal breath sounds, no dullness, no fremitus, no crackles, and no wheezes.   Heart:  normal rate, regular rhythm, no murmur, no gallop, and no rub.   Abdomen:  soft, non-tender, normal bowel sounds, no distention, no masses, no guarding, no rigidity, no hepatomegaly, and no splenomegaly.   Msk:  right knee is mildly swollen but there is no ttp and there is good ROM with no joint laxity. Pulses:  R and L carotid,radial,femoral,dorsalis pedis and posterior tibial pulses are full and equal bilaterally Extremities:  No clubbing,  cyanosis, edema, or deformity noted with normal full range of motion of all joints.   Neurologic:  No cranial nerve deficits noted. Station and gait are normal. Plantar reflexes are down-going bilaterally. DTRs are symmetrical throughout. Sensory, motor and coordinative functions appear intact. Skin:  Intact without suspicious lesions or rashes Psych:  Cognition and judgment appear intact. Alert and cooperative with normal attention span and concentration. No apparent delusions, illusions, hallucinations   Impression & Recommendations:  Problem # 1:  HYPERLIPIDEMIA (ICD-272.4) Assessment New  Her updated medication list for this problem includes:    Vytorin 10-40 Mg Tabs (Ezetimibe-simvastatin) ..... One by mouth once daily for cholesterol  Orders: Venipuncture (91478) TLB-Lipid Panel (80061-LIPID) TLB-Hepatic/Liver Function Pnl (80076-HEPATIC) TLB-TSH (Thyroid Stimulating Hormone) (84443-TSH) TLB-BMP (Basic Metabolic Panel-BMET) (80048-METABOL)  Problem # 2:  JOINT EFFUSION, RIGHT KNEE (ICD-719.06) Assessment: Unchanged  Problem # 3:  KNEE PAIN, ACUTE (ICD-719.46) Assessment: Unchanged  Her updated medication list for this problem includes:    Bayer Childrens Aspirin 81 Mg Chew (Aspirin) ..... Once daily    Mobic 7.5 Mg Tabs (Meloxicam) ..... One by mouth once daily as needed for knee pain  Problem # 4:  DYSPEPSIA (ICD-536.8) Assessment: Unchanged  Problem #  5:  PALPITATIONS (ICD-785.1) Assessment: Improved  The following medications were removed from the medication list:    Atenolol 50 Mg Tabs (Atenolol) ..... One tablet by mouth once daily Her updated medication list for this problem includes:    Bystolic 10 Mg Tabs (Nebivolol hcl) ..... One by mouth once daily for high blood pressure  Problem # 6:  HYPERTENSION (ICD-401.9) Assessment: Deteriorated  The following medications were removed from the medication list:    Atenolol 50 Mg Tabs (Atenolol) ..... One tablet by  mouth once daily Her updated medication list for this problem includes:    Bystolic 10 Mg Tabs (Nebivolol hcl) ..... One by mouth once daily for high blood pressure  Orders: Venipuncture (16109) TLB-Lipid Panel (80061-LIPID) TLB-Hepatic/Liver Function Pnl (80076-HEPATIC) TLB-TSH (Thyroid Stimulating Hormone) (84443-TSH) TLB-BMP (Basic Metabolic Panel-BMET) (80048-METABOL)  BP today: 148/98 Prior BP: 120/80 (01/31/2010)  10 Yr Risk Heart Disease: Not enough information  Labs Reviewed: K+: 4.0 (01/24/2010) Creat: : 0.8 (01/24/2010)     Complete Medication List: 1)  Bayer Childrens Aspirin 81 Mg Chew (Aspirin) .... Once daily 2)  Icaps Areds Formula Tabs (Multiple vitamins-minerals) .... Take one tablet by mouth once daily. 3)  Multivitamins Tabs (Multiple vitamin) .... Once daily 4)  Nexium 40mg   .... Take 1 tablet by mouth once a day 5)  Hyomax-sr 0.375 Mg Xr12h-tab (Hyoscyamine sulfate) .... One by mouth two times a day as needed for abd bloating 6)  Bystolic 10 Mg Tabs (Nebivolol hcl) .... One by mouth once daily for high blood pressure 7)  Vytorin 10-40 Mg Tabs (Ezetimibe-simvastatin) .... One by mouth once daily for cholesterol 8)  Mobic 7.5 Mg Tabs (Meloxicam) .... One by mouth once daily as needed for knee pain  Other Orders: Gastroenterology Referral (GI)  Hypertension Assessment/Plan:      The patient's hypertensive risk group is category B: At least one risk factor (excluding diabetes) with no target organ damage.  Today's blood pressure is 148/98.    Patient Instructions: 1)  Please schedule a follow-up appointment in 2 months. 2)  It is important that you exercise regularly at least 20 minutes 5 times a week. If you develop chest pain, have severe difficulty breathing, or feel very tired , stop exercising immediately and seek medical attention. 3)  You need to lose weight. Consider a lower calorie diet and regular exercise.  4)  Schedule a colonoscopy/sigmoidoscopy  to help detect colon cancer. 5)  Check your Blood Pressure regularly. If it is above 140/90: you should make an appointment. Prescriptions: VYTORIN 10-40 MG TABS (EZETIMIBE-SIMVASTATIN) One by mouth once daily for cholesterol  #112 x 0   Entered and Authorized by:   Etta Grandchild MD   Signed by:   Etta Grandchild MD on 02/20/2010   Method used:   Samples Given   RxID:   [id number] MOBIC 7.5 MG TABS (MELOXICAM) One by mouth once daily as needed for knee pain  #30 x 11   Entered and Authorized by:   Etta Grandchild MD   Signed by:   Etta Grandchild MD on 02/20/2010   Method used:   Print then Give to Patient   RxID:   [id number] HYOMAX-SR 0.375 MG XR12H-TAB (HYOSCYAMINE SULFATE) One by mouth two times a day as needed for abd bloating  #60 x 5   Entered and Authorized by:   Etta Grandchild MD   Signed by:   Etta Grandchild MD on 02/20/2010   Method used:  Print then Give to Patient   RxID:   [id number] LIPITOR 40 MG TABS (ATORVASTATIN CALCIUM) One by mouth once daily for cholesterol  #84 x 0   Entered and Authorized by:   Etta Grandchild MD   Signed by:   Etta Grandchild MD on 02/20/2010   Method used:   Samples Given   RxID:   [id number] BYSTOLIC 10 MG TABS (NEBIVOLOL HCL) One by mouth once daily for high blood pressure  #105 x 0   Entered and Authorized by:   Etta Grandchild MD   Signed by:   Etta Grandchild MD on 02/20/2010   Method used:   Samples Given   RxID:   [id number]

## 2010-07-22 NOTE — Progress Notes (Signed)
Summary: CALL   Phone Note Call from Patient Call back at Work Phone [phone]   Summary of Call: Patient is requesting a call back about her bp med.  Initial call taken by: Lamar Sprinkles, CMA,  March 04, 2010 5:56 PM  Follow-up for Phone Call        left mess to call office back.................Marland KitchenLamar Sprinkles, CMA  March 05, 2010 5:36 PM  Left meeage to call back...Marland KitchenMarland KitchenCristy Hilts, RN  March 07, 2010 5:00 PM  Reviewed chart pt was seen 03-06-10 by Dr. Yetta Barre for her BP Cristy Hilts, RN  March 07, 2010 5:02 PM

## 2010-07-22 NOTE — Procedures (Signed)
Summary: Colonoscopy/[facility]  Colonoscopy/[facility]   Imported By: Sherian Rein 02/13/2010 14:12:38  _____________________________________________________________________  External Attachment:    Type:   Image     Comment:   External Document

## 2010-07-22 NOTE — Letter (Signed)
Summary: Pre Visit Letter Revised  [facility]  [address] [place], [place] [address]   Phone: [phone]  Fax: [phone]        02/28/2010 MRN: [id number] [name] [address] [place], [place]  [address]             Procedure Date:  03/26/2010   Welcome to the Gastroenterology Division at [facility].    You are scheduled to see a nurse for your pre-procedure visit on 03/10/2010 at 3:30PM on the 3rd floor at [facility], [address]  We ask that you try to arrive at our office 15 minutes prior to your appointment time to allow for check-in.  Please take a minute to review the attached form.  If you answer "Yes" to one or more of the questions on the first page, we ask that you call the person listed at your earliest opportunity.  If you answer "No" to all of the questions, please complete the rest of the form and bring it to your appointment.    Your nurse visit will consist of discussing your medical and surgical history, your immediate family medical history, and your medications.   If you are unable to list all of your medications on the form, please bring the medication bottles to your appointment and we will list them.  We will need to be aware of both prescribed and over the counter drugs.  We will need to know exact dosage information as well.    Please be prepared to read and sign documents such as consent forms, a financial agreement, and acknowledgement forms.  If necessary, and with your consent, a friend or relative is welcome to sit-in on the nurse visit with you.  Please bring your insurance card so that we may make a copy of it.  If your insurance requires a referral to see a specialist, please bring your referral form from your primary care physician.  No co-pay is required for this nurse visit.     If you cannot keep your appointment, please call [phone] to cancel or reschedule prior to your appointment date.   This allows [place] the opportunity to schedule an appointment for another patient in need of care.    Thank you for choosing [facility] for your medical needs.  We appreciate the opportunity to care for you.  Please visit [place] at our website  to learn more about our practice.  Sincerely, The Gastroenterology Division

## 2010-07-22 NOTE — Progress Notes (Signed)
Summary: EP Consult  TC to pt. trying to set up pt for Ep consult and still no answer at home and no answering machine.  Pt. called, she states we have not been able to reach her because she stays at her daughter's house all the time.... her phone #is [phone]..I advised her that based on the findings of her event monitor per Dr. Tenny Craw she needs to see Dr. Ladona Ridgel for an EP consult.Yakima Budzinski.agreed and Pcc will set up apt.

## 2010-07-22 NOTE — Assessment & Plan Note (Signed)
Summary: DISCUSS MEDS/NWS   Vital Signs:  Patient profile:   75 year old female Menstrual status:  postmenopausal Height:      68 inches Weight:      167 pounds BMI:     25.48 O2 Sat:      96 % on Room air Temp:     97.5 degrees F oral Pulse rate:   63 / minute Pulse rhythm:   regular Resp:     16 per minute BP sitting:   128 / 68  (left arm) Cuff size:   large  Vitals Entered By: Rock Nephew CMA (May 30, 2010 12:48 PM)  Nutrition Counseling: Patient's BMI is greater than 25 and therefore counseled on weight management options.  O2 Flow:  Room air CC: follow-up visit//discuss meds, Hypertension Management, Lipid Management Is Patient Diabetic? No Pain Assessment Patient in pain? no       Does patient need assistance? Functional Status Self care Ambulation Normal   Primary Care Provider:  Etta Grandchild MD  CC:  follow-up visit//discuss meds, Hypertension Management, and Lipid Management.  History of Present Illness:  Follow-Up Visit      This is a 75 year old woman who presents for Follow-up visit.  The patient denies chest pain, palpitations, dizziness, syncope, edema, SOB, DOE, PND, and orthopnea.  Since the last visit the patient notes problems with medications - she does not like the taste of Microzide.  The patient reports taking meds as prescribed, monitoring BP, and dietary compliance.  When questioned about possible medication side effects, the patient notes none.    Hypertension History:      She denies headache, chest pain, palpitations, dyspnea with exertion, orthopnea, PND, peripheral edema, visual symptoms, neurologic problems, syncope, and side effects from treatment.  She notes no problems with any antihypertensive medication side effects.        Positive major cardiovascular risk factors include female age 84 years old or older, hyperlipidemia, and hypertension.  Negative major cardiovascular risk factors include no history of diabetes, negative  family history for ischemic heart disease, and non-tobacco-user status.        Positive history for target organ damage include peripheral vascular disease.  Further assessment for target organ damage reveals no history of ASHD, cardiac end-organ damage (CHF/LVH), stroke/TIA, renal insufficiency, or hypertensive retinopathy.    Lipid Management History:      Positive NCEP/ATP III risk factors include female age 4 years old or older, hypertension, peripheral vascular disease, and aortic aneurysm.  Negative NCEP/ATP III risk factors include non-diabetic, no family history for ischemic heart disease, non-tobacco-user status, no ASHD (atherosclerotic heart disease), and no prior stroke/TIA.        The patient states that she knows about the "Therapeutic Lifestyle Change" diet.  Her compliance with the TLC diet is fair.  Adjunctive measures started by the patient include aerobic exercise, fiber, limit alcohol consumpton, and weight reduction.  She notes side effects from her lipid-lowering medication.  The patient notes symptoms to suggest myopathy or liver disease.      Current Medications (verified): 1)  Bayer Childrens Aspirin 81 Mg  Chew (Aspirin) .... Once Daily 2)  Icaps Areds Formula  Tabs (Multiple Vitamins-Minerals) .... Take One Tablet By Mouth Once Daily. 3)  Multivitamins   Tabs (Multiple Vitamin) .... Once Daily 4)  Bystolic 10 Mg Tabs (Nebivolol Hcl) .... One By Mouth Once Daily For High Blood Pressure 5)  Microzide 12.5 Mg Caps (Hydrochlorothiazide) .... One By  Mouth Once Daily For High Blood Pressure 6)  Crestor 5 Mg Tabs (Rosuvastatin Calcium) .... One By Mouth Once Daily  Allergies (verified): 1)  ! Motrin 2)  ! Crestor (Rosuvastatin Calcium)  Past History:  Past Medical History: Last updated: 02/20/2010 ALPITATIONS (ICD-785.1) HYPERTENSION (ICD-401.9) MACULAR DEGENERATION (ICD-362.50) THYROID NODULE (ICD-241.0) FIBROCYSTIC BREAST DISEASE (ICD-610.1)     Hyperlipidemia  Past Surgical History: Last updated: 01/31/2010 Knee  (Rt.) replacement 9 years ago.    Family History: Last updated: 01/24/2010 neg for CAD Family History of Coronary Artery Disease:  no colon cancer  Social History: Last updated: 01/31/2010  The patient does not smoke, does not drink.  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 neg for CAD Family History of Coronary Artery Disease:  no colon cancer  Social History: Reviewed history from 01/31/2010 and no changes required.  The patient does not smoke, does not drink.  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  The patient denies anorexia, fever, weight loss, chest pain, syncope, dyspnea on exertion, peripheral edema, prolonged cough, headaches, hemoptysis, abdominal pain, muscle weakness, suspicious skin lesions, difficulty walking, and angioedema.   MS:  Complains of muscle aches; denies joint pain, joint redness, joint swelling, loss of strength, low back pain, muscle weakness, stiffness, and thoracic pain.  Physical Exam  General:  alert, well-developed, well-nourished, well-hydrated, appropriate dress, normal appearance, healthy-appearing, cooperative to examination, good hygiene, and overweight-appearing.   Head:  normocephalic, atraumatic, no abnormalities observed, and no abnormalities palpated.   Mouth:  Oral mucosa and oropharynx without lesions or exudates.  Teeth in good repair. Neck:  supple, full ROM, no masses, no thyromegaly, no thyroid nodules or  tenderness, no JVD, normal carotid upstroke, no carotid bruits, no cervical lymphadenopathy, and no neck tenderness.   Lungs:  Normal respiratory effort, chest expands symmetrically. Lungs are clear to auscultation, no crackles or wheezes. Heart:  Normal rate and regular rhythm. S1 and S2 normal without gallop, murmur, click, rub or other extra sounds. Abdomen:  soft, non-tender, normal bowel sounds, no distention, no masses, no guarding, no rigidity, no hepatomegaly, and no splenomegaly.   Msk:  normal ROM, no joint tenderness, no joint swelling, no joint warmth, no redness over joints, no joint deformities, no joint instability, no crepitation, and no muscle atrophy.   Pulses:  R and L carotid,radial,femoral,dorsalis pedis and posterior tibial pulses are full and equal bilaterally Extremities:  No clubbing, cyanosis, edema, or deformity noted with normal full range of motion of all joints.   Neurologic:  No cranial nerve deficits noted. Station and gait are normal. Plantar reflexes are down-going bilaterally. DTRs are symmetrical throughout. Sensory, motor and coordinative functions appear intact. Skin:  turgor normal, color normal, no rashes, no suspicious lesions, no ecchymoses, no petechiae, no purpura, no ulcerations, and no edema.   Cervical Nodes:  no anterior cervical adenopathy and no posterior cervical adenopathy.   Axillary Nodes:  no R axillary adenopathy and no L axillary adenopathy.   Inguinal Nodes:  no R inguinal adenopathy and no L inguinal adenopathy.   Psych:  Cognition and  judgment appear intact. Alert and cooperative with normal attention span and concentration. 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 Once daily  Orders: Venipuncture (11914) TLB-Lipid Panel (80061-LIPID) TLB-BMP (Basic Metabolic Panel-BMET) (80048-METABOL) TLB-CBC Platelet - w/Differential (85025-CBCD) TLB-Hepatic/Liver Function Pnl (80076-HEPATIC) TLB-TSH (Thyroid Stimulating Hormone) (84443-TSH) TLB-CK Total Only(Creatine Kinase/CPK) (82550-CK)  Problem # 3:  HYPERLIPIDEMIA (ICD-272.4) Assessment: Deteriorated  The following medications were removed from the medication list:    Crestor 5 Mg Tabs (Rosuvastatin calcium) ..... One by mouth once daily  Orders: Venipuncture (78295) TLB-Lipid Panel (80061-LIPID) TLB-BMP (Basic Metabolic Panel-BMET) (80048-METABOL) TLB-CBC Platelet - w/Differential (85025-CBCD) TLB-Hepatic/Liver Function Pnl (80076-HEPATIC) TLB-TSH (Thyroid Stimulating Hormone) (84443-TSH) TLB-CK Total Only(Creatine Kinase/CPK) (82550-CK)  Labs Reviewed: SGOT: 16 (02/20/2010)   SGPT: 12 (02/20/2010)  Lipid Goals: Chol Goal: 200 (05/30/2010)   HDL Goal: 40 (05/30/2010)   LDL Goal: 100 (05/30/2010)   TG Goal: 150 (05/30/2010)  Prior 10 Yr Risk Heart Disease: Not enough information (02/20/2010)   HDL:58.10 (02/20/2010)  Chol:224 (02/20/2010)  Trig:148.0 (02/20/2010)  Problem # 4:  HYPERTENSION (ICD-401.9) Assessment: Improved  The following medications were removed from the medication list:    Microzide 12.5 Mg Caps (Hydrochlorothiazide) ..... One by mouth once daily for high blood pressure Her updated medication list for this problem includes:    Bystolic 10 Mg Tabs (Nebivolol hcl) ..... One by mouth once daily for high blood pressure    Dyazide 37.5-25 Mg Caps (Triamterene-hctz) ..... One by mouth once daily for high blood pressure  Orders: Venipuncture (62130) TLB-Lipid Panel (80061-LIPID) TLB-BMP (Basic Metabolic Panel-BMET) (80048-METABOL) TLB-CBC  Platelet - w/Differential (85025-CBCD) TLB-Hepatic/Liver Function Pnl (80076-HEPATIC) TLB-TSH (Thyroid Stimulating Hormone) (84443-TSH) TLB-CK Total Only(Creatine Kinase/CPK) (82550-CK)  BP today: 128/68 Prior BP: 122/70 (04/04/2010)  Prior 10 Yr Risk Heart Disease: Not enough information (02/20/2010)  Labs Reviewed: K+: 4.2 (02/20/2010) Creat: : 0.7 (02/20/2010)   Chol: 224 (02/20/2010)   HDL: 58.10 (02/20/2010)   TG: 148.0 (02/20/2010)  Complete Medication List: 1)  Bayer Childrens Aspirin 81 Mg Chew (Aspirin) .... Once daily 2)  Icaps Areds Formula Tabs (Multiple vitamins-minerals) .... Take one tablet by mouth once daily. 3)  Multivitamins Tabs (Multiple vitamin) .... Once daily 4)  Bystolic 10 Mg Tabs (Nebivolol hcl) .... One by mouth once daily for high blood pressure 5)  Dyazide 37.5-25 Mg Caps (Triamterene-hctz) .... One by mouth once daily for high blood pressure  Hypertension Assessment/Plan:      The patient's hypertensive risk group is category C: Target organ damage and/or diabetes.  Today's blood pressure is 128/68.  Her blood pressure goal is < 140/90.  Lipid Assessment/Plan:      Based on NCEP/ATP III, the patient's risk factor category is "history of coronary disease, peripheral vascular disease, cerebrovascular disease, or aortic aneurysm".  The patient's lipid goals are as follows: Total cholesterol goal is 200; LDL cholesterol goal is 100; HDL cholesterol goal is 40; Triglyceride goal is 150.    Patient Instructions: 1)  Please schedule a follow-up appointment in 4 months. 2)  It is important that you exercise regularly at least 20 minutes 5 times a week. If you  develop chest pain, have severe difficulty breathing, or feel very tired , stop exercising immediately and seek medical attention. 3)  You need to lose weight. Consider a lower calorie diet and regular exercise.  4)  Check your Blood Pressure regularly. If it is above 130/80: you should make an  appointment. Prescriptions: BYSTOLIC 10 MG TABS (NEBIVOLOL HCL) One by mouth once daily for high blood pressure  #105 x 0   Entered and Authorized by:   Etta Grandchild MD   Signed by:   Etta Grandchild MD on 05/30/2010   Method used:   Samples Given   RxID:   [id number] DYAZIDE 37.5-25 MG CAPS (TRIAMTERENE-HCTZ) One by mouth once daily for high blood pressure  #90 x 3   Entered and Authorized by:   Etta Grandchild MD   Signed by:   Etta Grandchild MD on 05/30/2010   Method used:   Print then Give to Patient   RxID:   [id number]    Orders Added: 1)  Venipuncture [95284] 2)  TLB-Lipid Panel [80061-LIPID] 3)  TLB-BMP (Basic Metabolic Panel-BMET) [80048-METABOL] 4)  TLB-CBC Platelet - w/Differential [85025-CBCD] 5)  TLB-Hepatic/Liver Function Pnl [80076-HEPATIC] 6)  TLB-TSH (Thyroid Stimulating Hormone) [84443-TSH] 7)  TLB-CK Total Only(Creatine Kinase/CPK) [82550-CK] 8)  Est. Patient Level IV [13244]

## 2010-07-22 NOTE — Procedures (Addendum)
Summary: Colonoscopy  Patient: [name] Note: All result statuses are Final unless otherwise noted.  Tests: (1) Colonoscopy (COL)   COL Colonoscopy           DONE (C)     [facility]     [address]     [place], [place]  [address]           COLONOSCOPY PROCEDURE REPORT           PATIENT:  [name], [name]  MR#:  [id number]     BIRTHDATE:  [birth year], 74 yrs. old  GENDER:  female     ENDOSCOPIST:  Rachael Fee, MD     REF. BY:  Renford Dills, M.D.     PROCEDURE DATE:  05/20/2010     PROCEDURE:  Diagnostic Colonoscopy     ASA CLASS:  Class II     INDICATIONS:  "medium sized rectal mass" noted on colonoscopy by     Dr. Evette Cristal 06/2007; she was referred to surgeon but it doesn't seem     she ever went     MEDICATIONS:   Fentanyl 75 mcg IV, Versed 9 mg IV           DESCRIPTION OF PROCEDURE:   After the risks benefits and     alternatives of the procedure were thoroughly explained, informed     consent was obtained.  1-2cm soft nodule in proximal anus The LB     [id number]-[id number] endoscope was introduced through the anus and     advanced to the cecum, which was identified by both the appendix     and ileocecal valve, without limitations.  The quality of the prep     was good, using MoviPrep.  The instrument was then slowly     withdrawn as the colon was fully examined.     <<PROCEDUREIMAGES>>     FINDINGS:  A nodule was found. There was a 1.5 proximal rectum     nodule (was soft, but not clearly a hemorrhoid or skin tag) (see     image1 and image5).  This was otherwise a normal examination of     the colon (see image2 and image3).   Retroflexed views in the     rectum revealed no abnormalities.    The scope was then withdrawn     from the patient and the procedure completed.     COMPLICATIONS:  None     ENDOSCOPIC IMPRESSION:     1) Proximal anal nodule, 1.5cm     2) Otherwise normal examination; no polyps or cancers           RECOMMENDATIONS:     The nodule  noted by Dr. Evette Cristal in 2009 is still present.  Dr.     Christella Hartigan' office will arrange for general surgery referral for     resection (ddx papilloma, anal neoplasm, anal papilla, unusual     hemorrhoid or skin tag).           ______________________________     Rachael Fee, MD           n.     REVISED:  05/29/2010 09:59 AM     eSIGNED:   Rachael Fee at 05/29/2010 09:59 AM           Tomasita Crumble, [id number]  Note: An exclamation mark (!) indicates a result that was not dispersed into the flowsheet. Document Creation Date: 05/29/2010 10:03 AM _______________________________________________________________________  (1) Order  result status: Final Collection or observation date-time: 05/20/2010 11:46 Requested date-time:  Receipt date-time:  Reported date-time:  Referring Physician:   Ordering Physician: Rob Bunting [phone]) Specimen Source:  Source: Launa Grill Order Number: [id number] Lab site:

## 2010-07-22 NOTE — Assessment & Plan Note (Signed)
Summary: 4:00/nep/atrial flutter   Referring Provider:  Ledora Bottcher Primary Provider:  Hyacinth Meeker  CC:  atrial flutter.  History of Present Illness: Ms. [name] is referred today by Dr. Tenny Craw for evaluation of SVT/AFlutter.  She had experienced tachypalpitations for several months and had a cardiac monitor placed which demonstrated episodes of what I think are actually SVT at 150-160  per minute.  The spells would start and stop suddenly and were present despite atenolol.   The patient does not have chest pain or syncope but does experience dizziness.  She denies peripheral edema.  Current Medications (verified): 1)  Atenolol 25mg   Tabs (Atenolol) .... Bid 2)  Medi-Meclizine 25 Mg  Tabs (Meclizine Hcl) .... Once Daily 3)  Bayer Childrens Aspirin 81 Mg  Chew (Aspirin) .... Once Daily 4)  Simvastatin 20mg  .... Daily 5)  Triamterene-Hctz 37.5-25 Mg Caps (Triamterene-Hctz) .Marland Kitchen.. 1 By Mouth Once Daily 6)  Eye Vit .... Daily  Allergies (verified): 1)  ! Motrin  Past History:  Past Medical History:    Current Problems:     FIBROCYSTIC BREAST DISEASE (ICD-610.1)    Palpitations    HYPERTENSION (ICD-401.9)    MACULAR DEGENERATION (ICD-362.50)    THYROID NODULE (ICD-241.0)     (09/30/2008)  Past Surgical History:    Knee replacement 9 years ago.      (09/12/2008)  Family History:    neg for CAD    Family History of Coronary Artery Disease:      (09/12/2008)  Social History:     The patient does not smoke, does not drink.  Is a      retired Agricultural engineer.      (09/12/2008)  Review of Systems       All negative except as noted in the HPI.  Vital Signs:  Patient profile:   75 year old female Height:      68 inches Weight:      167 pounds BMI:     25.48 Pulse rate:   64 / minute Pulse rhythm:   regular BP sitting:   144 / 88  Vitals Entered By: Flonnie Overman (Nov 08, 2008 4:37 PM)  Physical Exam  General:  Well developed, well nourished, in no acute distress. Head:   normocephalic and atraumatic Eyes:  PERRLA/EOM intact; conjunctiva and lids normal. Mouth:  Teeth, gums and palate normal. Oral mucosa normal. Neck:  Neck supple, no JVD. No masses, thyromegaly or abnormal cervical nodes. Lungs:  Clear bilaterally to auscultation and percussion. Heart:  RRR with out murmur, rub or gallop.  Normal PMI. Abdomen:  Bowel sounds positive; abdomen soft and non-tender without masses, organomegaly, or hernias noted. No hepatosplenomegaly. Msk:  Back normal, normal gait. Muscle strength and tone normal. Pulses:  pulses normal in all 4 extremities Extremities:  No clubbing or cyanosis. Neurologic:  Alert and oriented x 3.   Impression & Recommendations:  Problem # 1:  PALPITATIONS (ICD-785.1) The patiet's symptoms are secondary to SVT/atrial flutter.  As she is already on atenolol, my recommendation would be to proceed with EPS/RFA of SVT.  The risks/benefits/goals and expectations of the procedure have been discussed and she wishes to proceed. Her updated medication list for this problem includes:    Bayer Childrens Aspirin 81 Mg Chew (Aspirin) ..... Once daily  Problem # 2:  HYPERTENSION (ICD-401.9) Continue current meds. Her updated medication list for this problem includes:    Bayer Childrens Aspirin 81 Mg Chew (Aspirin) ..... Once daily    Triamterene-hctz 37.5-25  Mg Caps (Triamterene-hctz) .Marland Kitchen... 1 by mouth once daily

## 2010-07-22 NOTE — Progress Notes (Signed)
Summary: hctz ?  Phone Note Call from Patient   Caller: Daughter Summary of Call: Sheryle Hail called b/c Pt went to see Dr Roxan Hockey, cardiologist in [place], and her BP was very high. Pt stated that Dr Yetta Barre removed her from the Dyazide and placed on the hydrochlorothiazide.    called and spoke to [facility]. explained that Dr Yetta Barre did not remove pt from Dyazide. That was done by Dr Christella Hartigan. Wanted to know if Dr Yetta Barre thinks it would be ok. advised her to speak with Dr Christella Hartigan about the change.  Initial call taken by: Alysia Penna,  April 22, 2010 4:14 PM

## 2010-07-22 NOTE — Letter (Signed)
Summary: EP Orders and Instructions  EP Orders and Instructions   Imported By: Kassie Mends 06/19/2009 12:53:15  _____________________________________________________________________  External Attachment:    Type:   Image     Comment:   External Document

## 2010-07-22 NOTE — Miscellaneous (Signed)
Summary: [facility]  Clinical Lists Changes  Medications: Added new medication of MOVIPREP 100 GM  SOLR (PEG-KCL-NACL-NASULF-NA ASC-C) As per prep instructions. - Signed Rx of MOVIPREP 100 GM  SOLR (PEG-KCL-NACL-NASULF-NA ASC-C) As per prep instructions.;  #1 x 0;  Signed;  Entered by: Ezra Sites RN;  Authorized by: Rachael Fee MD;  Method used: Electronically to [facility]  [address] [id number] [address], [place], [place]  [address], Ph: [phone] or [phone], Fax: [phone] Observations: Added new observation of ALLERGY REV: Done (05/05/2010 14:41)    Prescriptions: MOVIPREP 100 GM  SOLR (PEG-KCL-NACL-NASULF-NA ASC-C) As per prep instructions.  #1 x 0   Entered by:   Ezra Sites RN   Authorized by:   Rachael Fee MD   Signed by:   Ezra Sites RN on 05/05/2010   Method used:   Electronically to        [facility]  [address] [id number] (retail)       [address]       [place], [place]  [address]       Ph: [phone] or [phone]       Fax: [phone]   RxID:   [id number]   Appended Document: [facility] Dr Christella Hartigan the pt was seen today in previsit and is scheduled for Colon 05/20/10.  She needs EGD as well but time is not available for both.  She prefers to have the colon now and then schedule EGD after the first of the year.  Is this ok?  Appended Document: [facility] ok

## 2010-07-22 NOTE — Assessment & Plan Note (Signed)
Summary: rov per pt call/lg   Referring Guilianna Mckoy:  Ledora Bottcher Primary Nakea Gouger:  Hyacinth Meeker  CC:  rov and pt feeling well.  Marland Kitchen  History of Present Illness: [name] is a 75 year old with a history of palpitations and documented SVT.  I saw her earlier this year and she has also been seen by Lewayne Bunting.  Plan was for RF ablation since seen, she has not had the procedure done.  She is worried. She still has intermittent episodes of palpitations.  He says the spells can last a couple minutes.  The daugher says the spells last longer.  She denies dizziness.  Problems Prior to Update: 1)  Palpitations  (ICD-785.1) 2)  Hypertension  (ICD-401.9) 3)  Macular Degeneration  (ICD-362.50) 4)  Thyroid Nodule  (ICD-241.0) 5)  Fibrocystic Breast Disease  (ICD-610.1)  Medications Prior to Update: 1)  Atenolol 25mg   Tabs (Atenolol) .... Bid 2)  Medi-Meclizine 25 Mg  Tabs (Meclizine Hcl) .... Once Daily 3)  Bayer Childrens Aspirin 81 Mg  Chew (Aspirin) .... Once Daily 4)  Simvastatin 20mg  .... Daily 5)  Triamterene-Hctz 37.5-25 Mg Caps (Triamterene-Hctz) .Marland Kitchen.. 1 By Mouth Once Daily 6)  Eye Vit .... Daily  Current Medications (verified): 1)  Medi-Meclizine 25 Mg  Tabs (Meclizine Hcl) .... Once Daily 2)  Bayer Childrens Aspirin 81 Mg  Chew (Aspirin) .... Once Daily 3)  Simvastatin 20 Mg Tabs (Simvastatin) .... At Bedtime 4)  Triamterene-Hctz 37.5-25 Mg Caps (Triamterene-Hctz) .Marland Kitchen.. 1 By Mouth Once Daily 5)  Eye Vit .... Daily 6)  Atenolol 25 Mg Tabs (Atenolol) .... Two Times A Day  Allergies (verified): 1)  ! Motrin  Past History:  Past Surgical History: Last updated: 09/12/2008 Knee replacement 9 years ago.   Family History: Last updated: 09/12/2008 neg for CAD Family History of Coronary Artery Disease:   Social History: Last updated: 09/12/2008  The patient does not smoke, does not drink.  Is a   retired Agricultural engineer.   Past Medical History: Current Problems:   SVT FIBROCYSTIC BREAST DISEASE (ICD-610.1) Palpitations HYPERTENSION (ICD-401.9) MACULAR DEGENERATION (ICD-362.50) THYROID NODULE (ICD-241.0)  Review of Systems       All systems reviewed.  Negative to the above problem except as noted.  Vital Signs:  Patient profile:   75 year old female Height:      68 inches Weight:      166 pounds BMI:     25.33 Pulse rate:   68 / minute Pulse rhythm:   regular BP sitting:   118 / 88  (left arm) Cuff size:   regular  Vitals Entered By: Judithe Modest CMA (February 14, 2009 4:50 PM)  Physical Exam  Additional Exam:  HEENT:  Normocephalic, atraumatic. EOMI, PERRLA.  Neck: JVP is normal. No thyromegaly. No bruits.  Lungs: clear to auscultation. No rales no wheezes.  Heart: Regular rate and rhythm. Normal S1, S2. No S3.   No significant murmurs. PMI not displaced.  Abdomen:  Supple, nontender. Normal bowel sounds. No masses. No hepatomegaly.  Extremities:   Good distal pulses throughout. No lower extremity edema.  Musculoskeletal :moving all extremities.  Neuro:   alert and oriented x3.    EKG  Procedure date:  02/14/2009  Findings:      NSR.  68 bpm  First degree AV block  Impression & Recommendations:  Problem # 1:  PALPITATIONS (ICD-785.1) I do not have her monitor but there is documented SVT/Atrial flutter.  I will discuss with G. Ladona Ridgel.  My concern is  if this is flutter the need, in future for coumadin. I informed the patient and her daughter the details of hte procedure.  The patient remains nervous.  Overal, we need to think of hte risks for doing and not doing the procedure. Will have Dr. Ladona Ridgel contact the patient or her daugher Mindi Junker:  [phone]) Her updated medication list for this problem includes:    Bayer Childrens Aspirin 81 Mg Chew (Aspirin) ..... Once daily    Atenolol 25 Mg Tabs (Atenolol) .Marland Kitchen..Marland Kitchen Two times a day  Orders: EKG w/ Interpretation (93000)  Problem # 2:  HYPERTENSION (ICD-401.9) Adequate  control. Her updated medication list for this problem includes:    Bayer Childrens Aspirin 81 Mg Chew (Aspirin) ..... Once daily    Triamterene-hctz 37.5-25 Mg Caps (Triamterene-hctz) .Marland Kitchen... 1 by mouth once daily    Atenolol 25 Mg Tabs (Atenolol) .Marland Kitchen..Marland Kitchen Two times a day

## 2010-07-22 NOTE — Assessment & Plan Note (Signed)
Summary: eph/kfw   Referring Provider:  Ledora Bottcher Primary Provider:  Jimmye Norman MD   History of Present Illness: [name] returns today for followup.  She is a pleasant 75 yo woman with a h/o tachypalpitations despite medical therapy, who underwent EP study several weeks ago.  At that time, she was found to have no inducible arrhythmias.  She still feels occaisional palpitations but no sustained arrhythmias.  She has hypertension and occaisional leg cramps.  Current Medications (verified): 1)  Medi-Meclizine 25 Mg  Tabs (Meclizine Hcl) .... As Needed 2)  Bayer Childrens Aspirin 81 Mg  Chew (Aspirin) .... Once Daily 3)  Triamterene-Hctz 37.5-25 Mg Caps (Triamterene-Hctz) .Marland Kitchen.. 1 By Mouth Once Daily 4)  Atenolol 25 Mg Tabs (Atenolol) .... Two Times A Day 5)  Icaps Areds Formula  Tabs (Multiple Vitamins-Minerals) .... Take One Tablet By Mouth Once Daily. 6)  Multivitamins   Tabs (Multiple Vitamin) .... Once Daily  Allergies (verified): 1)  ! Motrin  Past History:  Past Medical History: Last updated: 05/09/2009 Current Problems:  PALPITATIONS (ICD-785.1) HYPERTENSION (ICD-401.9) MACULAR DEGENERATION (ICD-362.50) THYROID NODULE (ICD-241.0) FIBROCYSTIC BREAST DISEASE (ICD-610.1)    Past Surgical History: Last updated: 09/12/2008 Knee replacement 9 years ago.   Review of Systems  The patient denies chest pain, syncope, dyspnea on exertion, and peripheral edema.    Vital Signs:  Patient profile:   75 year old female Height:      68 inches Weight:      169 pounds BMI:     25.79 Pulse rate:   75 / minute BP sitting:   114 / 82  (left arm) Cuff size:   regular  Vitals Entered By: Hardin Negus, RMA (July 15, 2009 2:15 PM)  Physical Exam  General:  Well developed, well nourished, in no acute distress. Head:  normocephalic and atraumatic Eyes:  PERRLA/EOM intact; conjunctiva and lids normal. Mouth:  Teeth, gums and palate normal. Oral mucosa normal. Neck:   Neck supple, no JVD. No masses, thyromegaly or abnormal cervical nodes. Lungs:  Clear bilaterally to auscultation with no wheezes, rales, or rhonchi. Heart:  RRR with out murmur, rub or gallop.  Normal PMI. Abdomen:  Bowel sounds positive; abdomen soft and non-tender without masses, organomegaly, or hernias noted. No hepatosplenomegaly. Msk:  Back normal, normal gait. Muscle strength and tone normal. Pulses:  pulses normal in all 4 extremities Extremities:  No clubbing or cyanosis. Neurologic:  Alert and oriented x 3.   Impression & Recommendations:  Problem # 1:  PALPITATIONS (ICD-785.1) The mechanism of her symptoms is unclear but she is only minimally symptomatic.  Will followup. Her updated medication list for this problem includes:    Bayer Childrens Aspirin 81 Mg Chew (Aspirin) ..... Once daily    Atenolol 25 Mg Tabs (Atenolol) .Marland Kitchen..Marland Kitchen Two times a day  Problem # 2:  HYPERTENSION (ICD-401.9) Her blood pressure is well controlled. Her updated medication list for this problem includes:    Bayer Childrens Aspirin 81 Mg Chew (Aspirin) ..... Once daily    Triamterene-hctz 37.5-25 Mg Caps (Triamterene-hctz) .Marland Kitchen... 1 by mouth once daily    Atenolol 25 Mg Tabs (Atenolol) .Marland Kitchen..Marland Kitchen Two times a day  Patient Instructions: 1)  Your physician recommends that you schedule a follow-up appointment in: 12 months

## 2010-07-22 NOTE — Letter (Signed)
Summary: Cardiology NP Eval/[facility]  Cardiology NP Eval/[facility]   Imported By: Sherian Rein 04/30/2010 11:02:37  _____________________________________________________________________  External Attachment:    Type:   Image     Comment:   External Document

## 2010-07-22 NOTE — Miscellaneous (Signed)
Summary: Orders Update  Clinical Lists Changes  Orders: Added new Test order of T-Basic Metabolic Panel ([id number]) - Signed  

## 2010-07-22 NOTE — Letter (Signed)
Summary: [facility] Instructions  [facility]  [address] [place], [place] [address]   Phone: [phone]  Fax: [phone]       [name]    10/06/1935    MRN: [id number]        Procedure Day /Date:  Tuesday 05/20/2110     Arrival Time:  10:00 am      Procedure Time:  11:00 am     Location of Procedure:                    _x _  [facility] (4th Floor)                        PREPARATION FOR COLONOSCOPY WITH MOVIPREP   Starting 5 days prior to your procedure Thursday 11/24 do not eat nuts, seeds, popcorn, corn, beans, peas,  salads, or any raw vegetables.  Do not take any fiber supplements (e.g. Metamucil, Citrucel, and Benefiber).  THE DAY BEFORE YOUR PROCEDURE         DATE: Monday 11/28  1.  Drink clear liquids the entire day-NO SOLID FOOD  2.  Do not drink anything colored red or purple.  Avoid juices with pulp.  No orange juice.  3.  Drink at least 64 oz. (8 glasses) of fluid/clear liquids during the day to prevent dehydration and help the prep work efficiently.  CLEAR LIQUIDS INCLUDE: Water Jello Ice Popsicles Tea (sugar ok, no milk/cream) Powdered fruit flavored drinks Coffee (sugar ok, no milk/cream) Gatorade Juice: apple, white grape, white cranberry  Lemonade Clear bullion, consomm, broth Carbonated beverages (any kind) Strained chicken noodle soup Hard Candy                             4.  In the morning, mix first dose of MoviPrep solution:    Empty 1 Pouch A and 1 Pouch B into the disposable container    Add lukewarm drinking water to the top line of the container. Mix to dissolve    Refrigerate (mixed solution should be used within 24 hrs)  5.  Begin drinking the prep at 5:00 p.m. The MoviPrep container is divided by 4 marks.   Every 15 minutes drink the solution down to the next mark (approximately 8 oz) until the full liter is complete.   6.  Follow completed prep with 16 oz of clear liquid of your choice  (Nothing red or purple).  Continue to drink clear liquids until bedtime.  7.  Before going to bed, mix second dose of MoviPrep solution:    Empty 1 Pouch A and 1 Pouch B into the disposable container    Add lukewarm drinking water to the top line of the container. Mix to dissolve    Refrigerate  THE DAY OF YOUR PROCEDURE      DATE: Tuesday 11/29  Beginning at 6:00 a.m. (5 hours before procedure):         1. Every 15 minutes, drink the solution down to the next mark (approx 8 oz) until the full liter is complete.  2. Follow completed prep with 16 oz. of clear liquid of your choice.    3. You may drink clear liquids until 9:00 am (2 HOURS BEFORE PROCEDURE).   MEDICATION INSTRUCTIONS  Unless otherwise instructed, you should take regular prescription medications with a small sip of water   as early as possible the  morning of your procedure.   Additional medication instructions: Do not take HCTZ day of procedure.         OTHER INSTRUCTIONS  You will need a responsible adult at least 73 years of age to accompany you and drive you home.   This person must remain in the waiting room during your procedure.  Wear loose fitting clothing that is easily removed.  Leave jewelry and other valuables at home.  However, you may wish to bring a book to read or  an iPod/MP3 player to listen to music as you wait for your procedure to start.  Remove all body piercing jewelry and leave at home.  Total time from sign-in until discharge is approximately 2-3 hours.  You should go home directly after your procedure and rest.  You can resume normal activities the  day after your procedure.  The day of your procedure you should not:   Drive   Make legal decisions   Operate machinery   Drink alcohol   Return to work  You will receive specific instructions about eating, activities and medications before you leave.    The above instructions have been reviewed and explained to me by    Ezra Sites RN  May 05, 2010 3:34 PM     I fully understand and can verbalize these instructions _____________________________ Date _________

## 2010-07-22 NOTE — Letter (Signed)
Summary: Pre Visit Letter Revised  [facility]  [address] [place], [place] [address]   Phone: [phone]  Fax: [phone]        04/07/2010 MRN: [id number] [name] [address] [place], [place]  [address]             Procedure Date:  05/20/2010  Welcome to the Gastroenterology Division at [facility].    You are scheduled to see a nurse for your pre-procedure visit on 05/05/2010 at 3:30PM on the 3rd floor at [facility], [address]  We ask that you try to arrive at our office 15 minutes prior to your appointment time to allow for check-in.  Please take a minute to review the attached form.  If you answer "Yes" to one or more of the questions on the first page, we ask that you call the person listed at your earliest opportunity.  If you answer "No" to all of the questions, please complete the rest of the form and bring it to your appointment.    Your nurse visit will consist of discussing your medical and surgical history, your immediate family medical history, and your medications.   If you are unable to list all of your medications on the form, please bring the medication bottles to your appointment and we will list them.  We will need to be aware of both prescribed and over the counter drugs.  We will need to know exact dosage information as well.    Please be prepared to read and sign documents such as consent forms, a financial agreement, and acknowledgement forms.  If necessary, and with your consent, a friend or relative is welcome to sit-in on the nurse visit with you.  Please bring your insurance card so that we may make a copy of it.  If your insurance requires a referral to see a specialist, please bring your referral form from your primary care physician.  No co-pay is required for this nurse visit.     If you cannot keep your appointment, please call [phone] to cancel or reschedule prior to your appointment date.   This allows [place] the opportunity to schedule an appointment for another patient in need of care.    Thank you for choosing [facility] for your medical needs.  We appreciate the opportunity to care for you.  Please visit [place] at our website  to learn more about our practice.  Sincerely, The Gastroenterology Division

## 2010-07-22 NOTE — Assessment & Plan Note (Signed)
Summary: FU--STC   Vital Signs:  Patient profile:   75 year old female Menstrual status:  postmenopausal Height:      68 inches Weight:      166 pounds O2 Sat:      94 % on Room air Temp:     97.4 degrees F oral Pulse rate:   60 / minute Pulse rhythm:   regular Resp:     16 per minute BP sitting:   122 / 70  (right arm)  O2 Flow:  Room air  Primary Care Yaron Grasse:  Etta Grandchild MD   History of Present Illness:  Hypertension Follow-Up      This is a 75 year old woman who presents for Hypertension follow-up.  The patient denies lightheadedness, urinary frequency, headaches, edema, rash, and fatigue.  The patient denies the following associated symptoms: chest pain, chest pressure, exercise intolerance, dyspnea, palpitations, syncope, leg edema, and pedal edema.  Compliance with medications (by patient report) has been near 100%.  The patient reports that dietary compliance has been fair.  The patient reports exercising occasionally.  Adjunctive measures currently used by the patient include salt restriction and relaxation.   Her cardiolite test was normal.  She has seen ? Ortho MD and says that she had a bone scan done but she does not know the results yet. She still has some pain and swelling in her right knee.   Hypertension History:      She denies headache, chest pain, palpitations, dyspnea with exertion, orthopnea, PND, peripheral edema, visual symptoms, neurologic problems, syncope, and side effects from treatment.  She notes no problems with any antihypertensive medication side effects.        Positive major cardiovascular risk factors include female age 66 years old or older, hyperlipidemia, and hypertension.  Negative major cardiovascular risk factors include no history of diabetes, negative family history for ischemic heart disease, and non-tobacco-user status.        Further assessment for target organ damage reveals no history of ASHD, cardiac end-organ damage (CHF/LVH),  stroke/TIA, peripheral vascular disease, renal insufficiency, or hypertensive retinopathy.    Lipid Management History:      Positive NCEP/ATP III risk factors include female age 53 years old or older and hypertension.  Negative NCEP/ATP III risk factors include non-diabetic, no family history for ischemic heart disease, non-tobacco-user status, no ASHD (atherosclerotic heart disease), no prior stroke/TIA, no peripheral vascular disease, and no history of aortic aneurysm.        The patient states that she knows about the "Therapeutic Lifestyle Change" diet.  Her compliance with the TLC diet is fair.  The patient expresses understanding of adjunctive measures for cholesterol lowering.  Adjunctive measures started by the patient include aerobic exercise, fiber, ASA, limit alcohol consumpton, and weight reduction.  She expresses no side effects from her lipid-lowering medication.  The patient denies any symptoms to suggest myopathy or liver disease.      Allergies: 1)  ! Motrin  Past History:  Past Medical History: Last updated: 02/20/2010 ALPITATIONS (ICD-785.1) HYPERTENSION (ICD-401.9) MACULAR DEGENERATION (ICD-362.50) THYROID NODULE (ICD-241.0) FIBROCYSTIC BREAST DISEASE (ICD-610.1)    Hyperlipidemia  Past Surgical History: Last updated: 01/31/2010 Knee  (Rt.) replacement 9 years ago.    Family History: Last updated: 01/24/2010 neg for CAD Family History of Coronary Artery Disease:  no colon cancer  Social History: Last updated: 01/31/2010  The patient does not smoke, does not drink.  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 neg for CAD Family History of Coronary Artery Disease:    no colon cancer  Social History: Reviewed history from 01/31/2010 and no changes required.  The patient does not smoke, does not drink.  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  The patient denies anorexia, fever, weight loss, chest pain, syncope, dyspnea on exertion, peripheral edema, prolonged cough, headaches, hemoptysis, abdominal pain, melena, hematochezia, severe indigestion/heartburn, suspicious skin lesions, difficulty walking, and depression.   ENT:  Complains of decreased hearing and ringing in ears; denies difficulty swallowing, ear discharge, earache, hoarseness, nasal congestion, nosebleeds, sinus pressure, and sore throat.  Physical Exam  General:  alert, well-developed, well-nourished, well-hydrated, appropriate dress, normal appearance, healthy-appearing, cooperative to examination, good hygiene, and overweight-appearing.   Head:  normocephalic, atraumatic, no abnormalities observed, and no abnormalities palpated.   Mouth:  Oral mucosa and oropharynx without lesions or exudates.  Teeth in good repair. Neck:  supple, full ROM, no masses, no thyromegaly, no thyroid nodules or tenderness, no JVD, normal carotid upstroke, no carotid bruits, no cervical lymphadenopathy, and no neck tenderness.   Lungs:  Normal respiratory effort, chest expands symmetrically. Lungs are clear to auscultation, no crackles or wheezes. Heart:  Normal rate and regular rhythm. S1 and S2 normal without gallop, murmur, click, rub or other extra sounds. Abdomen:  soft, non-tender, normal bowel sounds, no distention, no masses, no guarding, no rigidity, no hepatomegaly, and no splenomegaly.   Msk:  right knee is mildly swollen but there is no ttp and there is good ROM with no joint laxity. Pulses:  R and L carotid,radial,femoral,dorsalis pedis and  posterior tibial pulses are full and equal bilaterally Extremities:  trace left pedal edema and trace right pedal edema.   Neurologic:  No cranial nerve deficits noted. Station and gait are normal. Plantar reflexes are down-going bilaterally. DTRs are symmetrical throughout. Sensory, motor and coordinative functions appear intact. Skin:  Intact without suspicious lesions or rashes Cervical Nodes:  No lymphadenopathy noted Psych:  Cognition and judgment appear intact. Alert and cooperative with normal attention span and concentration. No apparent delusions, illusions, hallucinations   Impression & Recommendations:  Problem # 1:  UNSPECIFIED HEARING LOSS (ICD-389.9) Assessment New  Orders: Audiology (Audio)  Problem # 2:  HYPERTENSION (ICD-401.9) Assessment: Improved  Her updated medication list for this problem includes:    Bystolic 10 Mg Tabs (Nebivolol hcl) ..... One by mouth once daily for high blood pressure    Microzide 12.5 Mg Caps (Hydrochlorothiazide) ..... One by mouth once daily for high blood pressure  Problem # 3:  THYROID NODULE (ICD-241.0) Assessment: Unchanged scan was benign  Problem # 4:  ABNORMAL MAMMOGRAM (ICD-793.80) Assessment: Unchanged  Orders: Radiology Referral (Radiology)  Complete Medication List: 1)  Bayer Childrens Aspirin 81 Mg Chew (Aspirin) .... Once daily 2)  Icaps Areds Formula Tabs (Multiple vitamins-minerals) .... Take one tablet by mouth once daily. 3)  Multivitamins Tabs (Multiple vitamin) .... Once daily 4)  Hyomax-sr 0.375 Mg Xr12h-tab (Hyoscyamine sulfate) .... One by mouth two times a day as needed for abd bloating 5)  Bystolic 10 Mg Tabs (  Nebivolol hcl) .... One by mouth once daily for high blood pressure 6)  Microzide 12.5 Mg Caps (Hydrochlorothiazide) .... One by mouth once daily for high blood pressure 7)  Crestor 5 Mg Tabs (Rosuvastatin calcium) .... One by mouth once daily  Other Orders: Gastroenterology Referral  (GI)  Hypertension Assessment/Plan:      The patient's hypertensive risk group is category B: At least one risk factor (excluding diabetes) with no target organ damage.  Today's blood pressure is 122/70.  Her blood pressure goal is < 140/90.  Lipid Assessment/Plan:      Based on NCEP/ATP III, the patient's risk factor category is "2 or more risk factors and a calculated 10 year CAD risk of > 20%".  The patient's lipid goals are as follows: Total cholesterol goal is 200; LDL cholesterol goal is 130; HDL cholesterol goal is 40; Triglyceride goal is 150.    PAP Screening:    Hx Cervical Dysplasia in last 5 yrs? No    3 normal PAP smears in last 5 yrs? Yes    Reviewed PAP smear recommendations:  patient refuses understanding risks of delayed diagnosis  Mammogram Screening:    Last Mammogram:  08/20/2009    Reviewed Mammogram recommendations:  mammogram ordered  Osteoporosis Risk Assessment:  Risk Factors for Fracture or Low Bone Density:   Smoking status:       never  Immunization & Chemoprophylaxis:    Tetanus vaccine: Historical  (06/22/2009)    Pneumovax: given  (06/22/2002)  Patient Instructions: 1)  Please schedule a follow-up appointment in 4 months. 2)  It is important that you exercise regularly at least 20 minutes 5 times a week. If you develop chest pain, have severe difficulty breathing, or feel very tired , stop exercising immediately and seek medical attention. 3)  You need to lose weight. Consider a lower calorie diet and regular exercise.  4)  Schedule your mammogram. 5)  Schedule a colonoscopy/sigmoidoscopy to help detect colon cancer. 6)  You need to have a Pap Smear to prevent cervical cancer. 7)  Take an Aspirin every day. 8)  Check your Blood Pressure regularly. If it is above 140/90: you should make an appointment. Prescriptions: CRESTOR 5 MG TABS (ROSUVASTATIN CALCIUM) One by mouth once daily  #30 x 11   Entered and Authorized by:   Etta Grandchild MD   Signed  by:   Etta Grandchild MD on 04/04/2010   Method used:   Electronically to        [facility]  [address] [id number] (retail)       [address]       [place], [place]  [address]       Ph: [phone] or [phone]       Fax: [phone]   RxID:   [id number] MICROZIDE 12.5 MG CAPS (HYDROCHLOROTHIAZIDE) One by mouth once daily for high blood pressure  #30 x 11   Entered and Authorized by:   Etta Grandchild MD   Signed by:   Etta Grandchild MD on 04/04/2010   Method used:   Electronically to        [facility]  [address] [id number] (ret[facility])       [address]       [place], [place]  [address]       Ph: [phone] or [phone]       Fax: [phone]   RxID:  [id number]  

## 2010-07-22 NOTE — Progress Notes (Signed)
Summary: results/swelling  Phone Note Call from Patient Call back at Home Phone [phone] Call back at Work Phone [phone]   Caller: Patient Summary of Call: Patient called  triage requesting results of knee xray. She c/o continued R knee swelling and pain. Please advise thanks Initial call taken by: Rock Nephew CMA,  February 04, 2010 3:15 PM  Follow-up for Phone Call        she has an effusion in her knee so she was referred to ortho Follow-up by: Etta Grandchild MD,  February 04, 2010 6:57 PM  Additional Follow-up for Phone Call Additional follow up Details #1::        Patient notified.Alvy Beal Archie CMA  February 05, 2010 1:20 PM

## 2010-07-22 NOTE — Progress Notes (Signed)
Summary: DEVICES RETURNED  Phone Note Call from Patient Call back at Home Phone [phone]   Caller: Patient Reason for Call: Talk to Nurse Summary of Call: PT RECEIVED A LETTER THAT SHE NEEDS TO RETURN DEVICES BUT PT HAS ALREADY RETURNED THE DEVICES TO DR ROSS LAST 5-20 , PLS CALL DAUGHTER CELL [phone] TO VERIFY THIS.   Initial call taken by: Sydell Axon,  Nov 12, 2008 2:13 PM  Follow-up for Phone Call        Told daughter to disregard letter. Follow-up by: Julieta Gutting, RN, BSN,  Nov 12, 2008 3:41 PM

## 2010-07-22 NOTE — Letter (Signed)
Summary: Pre Visit No Show Letter  [facility]  [address] [place], [place] [address]   Phone: [phone]  Fax: [phone]        February 26, 2010 MRN: [id number]    [name] [address] [place], [place]  [address]    Dear Ms. Ferraiolo,   We have been unable to reach you by phone concerning the pre-procedure visit that you missed on 02/26/10. For this reason,your procedure scheduled on 03/07/10 has been cancelled. Our scheduling staff will gladly assist you with rescheduling your appointments at a more convenient time. Please call our office at [phone] between the hours of 8:00am and 5:00pm, press option #2 to reach an appointment scheduler. Please consider updating your contact numbers at this time so that we can reach you by phone in the future with schedule changes or results.    Thank you,    Wyona Almas RN [facility]

## 2010-07-22 NOTE — Assessment & Plan Note (Signed)
Summary: discuss referral/#/cd   Vital Signs:  Patient profile:   75 year old female Height:      68 inches Weight:      166 pounds BMI:     25.33 O2 Sat:      95 % on Room air Temp:     98 degrees F oral Pulse rate:   65 / minute Pulse rhythm:   regular Resp:     16 per minute BP sitting:   104 / 70  (left arm) Cuff size:   regular  Vitals Entered By: Alysia Penna (March 17, 2010 2:28 PM)  Nutrition Counseling: Patient's BMI is greater than 25 and therefore counseled on weight management options.  O2 Flow:  Room air CC: pt here for referral for thyroid sonogram. /cp sma, Hypertension Management Comments pt no longer taking hyomax or microzide.   Primary Care Jordany Russett:  Etta Grandchild MD  CC:  pt here for referral for thyroid sonogram. /cp sma and Hypertension Management.  History of Present Illness: She returns today and she requests a referral for a f/up thyroid u/s to track a thyroid nodule and to have a 6 month f/up mammogram done in light of a prior abnormal mammogram.  Hypertension History:      She denies headache, chest pain, palpitations, dyspnea with exertion, orthopnea, PND, peripheral edema, visual symptoms, neurologic problems, syncope, and side effects from treatment.  She notes no problems with any antihypertensive medication side effects.        Positive major cardiovascular risk factors include female age 64 years old or older, hyperlipidemia, and hypertension.  Negative major cardiovascular risk factors include no history of diabetes, negative family history for ischemic heart disease, and non-tobacco-user status.        Further assessment for target organ damage reveals no history of ASHD, cardiac end-organ damage (CHF/LVH), stroke/TIA, peripheral vascular disease, renal insufficiency, or hypertensive retinopathy.     Current Medications (verified): 1)  Bayer Childrens Aspirin 81 Mg  Chew (Aspirin) .... Once Daily 2)  Icaps Areds Formula  Tabs  (Multiple Vitamins-Minerals) .... Take One Tablet By Mouth Once Daily. 3)  Multivitamins   Tabs (Multiple Vitamin) .... Once Daily 4)  Hyomax-Sr 0.375 Mg Xr12h-Tab (Hyoscyamine Sulfate) .... One By Mouth Two Times A Day As Needed For Abd Bloating 5)  Bystolic 10 Mg Tabs (Nebivolol Hcl) .... One By Mouth Once Daily For High Blood Pressure 6)  Vytorin 10-40 Mg Tabs (Ezetimibe-Simvastatin) .... One By Mouth Once Daily For Cholesterol 7)  Microzide 12.5 Mg Caps (Hydrochlorothiazide) .... One By Mouth Once Daily For High Blood Pressure  Allergies (verified): 1)  ! Motrin  Past History:  Past Medical History: Last updated: 02/20/2010 ALPITATIONS (ICD-785.1) HYPERTENSION (ICD-401.9) MACULAR DEGENERATION (ICD-362.50) THYROID NODULE (ICD-241.0) FIBROCYSTIC BREAST DISEASE (ICD-610.1)    Hyperlipidemia  Past Surgical History: Last updated: 01/31/2010 Knee  (Rt.) replacement 9 years ago.    Family History: Last updated: 01/24/2010 neg for CAD Family History of Coronary Artery Disease:  no colon cancer  Social History: Last updated: 01/31/2010  The patient does not smoke, does not drink.  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 neg for CAD Family History of Coronary Artery Disease:  no colon cancer  Social History: Reviewed  history from 01/31/2010 and no changes required.  The patient does not smoke, does not drink.  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   ENT:  Denies decreased hearing, difficulty swallowing, earache, hoarseness, and sore throat. Endo:  Denies cold intolerance, excessive hunger, excessive thirst, excessive urination, heat intolerance, polyuria, and weight change.  Physical Exam  General:  alert, well-developed, well-nourished, well-hydrated, appropriate dress, normal appearance, healthy-appearing, cooperative to examination, good hygiene, and overweight-appearing.   Mouth:  Oral mucosa and oropharynx without lesions or exudates.  Teeth in good repair. Neck:  supple, full ROM, no masses, no thyromegaly, no thyroid nodules or tenderness, no JVD, normal carotid upstroke, no carotid bruits, no cervical lymphadenopathy, and no neck tenderness.   Lungs:  Normal respiratory effort, chest expands symmetrically. Lungs are clear to auscultation, no crackles or wheezes. Heart:  Normal rate and regular rhythm. S1 and S2 normal without gallop, murmur, click, rub or other extra sounds. Abdomen:  soft, non-tender, normal bowel sounds, no distention, no masses, no guarding, no rigidity, no hepatomegaly, and no splenomegaly.   Msk:  right knee is mildly swollen but there is no ttp and there is good ROM with no joint laxity. Pulses:  R and L carotid,radial,femoral,dorsalis pedis and posterior tibial pulses are full and equal bilaterally Extremities:  trace left pedal edema and trace right pedal edema.   Neurologic:  No cranial nerve deficits noted. Station and gait are normal. Plantar reflexes are down-going bilaterally. DTRs are symmetrical throughout. Sensory, motor and coordinative functions appear intact. Skin:  Intact without suspicious lesions or rashes Cervical Nodes:  No lymphadenopathy noted Psych:  Cognition and judgment appear intact. Alert and cooperative with normal attention span and concentration. No  apparent delusions, illusions, hallucinations   Impression & Recommendations:  Problem # 1:  ABNORMAL MAMMOGRAM (ICD-793.80) Assessment New  Orders: Radiology Referral (Radiology)  Problem # 2:  HYPERTENSION (ICD-401.9) Assessment: Improved  Her updated medication list for this problem includes:    Bystolic 10 Mg Tabs (Nebivolol hcl) ..... One by mouth once daily for high blood pressure    Microzide 12.5 Mg Caps (Hydrochlorothiazide) ..... One by mouth once daily for high blood pressure  BP today: 104/70 Prior BP: 134/92 (03/06/2010)  Prior 10 Yr Risk Heart Disease: Not enough information (02/20/2010)  Labs Reviewed: K+: 4.2 (02/20/2010) Creat: : 0.7 (02/20/2010)   Chol: 224 (02/20/2010)   HDL: 58.10 (02/20/2010)   TG: 148.0 (02/20/2010)  Problem # 3:  THYROID NODULE (ICD-241.0) Assessment: Unchanged  Orders: Radiology Referral (Radiology)  Complete Medication List: 1)  Bayer Childrens Aspirin 81 Mg Chew (Aspirin) .... Once daily 2)  Icaps Areds Formula Tabs (Multiple vitamins-minerals) .... Take one tablet by mouth once daily. 3)  Multivitamins Tabs (Multiple vitamin) .... Once daily 4)  Hyomax-sr 0.375 Mg Xr12h-tab (Hyoscyamine sulfate) .... One by mouth two times a day as needed for abd bloating 5)  Bystolic 10 Mg Tabs (Nebivolol hcl) .... One by mouth once daily for high blood pressure 6)  Vytorin 10-40 Mg Tabs (Ezetimibe-simvastatin) .... One by mouth once daily for cholesterol 7)  Microzide 12.5 Mg Caps (Hydrochlorothiazide) .... One by mouth once daily for high blood pressure  Hypertension Assessment/Plan:  The patient's hypertensive risk group is category B: At least one risk factor (excluding diabetes) with no target organ damage.  Today's blood pressure is 104/70.  Her blood pressure goal is < 140/90.  Patient Instructions: 1)  Please schedule a follow-up appointment in 3 months. 2)  It is important that you exercise regularly at least 20 minutes 5 times a  week. If you develop chest pain, have severe difficulty breathing, or feel very tired , stop exercising immediately and seek medical attention. 3)  You need to lose weight. Consider a lower calorie diet and regular exercise.  4)  Check your Blood Pressure regularly. If it is above 130/80: you should make an appointment.

## 2010-07-22 NOTE — Progress Notes (Signed)
Summary: CHEST PAINS (discuss w/ GT)  Phone Note Call from Patient Call back at Home Phone [phone]   Caller: Patient Summary of Call: PT HAVING CHEST PAINS NOW  Initial call taken by: Judie Grieve,  June 10, 2009 8:51 AM  Follow-up for Phone Call        I spoke with the pt. She states she had an ablation on 06/03/09. Since her procedure, she has had problems swallowing and c/o cp "where the tube went down." I cannot find a TEE report, but I asked the pt if this is what she had done, and she states that is correct. It looks as though nothing was ablated in her op note. After further discussion with the pt, she states that her pain in not in her throat, but just below her neck area and towards her right breast. She states it  hurts to belch as well. She took some tylenol last night and it did help. She is not having any pressure around her heart. I have discussed this with Kelly-RN for Dr. Ladona Ridgel. She will discuss this further with Dr. Ladona Ridgel and we will call the pt back. Sherri Rad, RN, BSN  June 10, 2009 9:18 AM   Additional Follow-up for Phone Call Additional follow up Details #1::        spoke with Dr Ladona Ridgel.  Talked with pt it sounds like where the catheter was palced for the ablation.  She will continue to try Tylenol and Ibuprofen over the next couple of days.  If it doesnt get any better will call me back. Dennis Bast, RN, BSN  June 10, 2009 2:30 PM

## 2010-07-23 HISTORY — PX: TRANSANAL EXCISION OF RECTAL MASS: SHX6134

## 2010-07-24 NOTE — Assessment & Plan Note (Signed)
Summary: HEADACHES--FELL LAST WK   STC   Vital Signs:  Patient profile:   75 year old female Menstrual status:  postmenopausal Height:      68 inches Weight:      166 pounds BMI:     25.33 O2 Sat:      97 % on Room air Temp:     98.7 degrees F oral Pulse rate:   57 / minute Pulse rhythm:   regular Resp:     16 per minute BP sitting:   120 / 82  (left arm) Cuff size:   large  Vitals Entered By: Rock Nephew CMA (June 26, 2010 3:01 PM)  Nutrition Counseling: Patient's BMI is greater than 25 and therefore counseled on weight management options.  O2 Flow:  Room air CC: Patient c/o headache since fall on 06/16/10// seen at [facility], Headaches, Hypertension Management Is Patient Diabetic? No  Does patient need assistance? Functional Status Self care Ambulation Normal   Primary Care Provider:  Etta Grandchild MD  CC:  Patient c/o headache since fall on 06/16/10// seen at [facility], Headaches, and Hypertension Management.  History of Present Illness:  Headaches      This is a 75 year old woman who presents with Headaches.  The symptoms began 4-8 weeks ago.  On a scale of 1 to 10, the intensity is described as a 3.  The patient denies nausea, vomiting, sweats, tearing of eyes, nasal congestion, sinus pain, sinus pressure, photophobia, and phonophobia.  The headache is described as intermittent and throbbing.  The location of the pain is first on right and then bilateral.  High-risk features (red flags) include trauma- she tripped over a curb 2 weeks ago and hit the front of her head, new type of headache, and age >50 years.  The patient denies the following high-risk features: fever, neck pain/stiffness, vision loss or change, focal weakness, altered mental status, rash, pain worse with exertion, concomitant infection, and anticoagulation use.  The headaches are precipitated by stress.  Prior treatment has included acetaminophen.  She has dizziness and vertigo -  both are chronic and she  wants a refill on Meclizine today.  Hypertension History:      She complains of headache and neurologic problems, but denies chest pain, palpitations, dyspnea with exertion, orthopnea, PND, peripheral edema, visual symptoms, syncope, and side effects from treatment.  She notes no problems with any antihypertensive medication side effects.        Positive major cardiovascular risk factors include female age 34 years old or older, hyperlipidemia, and hypertension.  Negative major cardiovascular risk factors include no history of diabetes, negative family history for ischemic heart disease, and non-tobacco-user status.        Positive history for target organ damage include peripheral vascular disease.  Further assessment for target organ damage reveals no history of ASHD, cardiac end-organ damage (CHF/LVH), stroke/TIA, renal insufficiency, or hypertensive retinopathy.     Current Medications (verified): 1)  Bayer Childrens Aspirin 81 Mg  Chew (Aspirin) .... Once Daily 2)  Icaps Areds Formula  Tabs (Multiple Vitamins-Minerals) .... Take One Tablet By Mouth Once Daily. 3)  Multivitamins   Tabs (Multiple Vitamin) .... Once Daily 4)  Bystolic 10 Mg Tabs (Nebivolol Hcl) .... One By Mouth Once Daily For High Blood Pressure 5)  Dyazide 37.5-25 Mg Caps (Triamterene-Hctz) .... One By Mouth Once Daily For High Blood Pressure  Allergies (verified): 1)  ! Motrin 2)  ! Crestor (Rosuvastatin Calcium)  Past History:  Past Surgical History: Last updated: 01/31/2010 Knee  (Rt.) replacement 9 years ago.    Family History: Last updated: 01/24/2010 neg for CAD Family History of Coronary Artery Disease:  no colon cancer  Social History: Last updated: 01/31/2010  The patient does not smoke, does not drink.  Is a   retired Agricultural engineer. does not drink alcohol, Retired Single Never Smoked Alcohol use-no Drug use-no Regular exercise-no  Risk Factors: Alcohol Use: 0 (03/06/2010) Exercise: no  (01/31/2010)  Risk Factors: Smoking Status: never (03/06/2010)  Past Medical History: ALPITATIONS (ICD-785.1) HYPERTENSION (ICD-401.9) MACULAR DEGENERATION (ICD-362.50) THYROID NODULE (ICD-241.0) FIBROCYSTIC BREAST DISEASE (ICD-610.1)    Hyperlipidemia Anxiety  Family History: Reviewed history from 01/24/2010 and no changes required. neg for CAD Family History of Coronary Artery Disease:  no colon cancer  Social History: Reviewed history from 01/31/2010 and no changes required.  The patient does not smoke, does not drink.  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   Psych:  Complains of anxiety; denies alternate hallucination ( auditory/visual), depression, easily angered, easily tearful, irritability, mental problems, panic attacks, sense of great danger, suicidal thoughts/plans, thoughts of violence, unusual visions or sounds, and thoughts /plans of harming others.  Physical Exam  General:  alert, well-developed, well-nourished, well-hydrated, appropriate dress, normal appearance, healthy-appearing, cooperative to examination, good hygiene, and overweight-appearing.   Head:  normocephalic, atraumatic, no abnormalities observed, no abnormalities palpated, and no alopecia.   Eyes:  vision grossly intact, pupils equal, pupils round, pupils reactive to light, pupils react to accomodation, no retinal abnormalitiies, and no nystagmus.   Ears:  R ear normal and L ear normal.   Nose:  External nasal examination shows no deformity or inflammation. Nasal mucosa are pink  and moist without lesions or exudates. Mouth:  Oral mucosa and oropharynx without lesions or exudates.  Teeth in good repair. Neck:  supple, full ROM, no masses, no thyromegaly, no thyroid nodules or tenderness, no JVD, normal carotid upstroke, no carotid bruits, no cervical lymphadenopathy, and no neck tenderness.   Lungs:  Normal respiratory effort, chest expands symmetrically. Lungs are clear to auscultation, no crackles or wheezes. Heart:  Normal rate and regular rhythm. S1 and S2 normal without gallop, murmur, click, rub or other extra sounds. Abdomen:  soft, non-tender, normal bowel sounds, no distention, no masses, no guarding, no rigidity, no hepatomegaly, and no splenomegaly.   Msk:  normal ROM, no joint tenderness, no joint swelling, no joint warmth, no redness over joints, no joint deformities, no joint instability, no crepitation, and no muscle atrophy.   Pulses:  R and L carotid,radial,femoral,dorsalis pedis and posterior tibial pulses are full and equal bilaterally Extremities:  No clubbing, cyanosis, edema, or deformity noted with normal full range of motion of all joints.   Neurologic:  alert & oriented X3, cranial nerves II-XII intact, strength normal in all extremities, sensation intact to light touch, sensation intact to pinprick, DTRs symmetrical and normal, finger-to-nose normal, heel-to-shin normal, toes are equivocal on Babinski, Romberg negative, and ataxic gait.   Skin:  turgor normal, color normal, no rashes, no suspicious lesions, no ecchymoses, no petechiae, no purpura, no ulcerations, and no edema.   Cervical Nodes:  no anterior cervical adenopathy and no posterior cervical adenopathy.   Psych:  Cognition and judgment appear intact. Alert and cooperative with normal attention span and concentration. No apparent delusions, illusions, hallucinations   Impression & Recommendations:  Problem # 1:  HEADACHE (ICD-784.0) Assessment New I will scan her brain for cva, atrophy,  hydrocephalus, mass, demyelination, etc Her updated medication list for this problem includes:    Bayer Childrens Aspirin 81 Mg Chew (Aspirin) ..... Once daily    Bystolic 10 Mg Tabs (Nebivolol hcl) ..... One by mouth once daily for high blood pressure  Orders: Radiology Referral (Radiology)  Problem # 2:  LACK OF COORDINATION (ICD-781.3) Assessment: New  Orders: Radiology Referral (Radiology)  Problem # 3:  HEAD TRAUMA, CLOSED (ICD-959.01) Assessment: New  Orders: Radiology Referral (Radiology)  Problem # 4:  HYPERTENSION (ICD-401.9) Assessment: Improved  Her updated medication list for this problem includes:    Bystolic 10 Mg Tabs (Nebivolol hcl) ..... One by mouth once daily for high blood pressure    Dyazide 37.5-25 Mg Caps (Triamterene-hctz) ..... One by mouth once daily for high blood pressure  BP today: 120/82 Prior BP: 128/68 (05/30/2010)  Prior 10 Yr Risk Heart Disease: Not enough information (02/20/2010)  Labs Reviewed: K+: 3.7 (05/30/2010) Creat: : 0.9 (05/30/2010)   Chol: 227 (05/30/2010)   HDL: 57.10 (05/30/2010)   TG: 119.0 (05/30/2010)  Problem # 5:  VERTIGO (ICD-780.4) Assessment: New  Her updated medication list for this problem includes:    Meclizine Hcl 25 Mg Tabs (Meclizine hcl) ..... One three times a day as needed for vertigo  Demonstrated maneuvers to self-treat vertigo. Patient to call to be seen if no improvement in 10-14 days, sooner if worse.   Problem # 6:  ANXIETY (ICD-300.00) Assessment: New  Her updated medication list for this problem includes:    Klonopin 0.5 Mg Tabs (Clonazepam) ..... One by mouth two times a day as needed for anxiety  Discussed medication use and relaxation techniques.   Complete Medication List: 1)  Bayer Childrens Aspirin 81 Mg Chew (Aspirin) .... Once daily 2)  Icaps Areds Formula Tabs (Multiple vitamins-minerals) .... Take one tablet by mouth once daily. 3)  Multivitamins Tabs (Multiple vitamin) .... Once  daily 4)  Bystolic 10 Mg Tabs (Nebivolol hcl) .... One by mouth once daily for high blood pressure 5)  Dyazide 37.5-25 Mg Caps (Triamterene-hctz) .... One by mouth once daily for high blood pressure 6)  Klonopin 0.5 Mg Tabs (Clonazepam) .... One by mouth two times a day as needed for anxiety 7)  Meclizine Hcl 25 Mg Tabs (Meclizine hcl) .... One three times a day as needed for vertigo  Hypertension Assessment/Plan:      The patient's hypertensive risk group is category C: Target organ damage and/or diabetes.  Today's blood pressure is 120/82.  Her blood pressure goal is < 140/90.  Patient Instructions: 1)  Please schedule a follow-up appointment in 2 weeks. 2)  Check your Blood Pressure regularly. If it is above 130/80: you should make an appointment. Prescriptions: MECLIZINE HCL 25 MG TABS (MECLIZINE HCL) One three times a day as needed for vertigo  #50 x 11   Entered and Authorized by:   Etta Grandchild MD   Signed by:   Etta Grandchild MD on 06/26/2010   Method used:   Print then Give to Patient   RxID:   [id number] KLONOPIN 0.5 MG TABS (CLONAZEPAM) One by mouth two times a day as needed for anxiety  #  60 x 5   Entered and Authorized by:   Etta Grandchild MD   Signed by:   Etta Grandchild MD on 06/26/2010   Method used:   Print then Give to Patient   RxID:   [id number]    Orders Added: 1)  Radiology Referral [Radiology] 2)  Est. Patient Level IV [14782]

## 2010-07-24 NOTE — Progress Notes (Signed)
Summary: rf  Phone Note Call from Patient Call back at Work Phone [phone]   Summary of Call: Patient is requesting a call, needs refill of bp med.  Initial call taken by: Lamar Sprinkles, CMA,  July 11, 2010 11:32 AM  Follow-up for Phone Call        Pt requested refill of Bystolic, rx sent to pharmacy, pt aware. Follow-up by: Margaret Pyle, CMA,  July 11, 2010 12:39 PM    Prescriptions: BYSTOLIC 10 MG TABS (NEBIVOLOL HCL) One by mouth once daily for high blood pressure  #90 x 1   Entered by:   Margaret Pyle, CMA   Authorized by:   Etta Grandchild MD   Signed by:   Margaret Pyle, CMA on 07/11/2010   Method used:   Electronically to        [facility]  [id number] (retail)       [address]       [place], [place]  [address]       Ph: [phone] or [phone]       Fax: [phone]   RxID:   [id number]

## 2010-07-24 NOTE — Letter (Signed)
Summary: Lipid Letter  [facility]  [address] [place], [place] [address]   Phone: [phone]  Fax: [phone]    06/01/2010  [name] [address] [place], [place]  [address]  Dear [name]:  We have carefully reviewed your last lipid profile from  and the results are noted below with a summary of recommendations for lipid management.    Cholesterol:       227     Goal: <200   HDL "good" Cholesterol:   62.95     Goal: >40   LDL "bad" Cholesterol:   134     Goal: <100   Triglycerides:       119.0     Goal: <150        TLC Diet (Therapeutic Lifestyle Change): Saturated Fats & Transfatty acids should be kept < 7% of total calories ***Reduce Saturated Fats Polyunstaurated Fat can be up to 10% of total calories Monounsaturated Fat Fat can be up to 20% of total calories Total Fat should be no greater than 25-35% of total calories Carbohydrates should be 50-60% of total calories Protein should be approximately 15% of total calories Fiber should be at least 20-30 grams a day ***Increased fiber may help lower LDL Total Cholesterol should be < 200mg /day Consider adding plant stanol/sterols to diet (example: Benacol spread) ***A higher intake of unsaturated fat may reduce Triglycerides and Increase HDL    Adjunctive Measures (may lower LIPIDS and reduce risk of Heart Attack) include: Aerobic Exercise (20-30 minutes 3-4 times a week) Limit Alcohol Consumption Weight Reduction Aspirin 75-81 mg a day by mouth (if not allergic or contraindicated) Dietary Fiber 20-30 grams a day by mouth     Current Medications: 1)    Bayer Childrens Aspirin 81 Mg  Chew (Aspirin) .... Once daily 2)    Icaps Areds Formula  Tabs (Multiple vitamins-minerals) .... Take one tablet by mouth once daily. 3)    Multivitamins   Tabs (Multiple vitamin) .... Once daily 4)    Bystolic 10 Mg Tabs (Nebivolol hcl) .... One by mouth once daily for high blood pressure 5)     Dyazide 37.5-25 Mg Caps (Triamterene-hctz) .... One by mouth once daily for high blood pressure  If you have any questions, please call. We appreciate being able to work with you.   Sincerely,    [facility] Etta Grandchild MD

## 2010-07-24 NOTE — Assessment & Plan Note (Signed)
Summary: PER PT D/T  FU---STC   Vital Signs:  Patient profile:   75 year old female Menstrual status:  postmenopausal Height:      68 inches Weight:      166 pounds BMI:     25.33 O2 Sat:      96 % on Room air Temp:     97.4 degrees F oral Pulse rate:   60 / minute Pulse rhythm:   regular Resp:     16 per minute BP sitting:   122 / 80  (left arm) Cuff size:   regular  Vitals Entered By: Rock Nephew CMA (July 16, 2010 3:35 PM)  Nutrition Counseling: Patient's BMI is greater than 25 and therefore counseled on weight management options.  O2 Flow:  Room air  Primary Care Provider:  Etta Grandchild MD   History of Present Illness: She returns for f/up and tells me that Meclizine does not help her dizziness and she wants to try something else. Also, she has intermittent headaches and she wants a medicine for that as well. Her MRI showed small vessel disease but no CVA.  Hypertension History:      She complains of headache, but denies chest pain, palpitations, dyspnea with exertion, orthopnea, PND, peripheral edema, neurologic problems, syncope, and side effects from treatment.  She notes no problems with any antihypertensive medication side effects.        Positive major cardiovascular risk factors include female age 38 years old or older, hyperlipidemia, and hypertension.  Negative major cardiovascular risk factors include no history of diabetes, negative family history for ischemic heart disease, and non-tobacco-user status.        Positive history for target organ damage include peripheral vascular disease.  Further assessment for target organ damage reveals no history of ASHD, cardiac end-organ damage (CHF/LVH), stroke/TIA, renal insufficiency, or hypertensive retinopathy.     Preventive Screening-Counseling & Management  Alcohol-Tobacco     Alcohol drinks/day: 0     Smoking Status: never     Tobacco Counseling: not indicated; no tobacco use  Hep-HIV-STD-Contraception     Hepatitis Risk: no risk noted     HIV Risk: no risk noted     STD Risk: no risk noted     SBE monthly: yes     SBE Education/Counseling: to perform regular SBE      Sexual History:  not active.        Drug Use:  no.        Blood Transfusions:  no.    Clinical Review Panels:  Prevention   Last Mammogram:  normal (08/20/2009)   Last Colonoscopy:  DONE (05/20/2010)  Immunizations   Last Tetanus Booster:  Historical (06/22/2009)   Last Pneumovax:  given (06/22/2002)  Lipid Management   Cholesterol:  227 (05/30/2010)   HDL (good cholesterol):  57.10 (05/30/2010)  Diabetes Management   Creatinine:  0.9 (05/30/2010)   Last Pneumovax:  given (06/22/2002)  CBC   WBC:  6.2 (05/30/2010)   RBC:  3.48 (05/30/2010)   Hgb:  11.6 (05/30/2010)   Hct:  34.1 (05/30/2010)   Platelets:  179.0 (05/30/2010)   MCV  98.2 (05/30/2010)   MCHC  34.0 (05/30/2010)   RDW  13.6 (05/30/2010)   PMN:  44.9 (05/30/2010)   Lymphs:  43.6 (05/30/2010)   Monos:  6.9 (05/30/2010)   Eosinophils:  4.0 (05/30/2010)   Basophil:  0.6 (05/30/2010)  Complete Metabolic Panel   Glucose:  89 (05/30/2010)   Sodium:  142 (  05/30/2010)   Potassium:  3.7 (05/30/2010)   Chloride:  105 (05/30/2010)   CO2:  28 (05/30/2010)   BUN:  15 (05/30/2010)   Creatinine:  0.9 (05/30/2010)   Albumin:  3.8 (05/30/2010)   Total Protein:  7.1 (05/30/2010)   Calcium:  9.0 (05/30/2010)   Total Bili:  0.5 (05/30/2010)   Alk Phos:  70 (05/30/2010)   SGPT (ALT):  17 (05/30/2010)   SGOT (AST):  20 (05/30/2010)   Medications Prior to Update: 1)  Bayer Childrens Aspirin 81 Mg  Chew (Aspirin) .... Once Daily 2)  Icaps Areds Formula  Tabs (Multiple Vitamins-Minerals) .... Take One Tablet By Mouth Once Daily. 3)  Multivitamins   Tabs (Multiple Vitamin) .... Once Daily 4)  Bystolic 10 Mg Tabs (Nebivolol Hcl) .... One By Mouth Once Daily For High Blood Pressure 5)  Dyazide 37.5-25 Mg Caps (Triamterene-Hctz) .... One By Mouth Once  Daily For High Blood Pressure 6)  Klonopin 0.5 Mg Tabs (Clonazepam) .... One By Mouth Two Times A Day As Needed For Anxiety 7)  Meclizine Hcl 25 Mg Tabs (Meclizine Hcl) .... One Three Times A Day As Needed For Vertigo  Current Medications (verified): 1)  Bayer Childrens Aspirin 81 Mg  Chew (Aspirin) .... Once Daily 2)  Icaps Areds Formula  Tabs (Multiple Vitamins-Minerals) .... Take One Tablet By Mouth Once Daily. 3)  Multivitamins   Tabs (Multiple Vitamin) .... Once Daily 4)  Bystolic 10 Mg Tabs (Nebivolol Hcl) .... One By Mouth Once Daily For High Blood Pressure 5)  Dyazide 37.5-25 Mg Caps (Triamterene-Hctz) .... One By Mouth Once Daily For High Blood Pressure 6)  Klonopin 0.5 Mg Tabs (Clonazepam) .... One By Mouth Two Times A Day As Needed For Anxiety 7)  Klor-Con 20 Meq Pack (Potassium Chloride) .... Once Daily 8)  Transderm-Scop 1.5 Mg Pt72 (Scopolamine Base) .... Apply One Q 3 Days For Vertigo 9)  Tramadol Hcl 50 Mg Tabs (Tramadol Hcl) .Marland Kitchen.. 1-2 By Mouth Qid Prn For Headache  Allergies (verified): 1)  ! Motrin 2)  ! Crestor (Rosuvastatin Calcium)  Past History:  Past Medical History: Last updated: 06/26/2010 ALPITATIONS (ICD-785.1) HYPERTENSION (ICD-401.9) MACULAR DEGENERATION (ICD-362.50) THYROID NODULE (ICD-241.0) FIBROCYSTIC BREAST DISEASE (ICD-610.1)    Hyperlipidemia Anxiety  Past Surgical History: Last updated: 01/31/2010 Knee  (Rt.) replacement 9 years ago.    Family History: Last updated: 01/24/2010 neg for CAD Family History of Coronary Artery Disease:  no colon cancer  Social History: Last updated: 01/31/2010  The patient does not smoke, does not drink.  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 neg for CAD Family History of Coronary Artery Disease:  no colon cancer  Social History: Reviewed history from 01/31/2010 and no changes required.  The patient does not smoke, does not drink.  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   Neuro:  Complains of headaches; denies difficulty with concentration, disturbances in  coordination, falling down, numbness, poor balance, tingling, tremors, visual disturbances, and weakness.  Physical Exam  General:  alert, well-developed, well-nourished, well-hydrated, appropriate dress, normal appearance, healthy-appearing, cooperative to examination, good hygiene, and overweight-appearing.   Head:  normocephalic, atraumatic, no abnormalities observed, no abnormalities palpated, and no alopecia.   Mouth:  Oral mucosa and oropharynx without lesions or exudates.  Teeth in good repair. Neck:  supple, full ROM, no masses, no thyromegaly, no thyroid nodules or tenderness, no JVD, normal carotid upstroke, no carotid bruits, no cervical lymphadenopathy, and no neck tenderness.   Lungs:  Normal respiratory effort, chest expands symmetrically. Lungs are clear to auscultation, no crackles or wheezes. Heart:  Normal rate and regular rhythm. S1 and S2 normal without gallop, murmur, click, rub or other extra sounds. Abdomen:  soft, non-tender, normal bowel sounds, no distention, no masses, no guarding, no rigidity, no hepatomegaly, and no splenomegaly.   Msk:  normal ROM, no  joint tenderness, no joint swelling, no joint warmth, no redness over joints, no joint deformities, no joint instability, no crepitation, and no muscle atrophy.   Pulses:  R and L carotid,radial,femoral,dorsalis pedis and posterior tibial pulses are full and equal bilaterally Extremities:  No clubbing, cyanosis, edema, or deformity noted with normal full range of motion of all joints.   Neurologic:  alert & oriented X3, cranial nerves II-XII intact, strength normal in all extremities, sensation intact to light touch, sensation intact to pinprick, gait normal, DTRs symmetrical and normal, finger-to-nose normal, heel-to-shin normal, and Romberg negative.   Skin:  turgor normal, color normal, no rashes, no suspicious lesions, no ecchymoses, no petechiae, no purpura, no ulcerations, and no edema.   Cervical Nodes:  no anterior cervical adenopathy and no posterior cervical adenopathy.   Psych:  Cognition and judgment appear intact. Alert and cooperative with normal attention span and concentration. No apparent delusions, illusions, hallucinations   Impression & Recommendations:  Problem # 1:  VERTIGO (ICD-780.4) Assessment Unchanged  The following medications were removed from the medication list:    Meclizine Hcl 25 Mg Tabs (Meclizine hcl) ..... One three times a day as needed for vertigo Her updated medication list for this problem includes:    Transderm-scop 1.5 Mg Pt72 (Scopolamine base) .Marland Kitchen... Apply one q 3 days for vertigo  Problem # 2:  HEADACHE (ICD-784.0) Assessment: Unchanged  Her updated medication list for this problem includes:    Bayer Childrens Aspirin 81 Mg Chew (Aspirin) ..... Once daily    Bystolic 10 Mg Tabs (Nebivolol hcl) ..... One by mouth once daily for high blood pressure    Tramadol Hcl 50 Mg Tabs (Tramadol hcl) .Marland Kitchen... 1-2 by mouth qid prn for headache  Problem # 3:  HYPERTENSION (ICD-401.9) Assessment: Improved  Her updated medication list for this problem includes:     Bystolic 10 Mg Tabs (Nebivolol hcl) ..... One by mouth once daily for high blood pressure    Dyazide 37.5-25 Mg Caps (Triamterene-hctz) ..... One by mouth once daily for high blood pressure  BP today: 122/80 Prior BP: 120/82 (06/26/2010)  Prior 10 Yr Risk Heart Disease: Not enough information (02/20/2010)  Labs Reviewed: K+: 3.7 (05/30/2010) Creat: : 0.9 (05/30/2010)   Chol: 227 (05/30/2010)   HDL: 57.10 (05/30/2010)   TG: 119.0 (05/30/2010)  Complete Medication List: 1)  Bayer Childrens Aspirin 81 Mg Chew (Aspirin) .... Once daily 2)  Icaps Areds Formula Tabs (Multiple vitamins-minerals) .... Take one tablet by mouth once daily. 3)  Multivitamins Tabs (Multiple vitamin) .... Once daily  4)  Bystolic 10 Mg Tabs (Nebivolol hcl) .... One by mouth once daily for high blood pressure 5)  Dyazide 37.5-25 Mg Caps (Triamterene-hctz) .... One by mouth once daily for high blood pressure 6)  Klonopin 0.5 Mg Tabs (Clonazepam) .... One by mouth two times a day as needed for anxiety 7)  Klor-con 20 Meq Pack (Potassium chloride) .... Once daily 8)  Transderm-scop 1.5 Mg Pt72 (Scopolamine base) .... Apply one q 3 days for vertigo 9)  Tramadol Hcl 50 Mg Tabs (Tramadol hcl) .Marland Kitchen.. 1-2 by mouth qid prn for headache  Hypertension Assessment/Plan:      The patient's hypertensive risk group is category C: Target organ damage and/or diabetes.  Today's blood pressure is 122/80.  Her blood pressure goal is < 140/90.  Patient Instructions: 1)  Please schedule a follow-up appointment in 2 months. 2)  Check your Blood Pressure regularly. If it is above 130/80: you should make an appointment. Prescriptions: TRAMADOL HCL 50 MG TABS (TRAMADOL HCL) 1-2 by mouth QID prn for headache  #75 x 5   Entered and Authorized by:   Etta Grandchild MD   Signed by:   Etta Grandchild MD on 07/16/2010   Method used:   Print then Give to Patient   RxID:   [id number] TRANSDERM-SCOP 1.5 MG PT72 (SCOPOLAMINE BASE) Apply one q  3 days for vertigo  #12 x 5   Entered and Authorized by:   Etta Grandchild MD   Signed by:   Etta Grandchild MD on 07/16/2010   Method used:   Print then Give to Patient   RxID:   [id number] KLOR-CON 20 MEQ PACK (POTASSIUM CHLORIDE) once daily  #3mo x 6   Entered by:   Rock Nephew CMA   Authorized by:   Etta Grandchild MD   Signed by:   Rock Nephew CMA on 07/16/2010   Method used:   [facility] to        [facility]  [address] [id number]       [address]       [place], [place]  [address]       Ph: [phone] or [phone]       Fax: [phone]   RxID:   [id number]    Orders Added: 1)  Est. Patient Level IV [76283]

## 2010-07-25 ENCOUNTER — Other Ambulatory Visit: Payer: Self-pay | Admitting: General Surgery

## 2010-07-25 ENCOUNTER — Ambulatory Visit (HOSPITAL_COMMUNITY)
Admission: RE | Admit: 2010-07-25 | Discharge: 2010-07-25 | Disposition: A | Discharge status: Home / Self Care | Payer: No Typology Code available for payment source | Source: Ambulatory Visit | Attending: General Surgery | Admitting: General Surgery

## 2010-07-25 DIAGNOSIS — K649 Unspecified hemorrhoids: Secondary | ICD-10-CM | POA: Insufficient documentation

## 2010-07-25 DIAGNOSIS — R198 Other specified symptoms and signs involving the digestive system and abdomen: Secondary | ICD-10-CM | POA: Insufficient documentation

## 2010-08-01 NOTE — Op Note (Signed)
  NAME:  [name], [name] NO.:  [id number]  MEDICAL RECORD NO.:  [id number]           PATIENT TYPE:  O  LOCATION:  [facility]                         FACILITY:  [facility]  PHYSICIAN:  Ollen Gross. Vernell Morgans, M.D. DATE OF BIRTH:  [birth year]  DATE OF PROCEDURE:  07/25/2010 DATE OF DISCHARGE:  07/25/2010                              OPERATIVE REPORT   PREOPERATIVE DIAGNOSIS:  Rectal mass.  POSTOPERATIVE DIAGNOSIS:  Rectal mass.  PROCEDURE:  Transanal excision of rectal mass.  SURGEON:  Ollen Gross. Vernell Morgans, MD  ANESTHESIA:  General endotracheal.  PROCEDURE:  After informed consent was obtained, the patient was brought to the operating room, the left in the supine position on the stretcher. After adequate induction of general anesthesia, the patient was moved into a prone position on the operating room table and all pressure points were padded.  The patient was placed in sort of a jackknife type position.  Her buttocks were retracted laterally with tape.  The perirectal region was then prepped with Betadine and draped in usual sterile manner.  Perirectal region was infiltrated with 0.25% Marcaine with epinephrine.  A small bullet retractor was inserted into the rectum and the rectum was examined circumferentially.  Posteriorly, the patient had sort of a large polypoid-like hemorrhoid-looking structure.  This was easily able to be elevated.  The area itself was infiltrated with 0.25% Marcaine.  The polypoid mass was scored along near its base with a 15-blade knife, and a hemostat was placed across the base of the mass. Mass was excised distal to the hemostat with the Metzenbaum scissors and sent to Pathology and then the mass was sent to Pathology for further evaluation.  The incision was closed with a running 2-0 chromic stitch as well as with a couple of interrupted 2-0 chromic stitches.  The last couple millimeters of the incision externally was left open and fulgurated with the  cautery.  Small piece of Gelfoam and dibucaine ointment were applied along with sterile dressings.  The patient tolerated the procedure well.  At the end of the case, all needle, sponge, instrument counts were correct.  The patient was then awakened and taken to recovery in stable condition.     Ollen Gross. Vernell Morgans, M.D.     PST/MEDQ  D:  07/25/2010  T:  07/25/2010  Job:  [id number]  Electronically Signed by Chevis Pretty III M.D. on 08/01/2010 06:57:19 AM

## 2010-08-07 ENCOUNTER — Ambulatory Visit (INDEPENDENT_AMBULATORY_CARE_PROVIDER_SITE_OTHER): Payer: PRIVATE HEALTH INSURANCE | Admitting: Internal Medicine

## 2010-08-07 ENCOUNTER — Encounter: Payer: Self-pay | Admitting: Internal Medicine

## 2010-08-07 DIAGNOSIS — E7800 Pure hypercholesterolemia, unspecified: Secondary | ICD-10-CM

## 2010-08-07 DIAGNOSIS — I712 Thoracic aortic aneurysm, without rupture: Secondary | ICD-10-CM

## 2010-08-07 DIAGNOSIS — I10 Essential (primary) hypertension: Secondary | ICD-10-CM

## 2010-08-07 DIAGNOSIS — I471 Supraventricular tachycardia: Secondary | ICD-10-CM | POA: Insufficient documentation

## 2010-08-11 ENCOUNTER — Other Ambulatory Visit: Payer: PRIVATE HEALTH INSURANCE

## 2010-08-13 ENCOUNTER — Other Ambulatory Visit: Payer: Self-pay | Admitting: Internal Medicine

## 2010-08-13 ENCOUNTER — Other Ambulatory Visit: Payer: PRIVATE HEALTH INSURANCE

## 2010-08-13 ENCOUNTER — Encounter (INDEPENDENT_AMBULATORY_CARE_PROVIDER_SITE_OTHER): Payer: Self-pay | Admitting: *Deleted

## 2010-08-13 DIAGNOSIS — I10 Essential (primary) hypertension: Secondary | ICD-10-CM

## 2010-08-13 LAB — BASIC METABOLIC PANEL
BUN: 17 mg/dL (ref 6–23)
CO2: 28 mEq/L (ref 19–32)
Calcium: 9 mg/dL (ref 8.4–10.5)
Chloride: 106 mEq/L (ref 96–112)
Creatinine, Ser: 1 mg/dL (ref 0.4–1.2)
GFR: 74.26 mL/min (ref >60.00)
Glucose, Bld: 96 mg/dL (ref 70–99)
Potassium: 3.8 mEq/L (ref 3.5–5.1)
Sodium: 141 mEq/L (ref 135–145)

## 2010-08-18 ENCOUNTER — Encounter: Payer: Self-pay | Admitting: Internal Medicine

## 2010-08-19 NOTE — Assessment & Plan Note (Addendum)
Summary: F36m per pt call=mj   Visit Type:  Follow-up Referring Provider:  Ledora Bottcher Primary Provider:  Etta Grandchild MD  CC:  No complaints.  History of Present Illness: patient is a 75 year old.  I saw her back in 2010.  She has been seen by G. Ladona Ridgel in the interval.  In 12/10 she underwent EP evaluation.  No SVT was inducible.  She has been followed on medicne since. Note she had a myoview this past year taht showed no signif ischemia. Since seen she has had an echo that showed mild dilation of the ascending aorta.  LVEF was normal.  She had mild to modeate AIShe went on to have a CT that showed dilitations as well. Since seen she denies signif palpitations.  Breathing is good.  NO chest pain. Recent labs show LDL is 134.  Current Medications (verified): 1)  Bayer Childrens Aspirin 81 Mg  Chew (Aspirin) .... Once Daily 2)  Icaps Areds Formula  Tabs (Multiple Vitamins-Minerals) .... Take One Tablet By Mouth Once Daily. 3)  Multivitamins   Tabs (Multiple Vitamin) .... Once Daily 4)  Bystolic 10 Mg Tabs (Nebivolol Hcl) .... 2 Tablets Am and 1 Tablet Pm 5)  Dyazide 37.5-25 Mg Caps (Triamterene-Hctz) .... One By Mouth Once Daily For High Blood Pressure 6)  Klonopin 0.5 Mg Tabs (Clonazepam) .... One By Mouth Two Times A Day As Needed For Anxiety 7)  Potassium Chloride Crys Cr 20 Meq Cr-Tabs (Potassium Chloride Crys Cr) .... Take One Tablet By Mouth Daily  Allergies: 1)  ! Motrin 2)  ! Crestor (Rosuvastatin Calcium)  Past History:  Past medical, surgical, family and social histories (including risk factors) reviewed, and no changes noted (except as noted below).  Past Medical History: Reviewed history from 06/26/2010 and no changes required. ALPITATIONS (ICD-785.1) HYPERTENSION (ICD-401.9) MACULAR DEGENERATION (ICD-362.50) THYROID NODULE (ICD-241.0) FIBROCYSTIC BREAST DISEASE (ICD-610.1)    Hyperlipidemia Anxiety  Past Surgical History: Reviewed history from 01/31/2010  and no changes required. Knee  (Rt.) replacement 9 years ago.    Family History: Reviewed history from 01/24/2010 and no changes required. neg for CAD Family History of Coronary Artery Disease:  no colon cancer  Social History: Reviewed history from 01/31/2010 and no changes required.  The patient does not smoke, does not drink.  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  Neg to the above problme except as noted above.  Vital Signs:  Patient profile:   75 year old female Menstrual status:  postmenopausal Height:      68 inches Weight:      166 pounds BMI:     25.33 Pulse rate:   58 / minute Pulse rhythm:   regular Resp:     18 per minute BP sitting:   116 / 72  (left arm) Cuff size:   large  Vitals Entered By: Vikki Ports (August 07, 2010 4:45 PM)  Physical Exam  Additional Exam:  Patient is in NAD HEENT:  Normocephalic, atraumatic. EOMI, PERRLA.  Neck: JVP is normal. No thyromegaly. No bruits.  Lungs: clear to auscultation. No rales no wheezes.  Heart: Regular rate and rhythm. Normal S1, S2. No S3.  Gr I/Vi systolic murmur.  Marland Kitchen PMI not displaced.  Abdomen:  Supple, nontender. Normal bowel sounds. No masses. No hepatomegaly.  Extremities:   Good distal pulses  throughout. No lower extremity edema.  Musculoskeletal :moving all extremities.  Neuro:   alert and oriented x3.    EKG  Procedure date:  08/07/2010  Findings:      Sinus bradycardia.  58 bpm.  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  Problem # 2:  HYPERLIPIDEMIA (ICD-272.4) Needs to be on medical RX.  Didin't tolerate Crestor or ? Zocor.  Will try pravachol.  Check lipids in 8 wks. Her updated medication list for  this problem includes:    Pravastatin Sodium 20 Mg Tabs (Pravastatin sodium) .Marland Kitchen... 1 tab every pm  Problem # 3:  SVT/ PSVT/ PAT (ICD-427.0) Patient remains relatively asymptomatic.    Other Orders: EKG w/ Interpretation (93000)  Patient Instructions: 1)  Your physician recommends that you return for lab work in:lab work at [facility]. 2)  Your physician wants you to follow-up in: 6 months with Dr.Jaquese Irving  You will receive a reminder letter in the mail two months in advance. If you don't receive a letter, please call our office to schedule the follow-up appointment. Prescriptions: PRAVASTATIN SODIUM 20 MG TABS (PRAVASTATIN SODIUM) 1 tab every PM  #30 x 6   Entered by:   Layne Benton, RN, BSN   Authorized by:   Sherrill Raring, MD, [facility]   Signed by:   Layne Benton, RN, BSN on 08/07/2010   Method used:   [facility]  [address] [id number]       [address]       [place], [place]  [address]       Ph: [phone] or [phone]       Fax: [phone]   RxID:   [id number]   Appended Document: F15m per pt call=mj Will need f/u CT of chest to evaluate aorta in May 2012.  Appended Document: F42m per pt call=mj Order sent to [facility].

## 2010-08-20 ENCOUNTER — Telehealth: Payer: Self-pay | Admitting: Internal Medicine

## 2010-08-22 ENCOUNTER — Encounter: Payer: Self-pay | Admitting: Internal Medicine

## 2010-08-26 ENCOUNTER — Telehealth: Payer: Self-pay | Admitting: Internal Medicine

## 2010-08-27 ENCOUNTER — Encounter: Payer: Self-pay | Admitting: Internal Medicine

## 2010-08-28 NOTE — Progress Notes (Signed)
Summary: calling re change of med  Phone Note Call from Patient   Caller: Patient [phone] Reason for Call: Talk to Nurse Summary of Call: pt has questions [phone] re change of med Initial call taken by: Glynda Jaeger,  August 20, 2010 4:58 PM  Follow-up for Phone Call        Left message to call back Meredith Staggers, RN  August 20, 2010 5:08 PM   Additional Follow-up for Phone Call Additional follow up Details #1::        Called patient back. She needs a refill for Bystolic. Will send to pharmacy.  Layne Benton, RN, BSN  August 21, 2010 1:13 PM     New/Updated Medications: BYSTOLIC 10 MG TABS (NEBIVOLOL HCL) 1 tablet in the AM and 1 tablet in the PM Prescriptions: BYSTOLIC 10 MG TABS (NEBIVOLOL HCL) 1 tablet in the AM and 1 tablet in the PM  #60 x 6   Entered by:   Layne Benton, RN, BSN   Authorized by:   Sherrill Raring, MD, [facility]   Signed by:   Layne Benton, RN, BSN on 08/21/2010   Method used:   [facility]  [address] [id number]       [address]       [place], [place]  [address]       Ph: [phone] or [phone]       Fax: [phone]   RxID:   [id number]

## 2010-08-28 NOTE — Miscellaneous (Signed)
  Clinical Lists Changes  Orders: Added new Referral order of CT Scan  (CT Scan) - Signed

## 2010-09-02 NOTE — Medication Information (Signed)
Summary: prescriber response form   prescriber response form   Imported By: Kassie Mends 08/27/2010 11:52:05  _____________________________________________________________________  External Attachment:    Type:   Image     Comment:   External Document

## 2010-09-02 NOTE — Miscellaneous (Addendum)
  Clinical Lists Changes  Medications: Removed medication of PRAVASTATIN SODIUM 20 MG TABS (PRAVASTATIN SODIUM) 1 tab every PM

## 2010-09-02 NOTE — Progress Notes (Addendum)
Summary: question on mes  Phone Note Call from Patient Call back at [facility] Phone [phone]   Caller: Patient Reason for Call: Talk to Nurse Summary of Call: pt has question re meds. pt states meds are making her muscles hurt. Initial call taken by: Roe Coombs,  August 26, 2010 4:30 PM  Follow-up for Phone Call        08/26/10--1645pm--pt calling stating the statin dr Tenny Craw put her on is making muscles hurt--advised--take at night,and try cutting med in 1/2 to see if you tolerate a lower dose--also advised i would let dr Tenny Craw and her nurse know about complaint and someone would call her back within the week--pt agrees Follow-up by: Ledon Snare, RN,  August 26, 2010 4:45 PM     Appended Document: question on mes I would recommend stopping medicine.  See if achienss goes away. QUestion if she has tried Lipitor  Appended Document: question on mes Called patient back. She will stop Pravachol and let us know how she feels in a few weeks. She states that she had the same reaction to Crestor. She has not tried Lipitor and does not want to! Will let Dr.Ross know.

## 2010-09-04 LAB — HEMOGLOBIN A1C
Hgb A1c MFr Bld: 5.7 % — ABNORMAL HIGH (ref <5.7)
Mean Plasma Glucose: 117 mg/dL — ABNORMAL HIGH (ref <117)

## 2010-09-04 LAB — LIPID PANEL
Cholesterol: 179 mg/dL (ref 0–200)
HDL: 62 mg/dL (ref >39)
LDL Cholesterol: 103 mg/dL — ABNORMAL HIGH (ref 0–99)
Total CHOL/HDL Ratio: 2.9 RATIO
Triglycerides: 70 mg/dL (ref <150)
VLDL: 14 mg/dL (ref 0–40)

## 2010-09-04 LAB — BASIC METABOLIC PANEL
BUN: 10 mg/dL (ref 6–23)
CO2: 26 mEq/L (ref 19–32)
Calcium: 9 mg/dL (ref 8.4–10.5)
Chloride: 99 mEq/L (ref 96–112)
Creatinine, Ser: 0.86 mg/dL (ref 0.4–1.2)
GFR calc Af Amer: >60 mL/min (ref >60)
GFR calc non Af Amer: >60 mL/min (ref >60)
Glucose, Bld: 122 mg/dL — ABNORMAL HIGH (ref 70–99)
Potassium: 3.3 mEq/L — ABNORMAL LOW (ref 3.5–5.1)
Sodium: 132 mEq/L — ABNORMAL LOW (ref 135–145)

## 2010-09-04 LAB — POCT CARDIAC MARKERS
CKMB, poc: <1 ng/mL — ABNORMAL LOW (ref 1.0–8.0)
CKMB, poc: <1 ng/mL — ABNORMAL LOW (ref 1.0–8.0)
Myoglobin, poc: 55.2 ng/mL (ref 12–200)
Myoglobin, poc: 55.6 ng/mL (ref 12–200)
Troponin i, poc: <0.05 ng/mL (ref 0.00–0.09)
Troponin i, poc: <0.05 ng/mL (ref 0.00–0.09)

## 2010-09-04 LAB — CBC
HCT: 35.1 % — ABNORMAL LOW (ref 36.0–46.0)
HCT: 36.4 % (ref 36.0–46.0)
Hemoglobin: 12.1 g/dL (ref 12.0–15.0)
Hemoglobin: 12.5 g/dL (ref 12.0–15.0)
MCH: 33.8 pg (ref 26.0–34.0)
MCH: 33.8 pg (ref 26.0–34.0)
MCHC: 34.4 g/dL (ref 30.0–36.0)
MCHC: 34.5 g/dL (ref 30.0–36.0)
MCV: 98 fL (ref 78.0–100.0)
MCV: 98.1 fL (ref 78.0–100.0)
Platelets: 185 10*3/uL (ref 150–400)
Platelets: 186 10*3/uL (ref 150–400)
RBC: 3.58 MIL/uL — ABNORMAL LOW (ref 3.87–5.11)
RBC: 3.71 MIL/uL — ABNORMAL LOW (ref 3.87–5.11)
RDW: 13.2 % (ref 11.5–15.5)
RDW: 13.3 % (ref 11.5–15.5)
WBC: 7.1 10*3/uL (ref 4.0–10.5)
WBC: 7.7 10*3/uL (ref 4.0–10.5)

## 2010-09-04 LAB — TSH: TSH: 0.68 u[IU]/mL (ref 0.350–4.500)

## 2010-09-04 LAB — DIFFERENTIAL
Basophils Absolute: 0 10*3/uL (ref 0.0–0.1)
Basophils Absolute: 0 10*3/uL (ref 0.0–0.1)
Basophils Relative: 0 % (ref 0–1)
Basophils Relative: 0 % (ref 0–1)
Eosinophils Absolute: 0.1 10*3/uL (ref 0.0–0.7)
Eosinophils Absolute: 0.1 10*3/uL (ref 0.0–0.7)
Eosinophils Relative: 1 % (ref 0–5)
Eosinophils Relative: 2 % (ref 0–5)
Lymphocytes Relative: 20 % (ref 12–46)
Lymphocytes Relative: 22 % (ref 12–46)
Lymphs Abs: 1.4 10*3/uL (ref 0.7–4.0)
Lymphs Abs: 1.7 10*3/uL (ref 0.7–4.0)
Monocytes Absolute: 0.6 10*3/uL (ref 0.1–1.0)
Monocytes Absolute: 0.6 10*3/uL (ref 0.1–1.0)
Monocytes Relative: 8 % (ref 3–12)
Monocytes Relative: 8 % (ref 3–12)
Neutro Abs: 5 10*3/uL (ref 1.7–7.7)
Neutro Abs: 5.2 10*3/uL (ref 1.7–7.7)
Neutrophils Relative %: 67 % (ref 43–77)
Neutrophils Relative %: 71 % (ref 43–77)

## 2010-09-04 LAB — CK TOTAL AND CKMB (NOT AT ARMC)
CK, MB: 1.7 ng/mL (ref 0.3–4.0)
Relative Index: 1.2 (ref 0.0–2.5)
Total CK: 139 U/L (ref 7–177)

## 2010-09-04 LAB — D-DIMER, QUANTITATIVE: D-Dimer, Quant: 0.96 ug/mL-FEU — ABNORMAL HIGH (ref 0.00–0.48)

## 2010-09-04 LAB — MAGNESIUM: Magnesium: 2.2 mg/dL (ref 1.5–2.5)

## 2010-09-04 LAB — PHOSPHORUS: Phosphorus: 3.1 mg/dL (ref 2.3–4.6)

## 2010-09-04 LAB — TROPONIN I: Troponin I: 0.02 ng/mL (ref 0.00–0.06)

## 2010-09-12 ENCOUNTER — Emergency Department (HOSPITAL_COMMUNITY): Payer: No Typology Code available for payment source

## 2010-09-12 ENCOUNTER — Inpatient Hospital Stay (INDEPENDENT_AMBULATORY_CARE_PROVIDER_SITE_OTHER)
Admission: RE | Admit: 2010-09-12 | Discharge: 2010-09-12 | Disposition: A | Discharge status: Home / Self Care | Payer: No Typology Code available for payment source | Source: Ambulatory Visit | Attending: Family Medicine | Admitting: Family Medicine

## 2010-09-12 ENCOUNTER — Emergency Department (HOSPITAL_COMMUNITY)
Admission: EM | Admit: 2010-09-12 | Discharge: 2010-09-13 | Disposition: A | Discharge status: Home / Self Care | Payer: No Typology Code available for payment source | Attending: Emergency Medicine | Admitting: Emergency Medicine

## 2010-09-12 DIAGNOSIS — I10 Essential (primary) hypertension: Secondary | ICD-10-CM | POA: Insufficient documentation

## 2010-09-12 DIAGNOSIS — K209 Esophagitis, unspecified without bleeding: Secondary | ICD-10-CM | POA: Insufficient documentation

## 2010-09-12 DIAGNOSIS — R079 Chest pain, unspecified: Secondary | ICD-10-CM | POA: Insufficient documentation

## 2010-09-12 DIAGNOSIS — I440 Atrioventricular block, first degree: Secondary | ICD-10-CM | POA: Insufficient documentation

## 2010-09-12 LAB — COMPREHENSIVE METABOLIC PANEL
ALT: 14 U/L (ref 0–35)
AST: 19 U/L (ref 0–37)
Albumin: 3.6 g/dL (ref 3.5–5.2)
Alkaline Phosphatase: 65 U/L (ref 39–117)
BUN: 8 mg/dL (ref 6–23)
CO2: 24 mEq/L (ref 19–32)
Calcium: 8.8 mg/dL (ref 8.4–10.5)
Chloride: 103 mEq/L (ref 96–112)
Creatinine, Ser: 0.79 mg/dL (ref 0.4–1.2)
GFR calc Af Amer: >60 mL/min (ref >60)
GFR calc non Af Amer: >60 mL/min (ref >60)
Glucose, Bld: 88 mg/dL (ref 70–99)
Potassium: 4 mEq/L (ref 3.5–5.1)
Sodium: 134 mEq/L — ABNORMAL LOW (ref 135–145)
Total Bilirubin: 0.4 mg/dL (ref 0.3–1.2)
Total Protein: 6.8 g/dL (ref 6.0–8.3)

## 2010-09-12 LAB — POCT CARDIAC MARKERS
CKMB, poc: 1.2 ng/mL (ref 1.0–8.0)
CKMB, poc: 1.4 ng/mL (ref 1.0–8.0)
Myoglobin, poc: 75 ng/mL (ref 12–200)
Myoglobin, poc: 90.9 ng/mL (ref 12–200)
Troponin i, poc: 0.12 ng/mL — ABNORMAL HIGH (ref 0.00–0.09)
Troponin i, poc: 0.15 ng/mL — ABNORMAL HIGH (ref 0.00–0.09)

## 2010-09-12 LAB — CBC
HCT: 31.6 % — ABNORMAL LOW (ref 36.0–46.0)
Hemoglobin: 11.3 g/dL — ABNORMAL LOW (ref 12.0–15.0)
MCH: 32.8 pg (ref 26.0–34.0)
MCHC: 35.8 g/dL (ref 30.0–36.0)
MCV: 91.9 fL (ref 78.0–100.0)
Platelets: 166 10*3/uL (ref 150–400)
RBC: 3.44 MIL/uL — ABNORMAL LOW (ref 3.87–5.11)
RDW: 12.9 % (ref 11.5–15.5)
WBC: 5.3 10*3/uL (ref 4.0–10.5)

## 2010-09-12 LAB — DIFFERENTIAL
Basophils Absolute: 0 10*3/uL (ref 0.0–0.1)
Basophils Relative: 1 % (ref 0–1)
Eosinophils Absolute: 0.2 10*3/uL (ref 0.0–0.7)
Eosinophils Relative: 3 % (ref 0–5)
Lymphocytes Relative: 40 % (ref 12–46)
Lymphs Abs: 2.1 10*3/uL (ref 0.7–4.0)
Monocytes Absolute: 0.3 10*3/uL (ref 0.1–1.0)
Monocytes Relative: 6 % (ref 3–12)
Neutro Abs: 2.7 10*3/uL (ref 1.7–7.7)
Neutrophils Relative %: 50 % (ref 43–77)

## 2010-09-12 LAB — URINALYSIS, ROUTINE W REFLEX MICROSCOPIC
Bilirubin Urine: NEGATIVE
Glucose, UA: NEGATIVE mg/dL
Hgb urine dipstick: NEGATIVE
Ketones, ur: NEGATIVE mg/dL
Nitrite: NEGATIVE
Protein, ur: NEGATIVE mg/dL
Specific Gravity, Urine: 1.006 (ref 1.005–1.030)
Urobilinogen, UA: 0.2 mg/dL (ref 0.0–1.0)
pH: 7 (ref 5.0–8.0)

## 2010-09-12 LAB — LIPASE, BLOOD: Lipase: 33 U/L (ref 11–59)

## 2010-09-13 MED ORDER — IOHEXOL 300 MG/ML  SOLN
100.0000 mL | Freq: Once | INTRAMUSCULAR | Status: AC | PRN
  Administered 2010-09-13: 100 mL via INTRAVENOUS

## 2010-09-14 LAB — URINE CULTURE
Colony Count: NO GROWTH
Culture  Setup Time: 201203232128
Culture: NO GROWTH

## 2010-09-15 ENCOUNTER — Encounter: Payer: Self-pay | Admitting: Internal Medicine

## 2010-09-15 ENCOUNTER — Ambulatory Visit (INDEPENDENT_AMBULATORY_CARE_PROVIDER_SITE_OTHER)
Admission: RE | Admit: 2010-09-15 | Discharge: 2010-09-15 | Disposition: A | Discharge status: Home / Self Care | Payer: PRIVATE HEALTH INSURANCE | Source: Ambulatory Visit | Attending: Internal Medicine | Admitting: Internal Medicine

## 2010-09-15 ENCOUNTER — Ambulatory Visit (INDEPENDENT_AMBULATORY_CARE_PROVIDER_SITE_OTHER): Payer: No Typology Code available for payment source | Admitting: Internal Medicine

## 2010-09-15 VITALS — BP 128/80 | HR 65 | Temp 98.7°F | Ht 68.0 in

## 2010-09-15 DIAGNOSIS — K3189 Other diseases of stomach and duodenum: Secondary | ICD-10-CM

## 2010-09-15 DIAGNOSIS — E041 Nontoxic single thyroid nodule: Secondary | ICD-10-CM

## 2010-09-15 DIAGNOSIS — K219 Gastro-esophageal reflux disease without esophagitis: Secondary | ICD-10-CM

## 2010-09-15 DIAGNOSIS — R928 Other abnormal and inconclusive findings on diagnostic imaging of breast: Secondary | ICD-10-CM

## 2010-09-15 DIAGNOSIS — M79672 Pain in left foot: Secondary | ICD-10-CM

## 2010-09-15 DIAGNOSIS — M79609 Pain in unspecified limb: Secondary | ICD-10-CM

## 2010-09-15 DIAGNOSIS — I10 Essential (primary) hypertension: Secondary | ICD-10-CM

## 2010-09-15 DIAGNOSIS — N6019 Diffuse cystic mastopathy of unspecified breast: Secondary | ICD-10-CM

## 2010-09-15 MED ORDER — PANTOPRAZOLE SODIUM 20 MG PO TBEC: 20.0000 mg | DELAYED_RELEASE_TABLET | Freq: Every day | ORAL | Status: DC

## 2010-09-15 MED ORDER — TRAMADOL HCL 50 MG PO TABS: 50.0000 mg | ORAL_TABLET | Freq: Four times a day (QID) | ORAL | Status: DC | PRN

## 2010-09-15 MED ORDER — POTASSIUM CHLORIDE CRYS ER 20 MEQ PO TBCR: 20.0000 meq | EXTENDED_RELEASE_TABLET | Freq: Every day | ORAL | Status: DC

## 2010-09-15 MED ORDER — OMEPRAZOLE 40 MG PO CPDR: 40.0000 mg | DELAYED_RELEASE_CAPSULE | Freq: Every day | ORAL | Status: DC

## 2010-09-15 NOTE — Assessment & Plan Note (Signed)
I will schedule a f/up U/S to monitor the nodule

## 2010-09-15 NOTE — Patient Instructions (Signed)
Esophagitis (Heartburn) Esophagitis (heartburn) is a painful, burning sensation in the chest. It may feel worse in certain positions, such as lying down or bending over. It is caused by stomach acid backing up into the tube that carries food from the mouth down to the stomach (lower esophagus). TREATMENT There are a number of non-prescription medicines used to treat heartburn, including:  Antacids.   Acid reducers (also called H-2 blockers).   Proton-pump inhibitors.  HOME CARE INSTRUCTIONS  Raise the head of your bead by putting blocks under the legs.   Eat 2-3 hours before going to bed.   Stop smoking.   Try to reach and maintain a healthy weight.   Do not eat just a few very large meals. Instead, eat many smaller meals throughout the day.   Try to identify foods and beverages that make your symptoms worse, and avoid these.   Avoid tight clothing.   Do not exercise right after eating.  SEEK IMMEDIATE MEDICAL CARE IF YOU:  Have severe chest pain that goes down your arm, or into your jaw or neck.   Feel sweaty, dizzy, or lightheaded.   Are short of breath.   Throw up (vomit) blood.   Have difficulty or pain with swallowing.   Have bloody or black, tarry stools.   Have bouts of heartburn more than three times a week for more than two weeks.  Document Released: 07/16/2004 Document Re-Released: 09/02/2009 [employer] Patient Information 2011 [employer]. 

## 2010-09-15 NOTE — Assessment & Plan Note (Signed)
This is doing well 

## 2010-09-15 NOTE — Assessment & Plan Note (Signed)
Will change PPI to omeprazole at her request

## 2010-09-15 NOTE — Progress Notes (Signed)
Subjective:     [name] is an 75 y.o. female who presents for evaluation of heartburn. This has been associated with belching, chest pain and heartburn. She denies abdominal bloating, choking on food, cough, deep pressure at base of neck, difficulty swallowing, dysphagia, early satiety and hematemesis. Symptoms have been present for 3 months. She denies dysphagia. She has not lost weight. She denies melena, hematochezia, hematemesis, and coffee ground emesis. Medical therapy in the past has included: proton pump inhibitors.  The following portions of the patient's history were reviewed and updated as appropriate: allergies, current medications, past family history, past medical history, past social history, past surgical history and problem list.  She also complains of a 2 week history of left foot pain, there was no trauma or injury. She will not take Ibuprofen or tylenol but she does request something for pain.  Review of Systems Constitutional: positive for malaise, negative for anorexia, chills, fatigue, fevers, night sweats, sweats and weight loss Gastrointestinal: positive for dyspepsia and reflux symptoms   Objective:     BP 128/80  Pulse 65  Temp(Src) 98.7 F (37.1 C) (Oral)  Ht 5\' 8"  (1.727 m)  SpO2 97%  General Appearance:    Alert, cooperative, no distress, appears stated age  Head:    Normocephalic, without obvious abnormality, atraumatic  Eyes:    PERRL, conjunctiva/corneas clear, EOM's intact, fundi    benign, both eyes  Ears:    Normal TM's and external ear canals, both ears  Nose:   Nares normal, septum midline, mucosa normal, no drainage    or sinus tenderness  Throat:   Lips, mucosa, and tongue normal; teeth and gums normal  Neck:   Supple, symmetrical, trachea midline, no adenopathy;    thyroid:  no enlargement/tenderness/nodules; no carotid   bruit or JVD  Back:     Symmetric, no curvature, ROM normal, no CVA tenderness  Lungs:     Clear to auscultation  bilaterally, respirations unlabored  Chest Wall:    No tenderness or deformity   Heart:    Regular rate and rhythm, S1 and S2 normal, no murmur, rub   or gallop  Breast Exam:    No tenderness, masses, or nipple abnormality  Abdomen:     Soft, non-tender, bowel sounds active all four quadrants,    no masses, no organomegaly        Extremities:   Extremities normal, atraumatic, no cyanosis or edema  Pulses:   2+ and symmetric all extremities  Skin:   Skin color, texture, turgor normal, no rashes or lesions  Lymph nodes:   Cervical, supraclavicular, and axillary nodes normal  Neurologic:   CNII-XII intact, normal strength, sensation and reflexes    throughout     Assessment:    Gastroesophageal Reflux Disease,      Plan:    Will start a trial of proton pump inhibitors. Follow up in 3 months or sooner as needed.

## 2010-09-15 NOTE — Assessment & Plan Note (Signed)
Will check an xray of the foot to look for djd, fracture, etc

## 2010-09-15 NOTE — Assessment & Plan Note (Signed)
Schedule f/up mammogram

## 2010-09-16 ENCOUNTER — Telehealth: Payer: Self-pay

## 2010-09-16 DIAGNOSIS — N63 Unspecified lump in unspecified breast: Secondary | ICD-10-CM

## 2010-09-16 NOTE — Telephone Encounter (Signed)
ok 

## 2010-09-16 NOTE — Progress Notes (Signed)
Addended by: Rock Nephew on: 09/16/2010 12:16 PM   Modules accepted: Orders

## 2010-09-16 NOTE — Telephone Encounter (Signed)
[name]  W/ [facility] called x two stating that dx used on the previous mammogram order is not  covered through the pt insurance. She states that is must be associated with a direct problem ex: breast lump at 2:00  Etc. I tried to correct this the first time but it was not acceptable. Please help Thanks

## 2010-09-17 NOTE — Progress Notes (Signed)
Addended by: Lamar Sprinkles on: 09/17/2010 09:12 AM   Modules accepted: Orders

## 2010-09-18 ENCOUNTER — Other Ambulatory Visit: Payer: Self-pay

## 2010-09-18 ENCOUNTER — Other Ambulatory Visit: Payer: Self-pay | Admitting: Internal Medicine

## 2010-09-18 ENCOUNTER — Telehealth: Payer: Self-pay

## 2010-09-18 DIAGNOSIS — N6320 Unspecified lump in the left breast, unspecified quadrant: Secondary | ICD-10-CM

## 2010-09-18 NOTE — Telephone Encounter (Signed)
Gena called stating that an order was placed for a bilateral diagnostic mammogram but pt was having LT breast problems. Gena stated that she re-ordered LT diagnostic mammogram but needs bilateral order to be cancelled. Bilateral order has been cancelled.

## 2010-09-22 ENCOUNTER — Other Ambulatory Visit: Payer: No Typology Code available for payment source

## 2010-09-23 ENCOUNTER — Other Ambulatory Visit: Payer: No Typology Code available for payment source

## 2010-09-25 ENCOUNTER — Other Ambulatory Visit: Payer: No Typology Code available for payment source

## 2010-09-29 ENCOUNTER — Ambulatory Visit
Admission: RE | Admit: 2010-09-29 | Discharge: 2010-09-29 | Disposition: A | Discharge status: Home / Self Care | Payer: No Typology Code available for payment source | Source: Ambulatory Visit | Attending: Internal Medicine | Admitting: Internal Medicine

## 2010-09-29 ENCOUNTER — Other Ambulatory Visit: Payer: No Typology Code available for payment source

## 2010-09-29 ENCOUNTER — Other Ambulatory Visit: Payer: Self-pay | Admitting: Internal Medicine

## 2010-09-29 DIAGNOSIS — N6320 Unspecified lump in the left breast, unspecified quadrant: Secondary | ICD-10-CM

## 2010-09-29 DIAGNOSIS — Z1231 Encounter for screening mammogram for malignant neoplasm of breast: Secondary | ICD-10-CM

## 2010-10-02 ENCOUNTER — Encounter: Payer: Self-pay | Admitting: Internal Medicine

## 2010-10-02 ENCOUNTER — Ambulatory Visit (INDEPENDENT_AMBULATORY_CARE_PROVIDER_SITE_OTHER): Payer: No Typology Code available for payment source | Admitting: Internal Medicine

## 2010-10-02 VITALS — BP 104/62 | HR 79 | Temp 98.8°F

## 2010-10-02 DIAGNOSIS — K0510 Chronic gingivitis, plaque induced: Secondary | ICD-10-CM

## 2010-10-02 DIAGNOSIS — K121 Other forms of stomatitis: Secondary | ICD-10-CM

## 2010-10-02 DIAGNOSIS — K219 Gastro-esophageal reflux disease without esophagitis: Secondary | ICD-10-CM

## 2010-10-02 DIAGNOSIS — K1230 Oral mucositis (ulcerative), unspecified: Secondary | ICD-10-CM

## 2010-10-02 DIAGNOSIS — I10 Essential (primary) hypertension: Secondary | ICD-10-CM

## 2010-10-02 MED ORDER — DIPHENHYD-HYDROCORT-NYSTATIN MT SUSP: 5.0000 mL | Freq: Four times a day (QID) | OROMUCOSAL | Status: DC | PRN

## 2010-10-02 MED ORDER — NEBIVOLOL HCL 10 MG PO TABS: 10.0000 mg | ORAL_TABLET | Freq: Two times a day (BID) | ORAL | Status: DC

## 2010-10-02 NOTE — Patient Instructions (Signed)
Stomatitis Stomatitis is an inflammation of the mucous lining of the mouth. It can affect either a part or the whole mouth. The intensity of symptoms can range from mild to severe. It can affect your cheek, teeth, gums, lips, or tongue. In almost all cases, the lining of the mouth becomes swollen, red and painful. Painful ulcers can develop in your mouth. There are different types of stomatitis. It can occur due to infection, allergy, improper denture or sharp teeth. Stomatitis recurs in some people. CAUSES There are many common causes of stomatitis. They include:  Viruses (such as cold sores or shingles).  Canker sores. The cause is unknown.   Bacteria (such as ulcerative gingivitis or sexually transmitted diseases).   Fungus or yeast (such as candidiasis or oral thrush).   Poor oral hygiene and poor nutrition (Vincent's stomatitis or trench mouth).   Lack of vitamin B, C, or niacin.   Dentures or braces that do not fit properly.   High acid foods (uncommon).   Sharp or broken teeth.  Cheek biting.   Breathing through the mouth.   Chewing tobacco.   Allergy to toothpaste, mouthwash, candy, gum, lipstick, or some medicines.   Burning your mouth with hot drinks or food.   Occupational exposure to dyes, heavy metals, acid fumes, or mineral dust.   SYMPTOMS The symptoms of stomatitis may include:  Painful ulcers in the mouth.  Blisters in the mouth.   Bleeding gums.   Swollen gums.   Irritability.   Bad breath.  Bad taste.   Fever.   Trouble with eating because of burning and pain in the mouth.   DIAGNOSIS Your caregiver will examine your mouth. He will look for bleeding gums and mouth ulcers. He may ask you about the medicines you are taking. Your caregiver may suggest blood test and biopsy (removing body tissue for testing) of the mouth ulcer or mass if either is present. This will help him to find out the cause of your condition. TREATMENT Your caregiver will  treat you depending on the cause. He will first try to treat your symptoms. You may be given pain-killers to relieve your pain. Topical anesthetic (ointment that numbs the sensation of the area on which it is applied) may have to be tried, if you have severe pain. Your caregiver may give medications which kill germs (antibiotics), if you have bacterial infection. Your caregiver may give you antifungals (drugs that are used to treat fungal infection) if you have fungal infection. You may have to take medicines that kill viruses or prevent their growth (antivirals), if you have a viral infection like herpes. You may be asked to use medicated mouth rinses. Your caregiver will advise you about proper brushing and using a soft brush. You may have to get your teeth cleaned regularly.  HOME CARE INSTRUCTIONS  Maintain good oral hygiene. This is especially true for transplant patients.   Brush your teeth carefully with a soft, nylon-bristled toothbrush.   Floss at least two times a day.   Clean your mouth after eating.   Rinse your mouth with bland saline solution 3-4 times a day.   Gargle with cold water.   Use topical numbing medicines to decrease pain if recommended by your caregiver.   Stop smoking and/or using chewing or smokeless tobacco.   Avoid eating hot and spicy foods.   Eat soft and bland food.   Reduce your stress wherever possible.   Eat healthy and nutritious food.  SEEK MEDICAL CARE  IF:  Symptoms persist or become worse.   You develop new symptoms.   Mouth ulcers are present for more than 3 weeks.   Mouth ulcers recur frequently.   You develop increasing difficulty with normal eating and drinking.   You develop a fever of 100.5 or higher.   You have increasing fatigue or weakness.   You develop loss of appetite or feel sick to your stomach (nausea).  SEEK IMMEDIATE MEDICAL CARE IF:  You have an oral temperature above 100.5.   You develop pain, redness or sores  around one or both eyes.   You can not eat or drink because of pain or other symptoms.   You develop worsening weakness or faint.   You develop vomiting or diarrhea.   You develop chest pain, shortness of breath, or rapid irregular heart beats.  REFERENCES  [employer] [facility] Document Released: 04/05/2007 Document Re-Released: 05/27/2009 [facility] Patient Information 2011 [place], [place].

## 2010-10-03 ENCOUNTER — Encounter: Payer: Self-pay | Admitting: Internal Medicine

## 2010-10-03 DIAGNOSIS — K1230 Oral mucositis (ulcerative), unspecified: Secondary | ICD-10-CM | POA: Insufficient documentation

## 2010-10-03 DIAGNOSIS — K121 Other forms of stomatitis: Secondary | ICD-10-CM | POA: Insufficient documentation

## 2010-10-03 DIAGNOSIS — K0510 Chronic gingivitis, plaque induced: Secondary | ICD-10-CM | POA: Insufficient documentation

## 2010-10-03 NOTE — Assessment & Plan Note (Signed)
I think this is viral so will try magic mouthwash to decrease the inflammation and pain

## 2010-10-03 NOTE — Assessment & Plan Note (Signed)
Dental referral sent 

## 2010-10-03 NOTE — Assessment & Plan Note (Signed)
Continue PPI therapy. 

## 2010-10-03 NOTE — Progress Notes (Signed)
  Subjective:    Patient ID: [name], female    DOB: 04-01-1936, 75 y.o.   MRN: [id number]  HPI She returns c/o mouth pain for 4 days and tells me that her dentures have felt irritated lately. She feels like she needs to see a dentist.    Review of Systems  Constitutional: Negative for fever, chills, diaphoresis, activity change, appetite change, fatigue and unexpected weight change.  HENT: Positive for mouth sores and dental problem. Negative for hearing loss, ear pain, congestion, sore throat, facial swelling, drooling, trouble swallowing, neck pain, neck stiffness, voice change and sinus pressure.   Respiratory: Negative for apnea, cough, choking, chest tightness, shortness of breath, wheezing and stridor.   Cardiovascular: Negative for chest pain, palpitations and leg swelling.  Gastrointestinal: Negative for nausea, vomiting, abdominal pain, diarrhea, constipation and blood in stool.  Musculoskeletal: Negative for back pain, joint swelling, arthralgias and gait problem.  Skin: Negative for color change, pallor and rash.  Neurological: Negative for dizziness, tremors, seizures, facial asymmetry, speech difficulty, weakness, numbness and headaches.  Hematological: Negative for adenopathy. Does not bruise/bleed easily.  Psychiatric/Behavioral: Negative for behavioral problems, confusion and agitation. The patient is not nervous/anxious.        Objective:   Physical Exam  Constitutional: She is oriented to person, place, and time. She appears well-developed and well-nourished. No distress.  HENT:  Head: Normocephalic and atraumatic. No trismus in the jaw.  Nose: Nose normal.  Mouth/Throat: Mucous membranes are not pale, not dry and not cyanotic. She has dentures. Oral lesions (she has an aphthous ulcer on the left soft palate) present. Abnormal dentition (she has many teeth missing and the gingiva under her dentures is inflammed but there is no exudate or abscess and the floor of  the mouth is not elevated). No dental abscesses, uvula swelling, lacerations or dental caries. No oropharyngeal exudate, posterior oropharyngeal edema, posterior oropharyngeal erythema or tonsillar abscesses.  Eyes: Conjunctivae and EOM are normal. Pupils are equal, round, and reactive to light. Right eye exhibits no discharge. Left eye exhibits no discharge. No scleral icterus.  Neck: Normal range of motion. Neck supple. No thyromegaly present.  Cardiovascular: Normal rate, regular rhythm, normal heart sounds and intact distal pulses.  Exam reveals no gallop and no friction rub.   No murmur heard. Pulmonary/Chest: Effort normal and breath sounds normal. No stridor. No respiratory distress. She has no wheezes. She has no rales. She exhibits no tenderness.  Abdominal: Soft. Bowel sounds are normal. She exhibits no distension and no mass. There is no tenderness. There is no rebound and no guarding.  Musculoskeletal: Normal range of motion. She exhibits no edema and no tenderness.  Lymphadenopathy:    She has no cervical adenopathy.  Neurological: She is alert and oriented to person, place, and time. She has normal reflexes.  Skin: Skin is warm and dry. No rash noted. She is not diaphoretic. No erythema. No pallor.  Psychiatric: She has a normal mood and affect. Her behavior is normal. Judgment and thought content normal.          Assessment & Plan:

## 2010-10-03 NOTE — Assessment & Plan Note (Signed)
Her BP is well controlled 

## 2010-10-06 ENCOUNTER — Other Ambulatory Visit: Payer: No Typology Code available for payment source

## 2010-10-07 ENCOUNTER — Ambulatory Visit
Admission: RE | Admit: 2010-10-07 | Discharge: 2010-10-07 | Disposition: A | Discharge status: Home / Self Care | Payer: No Typology Code available for payment source | Source: Ambulatory Visit | Attending: Internal Medicine | Admitting: Internal Medicine

## 2010-10-07 DIAGNOSIS — E041 Nontoxic single thyroid nodule: Secondary | ICD-10-CM

## 2010-11-04 NOTE — Assessment & Plan Note (Signed)
[facility]                            CARDIOLOGY OFFICE NOTE   [name], [name]                       MRN:          [id number]  DATE:07/09/2008                            DOB:          1935/07/05    IDENTIFICATION:  [name] is a 75 year old woman who is self-referred  for evaluation of palpitations.   HISTORY OF PRESENT ILLNESS:  The patient appears to have had a  longstanding history of palpitations.  She had a treadmill test in  [place] 3 years ago.  About a year ago, she was seen by Dr. Julieanne Manson at [facility], underwent cardiac catheterization,  echocardiogram, 24-hour Holter monitor.  I do not have the results of  these   On talking to the patient, she complains of fluttering that occurs about  2 times per day and lasts minutes to all day.  Denies any associated  dizziness.  She says she is short of breath with and without activity,  episodes again with fluttering occur with some mild activity.   She also complains of some tingling in her right arm, couple weeks ago  left fingers have been numb intermittently.   Otherwise, she denies chest pain.   CURRENT MEDICINES:  1. Aspirin 81.  2. Atenolol 25 b.i.d.  3. Simvastatin 20.  4. Dyazide question dose.  5. AREDS eye vitamin.   PAST MEDICAL HISTORY:  1. Question dyslipidemia.  2. Palpitations.  3. History of postmenopausal bleeding, is not followed by Dr. Henderson Cloud,      but did not pursue full workup.  4. Question hypertension.   ALLERGIES:  MOTRIN.   SOCIAL HISTORY:  The patient does not smoke, does not drink.  Is a  retired Agricultural engineer.   FAMILY HISTORY:  Negative for CAD.   REVIEW OF SYSTEMS:  The patient being evaluated for thyroid issue by Dr.  Haroldine Laws, no records.   PHYSICAL EXAMINATION:  GENERAL:  On exam, the patient is in no distress.  VITAL SIGNS:  Blood pressure 126/82, pulse is 81 and regular, weight is  171.  HEENT:  Normocephalic,  atraumatic.  EOMI.  PERRL.  Mucous membranes  moist.  NECK:  Thyroid slightly prominent.  No definite nodules.  No bruits.  No  JVD.  LUNGS:  Clear to auscultation without rales or wheezes.  CARDIAC:  Regular rate and rhythm, S1 and S2, no S3 or S4, or  significant murmurs.  ABDOMEN:  Supple, nontender.  Normal bowel sounds.  No hepatomegaly.  EXTREMITIES:  Good distal pulses throughout.  No lower extremity edema.   A 12-lead EKG, normal sinus rhythm at 71 beats per minute.  PR interval  is 212 milliseconds with first-degree AV block.  Nonspecific T-wave  changes with T-wave inversion in V1 and V2.  No old EKGs to compare.   IMPRESSION:  [name] is a 75 year old woman who has a longstanding  history of palpitations, they do not sound hemodynamically  destabilizing.  It looks like she has had an extensive workup a year  ago.  I will need to get the records on this.  We will have her sign a  release of information today.   Unfortunately, the day she wore the Holter monitor, she did not have any  palpitations.  I will therefore set her up for an event monitor.  I will  be in touch with her once I have seen the test results.  Continue on  current regimen.     Pricilla Riffle, MD, [facility]  Electronically Signed    PVR/MedQ  DD: 07/09/2008  DT: 07/10/2008  Job #: [id number]   cc:   Bertram Millard. Hyacinth Meeker, M.D.

## 2010-11-04 NOTE — H&P (Signed)
NAME:  [name], [name] NO.:  [id number]   MEDICAL RECORD NO.:  [id number]         PATIENT TYPE:  WAMB   LOCATION:                                FACILITY:  [facility]   PHYSICIAN:  Guy Sandifer. Henderson Cloud, M.D. DATE OF BIRTH:  1935/07/05   DATE OF ADMISSION:  12/26/2007  DATE OF DISCHARGE:                              HISTORY & PHYSICAL   CHIEF COMPLAINT:  Postmenopausal bleeding.   HISTORY OF PRESENT ILLNESS:  This patient is a 75 year old divorced  black female G2, P2 with the last menstrual period age around 89.  In  2007, she had some postmenopausal bleeding, which apparently was not  evaluated.  She more recently developed some light brown discharge that  was suspicious for old blood.  Subsequent ultrasound revealed the uterus  measuring 7.7 x 3.7 x 5.3 cm.  Endometrial stripe is 0.63 cm with some  cystic areas in the endometrial stripe.  The patient is being admitted  for hysteroscopy with dilatation and curettage.   PAST MEDICAL HISTORY:  1. Chronic hypertension.  2. Arthritis.   PAST SURGICAL HISTORY:  Knee replacement 9 years ago.   OBSTETRICAL HISTORY:  Vaginal delivery x2.   MEDICATIONS:  1. Atenolol 50 mg twice a day.  2. Simvastatin 20 mg.  3. Baby aspirin once a day.  4. The patient does take Aleve p.r.n. for arthritis with no troubles.   ALLERGIES:  Motrin leading to blurred vision.   FAMILY HISTORY:  Not contributory.   SOCIAL HISTORY:  Denies tobacco, alcohol, or drug abuse.   PHYSICAL EXAMINATION:  VITAL SIGNS:  Height 5 feet 5 inches, weight  172.6 pounds, and blood pressure 126/80.  HEENT:  Without thyromegaly.  LUNGS:  Clear to auscultation.  HEART:  Regular rate and rhythm.  BACK:  Without CVA tenderness.  ABDOMEN:  Soft and nontender without palpable masses.  PELVIC:  Cervix without lesion.  Vagina is atrophic.  Pelvic exam is  limited secondary to voluntary patient guarding.  No masses are  palpated.  EXTREMITIES:  Grossly within  normal limits.  NEUROLOGIC:  Grossly within normal limits.   ASSESSMENT:  Postmenopausal bleeding.   PLAN:  Hysteroscopy, D and C.      James E. Henderson Cloud, M.D.  Electronically Signed     JET/MEDQ  D:  12/22/2007  T:  12/23/2007  Job:  [id number]

## 2010-11-13 ENCOUNTER — Other Ambulatory Visit (INDEPENDENT_AMBULATORY_CARE_PROVIDER_SITE_OTHER): Payer: No Typology Code available for payment source

## 2010-11-13 ENCOUNTER — Encounter: Payer: Self-pay | Admitting: Endocrinology

## 2010-11-13 ENCOUNTER — Ambulatory Visit (INDEPENDENT_AMBULATORY_CARE_PROVIDER_SITE_OTHER): Payer: No Typology Code available for payment source | Admitting: Endocrinology

## 2010-11-13 VITALS — BP 128/82 | HR 68 | Temp 98.0°F | Ht 66.0 in | Wt 164.0 lb

## 2010-11-13 DIAGNOSIS — R109 Unspecified abdominal pain: Secondary | ICD-10-CM

## 2010-11-13 DIAGNOSIS — D649 Anemia, unspecified: Secondary | ICD-10-CM

## 2010-11-13 DIAGNOSIS — IMO0001 Reserved for inherently not codable concepts without codable children: Secondary | ICD-10-CM

## 2010-11-13 DIAGNOSIS — Z79899 Other long term (current) drug therapy: Secondary | ICD-10-CM

## 2010-11-13 DIAGNOSIS — R252 Cramp and spasm: Secondary | ICD-10-CM

## 2010-11-13 LAB — IBC PANEL
Iron: 88 ug/dL (ref 42–145)
Saturation Ratios: 28.4 % (ref 20.0–50.0)
Transferrin: 221.7 mg/dL (ref 212.0–360.0)

## 2010-11-13 LAB — HEPATIC FUNCTION PANEL
ALT: 15 U/L (ref 0–35)
AST: 20 U/L (ref 0–37)
Albumin: 3.9 g/dL (ref 3.5–5.2)
Alkaline Phosphatase: 65 U/L (ref 39–117)
Bilirubin, Direct: 0.1 mg/dL (ref 0.0–0.3)
Total Bilirubin: 0.6 mg/dL (ref 0.3–1.2)
Total Protein: 7.3 g/dL (ref 6.0–8.3)

## 2010-11-13 LAB — CBC WITH DIFFERENTIAL/PLATELET
Basophils Absolute: 0 10*3/uL (ref 0.0–0.1)
Basophils Relative: 0.5 % (ref 0.0–3.0)
Eosinophils Absolute: 0.1 10*3/uL (ref 0.0–0.7)
Eosinophils Relative: 2.9 % (ref 0.0–5.0)
HCT: 34.7 % — ABNORMAL LOW (ref 36.0–46.0)
Hemoglobin: 11.9 g/dL — ABNORMAL LOW (ref 12.0–15.0)
Lymphocytes Relative: 39.3 % (ref 12.0–46.0)
Lymphs Abs: 2 10*3/uL (ref 0.7–4.0)
MCHC: 34.3 g/dL (ref 30.0–36.0)
MCV: 99 fl (ref 78.0–100.0)
Monocytes Absolute: 0.4 10*3/uL (ref 0.1–1.0)
Monocytes Relative: 8.7 % (ref 3.0–12.0)
Neutro Abs: 2.4 10*3/uL (ref 1.4–7.7)
Neutrophils Relative %: 48.6 % (ref 43.0–77.0)
Platelets: 184 10*3/uL (ref 150.0–400.0)
RBC: 3.5 Mil/uL — ABNORMAL LOW (ref 3.87–5.11)
RDW: 13.2 % (ref 11.5–14.6)
WBC: 5 10*3/uL (ref 4.5–10.5)

## 2010-11-13 LAB — VITAMIN B12: Vitamin B-12: 1434 pg/mL — ABNORMAL HIGH (ref 211–911)

## 2010-11-13 LAB — BASIC METABOLIC PANEL
BUN: 10 mg/dL (ref 6–23)
CO2: 29 mEq/L (ref 19–32)
Calcium: 9.6 mg/dL (ref 8.4–10.5)
Chloride: 99 mEq/L (ref 96–112)
Creatinine, Ser: 0.8 mg/dL (ref 0.4–1.2)
GFR: 87.94 mL/min (ref >60.00)
Glucose, Bld: 92 mg/dL (ref 70–99)
Potassium: 4.2 mEq/L (ref 3.5–5.1)
Sodium: 135 mEq/L (ref 135–145)

## 2010-11-13 LAB — AMYLASE: Amylase: 120 U/L (ref 27–131)

## 2010-11-13 MED ORDER — ONDANSETRON HCL 4 MG PO TABS: 4.0000 mg | ORAL_TABLET | Freq: Three times a day (TID) | ORAL | Status: AC | PRN

## 2010-11-13 NOTE — Progress Notes (Signed)
Subjective:    Patient ID: [name], female    DOB: [birth year], 75 y.o.   MRN: [id number]  HPI 1 week of mild crampy-quality pain at the abdomen, and assoc nausea. She takes prilosec as rx'ed.   She says the current k+ pill does not help her muscle camps.   Past Medical History  Diagnosis Date  . Palpitations   . HTN (hypertension)   . Macular degeneration (senile) of retina, unspecified   . Nontoxic uninodular goiter   . Diffuse cystic mastopathy   . Anxiety   . Hyperlipidemia     Past Surgical History  Procedure Date  . Total knee arthroplasty     History   Social History  . Marital Status: Divorced    Spouse Name: N/A    Number of Children: N/A  . Years of Education: N/A   Occupational History  . Not on file.   Social History Main Topics  . Smoking status: Never Smoker   . Smokeless tobacco: Not on file  . Alcohol Use: No  . Drug Use: No  . Sexually Active: Not on file   Other Topics Concern  . Not on file   Social History Narrative   Regular Exercise -  NO    Current Outpatient Prescriptions on File Prior to Visit  Medication Sig Dispense Refill  . aspirin 81 MG EC tablet Take 81 mg by mouth daily.        . clonazePAM (KLONOPIN) 0.5 MG tablet Take 0.5 mg by mouth 2 (two) times daily as needed. For anxiety        . Multiple Vitamins-Minerals (MULTIVITAMIN,TX-MINERALS) tablet Take 1 tablet by mouth daily.        . nebivolol (BYSTOLIC) 10 MG tablet Take 1 tablet (10 mg total) by mouth 2 (two) times daily.  60 tablet  11  . omeprazole (PRILOSEC) 40 MG capsule Take 1 capsule (40 mg total) by mouth daily.  30 capsule  1  . potassium chloride SA (K-DUR,KLOR-CON) 20 MEQ tablet Take 1 tablet (20 mEq total) by mouth daily.  30 tablet  11  . triamterene-hydrochlorothiazide (DYAZIDE) 37.5-25 MG per capsule Take 1 capsule by mouth every morning.          Allergies  Allergen Reactions  . Ibuprofen     REACTION: Blurry vision  . Rosuvastatin     REACTION:  muscle aches    Family History  Problem Relation Age of Onset  . Colon cancer Neg Hx   . Coronary artery disease Other     BP 128/82  Pulse 68  Temp(Src) 98 F (36.7 C) (Oral)  Ht 5\' 6"  (1.676 m)  Wt 164 lb (74.39 kg)  BMI 26.47 kg/m2  SpO2 97%    Review of Systems Denies brbpr and fever.      Objective:   Physical Exam GENERAL: no distress ABDOMEN: abdomen is soft, nontender.  no hepatosplenomegaly.   not distended.  no hernia    Lab Results  Component Value Date   WBC 5.0 11/13/2010   HGB 11.9* 11/13/2010   HCT 34.7* 11/13/2010   PLT 184.0 11/13/2010   CHOL 227* 05/30/2010   TRIG 119.0 05/30/2010   HDL 57.10 05/30/2010   LDLDIRECT 134.0 05/30/2010   ALT 15 11/13/2010   AST 20 11/13/2010   NA 135 11/13/2010   K 4.2 11/13/2010   CL 99 11/13/2010   CREATININE 0.8 11/13/2010   BUN 10 11/13/2010   CO2 29 11/13/2010   TSH  0.40 05/30/2010   INR 1.0 ratio 05/27/2009   HGBA1C  Value: 5.7 (NOTE)                                                                       According to the ADA Clinical Practice Recommendations for 2011, when HbA1c is used as a screening test:   >=6.5%   Diagnostic of Diabetes Mellitus           (if abnormal result  is confirmed)  5.7-6.4%   Increased risk of developing Diabetes Mellitus  References:Diagnosis and Classification of Diabetes Mellitus,Diabetes Care,2011,34(Suppl 1):S62-S69 and Standards of Medical Care in         Diabetes - 2011,Diabetes Care,2011,34  (Suppl 1):S11-S61.* 03/18/2010      Assessment & Plan:  Gi sxs, uncertain etiology Muscle cramps, not due to electrolyte prob Chronic stable anemia

## 2010-11-13 NOTE — Patient Instructions (Addendum)
i have requested for you an follow-up appointment with dr Christella Hartigan.  you will be called with a day and time for an appointment. blood tests are being ordered for you today.  please call [phone] to hear your test results.  You will be prompted to enter the 9-digit "MRN" number that appears at the top left of this page, followed by #.  Then you will hear the message. Here are some samples of "dexilant," to take 1 daily for now, to see if they help your symptoms any more than the omeprazole.  I hope you feel better soon.  If you don't feel better by next week, please call your doctor. i have sent a prescription to your pharmacy, to take as needed for nausea. (update: i left message on phone-tree:  We'll follow anemia).

## 2010-11-14 LAB — PTH, INTACT AND CALCIUM
Calcium, Total (PTH): 10 mg/dL (ref 8.4–10.5)
PTH: 20.1 pg/mL (ref 14.0–72.0)

## 2010-11-18 ENCOUNTER — Telehealth: Payer: Self-pay | Admitting: *Deleted

## 2010-11-18 NOTE — Telephone Encounter (Signed)
PA requested for Ondansetron 4mg  tablet Called and requested PA form from [facility] 11/14/2010  Recvd form, forwarded to MD for Completion [11/18/10--Holiday weekend]

## 2010-11-19 ENCOUNTER — Ambulatory Visit: Payer: PRIVATE HEALTH INSURANCE | Admitting: Internal Medicine

## 2010-11-20 NOTE — Telephone Encounter (Signed)
Pt states that samples she received from MD at [facility] helped with her nausea and that she does not want to fill Zofran at this time.

## 2010-12-02 ENCOUNTER — Ambulatory Visit: Payer: No Typology Code available for payment source

## 2010-12-04 ENCOUNTER — Encounter: Payer: Self-pay | Admitting: Internal Medicine

## 2010-12-04 ENCOUNTER — Other Ambulatory Visit: Payer: Self-pay | Admitting: Internal Medicine

## 2010-12-04 ENCOUNTER — Other Ambulatory Visit (INDEPENDENT_AMBULATORY_CARE_PROVIDER_SITE_OTHER): Payer: No Typology Code available for payment source

## 2010-12-04 ENCOUNTER — Ambulatory Visit (INDEPENDENT_AMBULATORY_CARE_PROVIDER_SITE_OTHER): Payer: No Typology Code available for payment source | Admitting: Internal Medicine

## 2010-12-04 DIAGNOSIS — M25579 Pain in unspecified ankle and joints of unspecified foot: Secondary | ICD-10-CM

## 2010-12-04 DIAGNOSIS — I471 Supraventricular tachycardia: Secondary | ICD-10-CM

## 2010-12-04 DIAGNOSIS — G729 Myopathy, unspecified: Secondary | ICD-10-CM

## 2010-12-04 DIAGNOSIS — M25572 Pain in left ankle and joints of left foot: Secondary | ICD-10-CM | POA: Insufficient documentation

## 2010-12-04 DIAGNOSIS — E041 Nontoxic single thyroid nodule: Secondary | ICD-10-CM

## 2010-12-04 DIAGNOSIS — E785 Hyperlipidemia, unspecified: Secondary | ICD-10-CM

## 2010-12-04 DIAGNOSIS — D649 Anemia, unspecified: Secondary | ICD-10-CM

## 2010-12-04 DIAGNOSIS — I10 Essential (primary) hypertension: Secondary | ICD-10-CM

## 2010-12-04 DIAGNOSIS — F411 Generalized anxiety disorder: Secondary | ICD-10-CM

## 2010-12-04 LAB — LIPID PANEL
Cholesterol: 219 mg/dL — ABNORMAL HIGH (ref 0–200)
HDL: 62.3 mg/dL (ref >39.00)
Total CHOL/HDL Ratio: 4
Triglycerides: 134 mg/dL (ref 0.0–149.0)
VLDL: 26.8 mg/dL (ref 0.0–40.0)

## 2010-12-04 LAB — COMPREHENSIVE METABOLIC PANEL
ALT: 13 U/L (ref 0–35)
AST: 21 U/L (ref 0–37)
Albumin: 4.2 g/dL (ref 3.5–5.2)
Alkaline Phosphatase: 68 U/L (ref 39–117)
BUN: 12 mg/dL (ref 6–23)
CO2: 28 mEq/L (ref 19–32)
Calcium: 8.8 mg/dL (ref 8.4–10.5)
Chloride: 105 mEq/L (ref 96–112)
Creatinine, Ser: 0.8 mg/dL (ref 0.4–1.2)
GFR: 89.18 mL/min (ref >60.00)
Glucose, Bld: 93 mg/dL (ref 70–99)
Potassium: 3.6 mEq/L (ref 3.5–5.1)
Sodium: 139 mEq/L (ref 135–145)
Total Bilirubin: 0.7 mg/dL (ref 0.3–1.2)
Total Protein: 7.6 g/dL (ref 6.0–8.3)

## 2010-12-04 LAB — HIGH SENSITIVITY CRP: CRP, High Sensitivity: 2.13 mg/L (ref 0.00–5.00)

## 2010-12-04 LAB — CK: Total CK: 144 U/L (ref 7–177)

## 2010-12-04 NOTE — Assessment & Plan Note (Signed)
No change, she is as usual

## 2010-12-04 NOTE — Assessment & Plan Note (Signed)
This is well-controlled.

## 2010-12-04 NOTE — Assessment & Plan Note (Signed)
Check CMP and FLP today

## 2010-12-04 NOTE — Assessment & Plan Note (Signed)
Ortho referral done at her request

## 2010-12-04 NOTE — Assessment & Plan Note (Signed)
I will check labs to look for inflammatory myopathy

## 2010-12-04 NOTE — Progress Notes (Signed)
Subjective:    Patient ID: [name], female    DOB: 20-Oct-1935, 75 y.o.   MRN: [id number]  Hyperlipidemia This is a chronic problem. The current episode started more than 1 year ago. The problem is uncontrolled. Recent lipid tests were reviewed and are variable. She has no history of chronic renal disease, diabetes, hypothyroidism, liver disease, obesity or nephrotic syndrome. Factors aggravating her hyperlipidemia include beta blockers. Associated symptoms include myalgias (she hurts all over her body (chest, arms, legs, jionts)). Pertinent negatives include no chest pain, focal sensory loss, focal weakness, leg pain or shortness of breath. She is currently on no antihyperlipidemic treatment. Compliance problems include medication side effects, adherence to exercise and adherence to diet.  Risk factors for coronary artery disease include no known risk factors.  Hypertension This is a chronic problem. The current episode started more than 1 year ago. The problem has been gradually improving since onset. The problem is controlled. Pertinent negatives include no anxiety, blurred vision, chest pain, headaches, malaise/fatigue, neck pain, orthopnea, palpitations, peripheral edema, PND, shortness of breath or sweats. There are no associated agents to hypertension. Past treatments include beta blockers and diuretics. The current treatment provides significant improvement. Compliance problems include exercise and diet.  There is no history of chronic renal disease.      Review of Systems  Constitutional: Negative for fever, chills, malaise/fatigue, diaphoresis, activity change, appetite change, fatigue and unexpected weight change.  HENT: Negative for sore throat, facial swelling, trouble swallowing, neck pain, neck stiffness and voice change.   Eyes: Negative.  Negative for blurred vision.  Respiratory: Negative for cough, choking, chest tightness, shortness of breath, wheezing and stridor.     Cardiovascular: Negative for chest pain, palpitations, orthopnea and PND.  Gastrointestinal: Negative for nausea, vomiting, diarrhea, constipation and anal bleeding.  Genitourinary: Negative.   Musculoskeletal: Positive for myalgias (she hurts all over her body (chest, arms, legs, jionts)) and arthralgias (left ankle pain). Negative for back pain, joint swelling and gait problem.  Neurological: Negative for dizziness, tremors, focal weakness, seizures, syncope, facial asymmetry, speech difficulty, weakness, light-headedness, numbness and headaches.  Hematological: Negative for adenopathy. Does not bruise/bleed easily.  Psychiatric/Behavioral: Negative.        Objective:   Physical Exam  Vitals reviewed. Constitutional: She is oriented to person, place, and time. She appears well-developed and well-nourished. No distress.  HENT:  Head: Normocephalic and atraumatic.  Right Ear: External ear normal.  Left Ear: External ear normal.  Nose: Nose normal.  Mouth/Throat: Oropharynx is clear and moist. No oropharyngeal exudate.  Eyes: Conjunctivae and EOM are normal. Pupils are equal, round, and reactive to light. Right eye exhibits no discharge. Left eye exhibits no discharge. No scleral icterus.  Neck: Normal range of motion. Neck supple. No JVD present. No tracheal deviation present. No thyromegaly present.  Cardiovascular: Normal rate, regular rhythm, normal heart sounds and intact distal pulses.  Exam reveals no gallop and no friction rub.   No murmur heard. Pulmonary/Chest: Effort normal and breath sounds normal. No stridor. No respiratory distress. She has no wheezes. She has no rales. She exhibits no tenderness.  Abdominal: Soft. She exhibits no distension and no mass. There is no tenderness. There is no rebound and no guarding.  Musculoskeletal: Normal range of motion. She exhibits no edema and no tenderness.  Lymphadenopathy:    She has no cervical adenopathy.  Neurological: She is  alert and oriented to person, place, and time. She has normal reflexes. She displays normal reflexes.  No cranial nerve deficit. She exhibits normal muscle tone. Coordination normal.  Skin: Skin is warm and dry. No rash noted. She is not diaphoretic. No erythema. No pallor.  Psychiatric: She has a normal mood and affect. Her behavior is normal. Judgment and thought content normal.        Lab Results  Component Value Date   WBC 5.0 11/13/2010   HGB 11.9* 11/13/2010   HCT 34.7* 11/13/2010   PLT 184.0 11/13/2010   CHOL 227* 05/30/2010   TRIG 119.0 05/30/2010   HDL 57.10 05/30/2010   LDLDIRECT 134.0 05/30/2010   ALT 15 11/13/2010   AST 20 11/13/2010   NA 135 11/13/2010   K 4.2 11/13/2010   CL 99 11/13/2010   CREATININE 0.8 11/13/2010   BUN 10 11/13/2010   CO2 29 11/13/2010   TSH 0.40 05/30/2010   INR 1.0 ratio 05/27/2009   HGBA1C  Value: 5.7 (NOTE)                                                                       According to the ADA Clinical Practice Recommendations for 2011, when HbA1c is used as a screening test:   >=6.5%   Diagnostic of Diabetes Mellitus           (if abnormal result  is confirmed)  5.7-6.4%   Increased risk of developing Diabetes Mellitus  References:Diagnosis and Classification of Diabetes Mellitus,Diabetes Care,2011,34(Suppl 1):S62-S69 and Standards of Medical Care in         Diabetes - 2011,Diabetes Care,2011,34  (Suppl 1):S11-S61.* 03/18/2010    Assessment & Plan:

## 2010-12-04 NOTE — Patient Instructions (Signed)

## 2010-12-04 NOTE — Assessment & Plan Note (Signed)
This is stable based on her recent thyroid u/s

## 2010-12-04 NOTE — Assessment & Plan Note (Signed)
At her request I will recheck her K+ level

## 2010-12-05 LAB — LDL CHOLESTEROL, DIRECT: Direct LDL: 130.1 mg/dL

## 2010-12-05 LAB — SEDIMENTATION RATE: Sed Rate: 31 mm/hr — ABNORMAL HIGH (ref 0–22)

## 2010-12-11 ENCOUNTER — Telehealth: Payer: Self-pay | Admitting: *Deleted

## 2010-12-11 ENCOUNTER — Ambulatory Visit: Payer: No Typology Code available for payment source | Admitting: Internal Medicine

## 2010-12-11 NOTE — Telephone Encounter (Signed)
LMOM to inform Pt. 

## 2010-12-11 NOTE — Telephone Encounter (Signed)
normal

## 2010-12-11 NOTE — Telephone Encounter (Signed)
Pt requesting 12/04/10 lab results.

## 2010-12-15 ENCOUNTER — Ambulatory Visit
Admission: RE | Admit: 2010-12-15 | Discharge: 2010-12-15 | Disposition: A | Discharge status: Home / Self Care | Payer: No Typology Code available for payment source | Source: Ambulatory Visit | Attending: Internal Medicine | Admitting: Internal Medicine

## 2010-12-15 ENCOUNTER — Ambulatory Visit: Payer: No Typology Code available for payment source

## 2010-12-15 ENCOUNTER — Telehealth: Payer: Self-pay

## 2010-12-15 DIAGNOSIS — Z1231 Encounter for screening mammogram for malignant neoplasm of breast: Secondary | ICD-10-CM

## 2010-12-15 NOTE — Telephone Encounter (Signed)
Patient called [facility] requesting most recent lab results. Please advise, PCP is out of the office Thanks

## 2010-12-15 NOTE — Telephone Encounter (Signed)
12/04/10 labs were normal--good

## 2010-12-16 NOTE — Telephone Encounter (Signed)
Left message on machine for pt to return my call  

## 2010-12-16 NOTE — Telephone Encounter (Signed)
Pt advised of results in detail, copy mailed per pt request, address verified.

## 2010-12-19 ENCOUNTER — Ambulatory Visit: Payer: No Typology Code available for payment source | Admitting: Gastroenterology

## 2010-12-30 ENCOUNTER — Encounter: Payer: Self-pay | Admitting: Internal Medicine

## 2011-01-29 ENCOUNTER — Telehealth: Payer: Self-pay

## 2011-01-29 MED ORDER — CLONAZEPAM 0.5 MG PO TABS: 0.5000 mg | ORAL_TABLET | Freq: Two times a day (BID) | ORAL | Status: DC | PRN

## 2011-01-29 NOTE — Telephone Encounter (Signed)
Please advise if ok to refill clonazepam 0.5 mg

## 2011-01-29 NOTE — Telephone Encounter (Signed)
yes

## 2011-02-03 ENCOUNTER — Ambulatory Visit: Payer: No Typology Code available for payment source | Admitting: Gastroenterology

## 2011-02-05 ENCOUNTER — Encounter: Payer: Self-pay | Admitting: Internal Medicine

## 2011-02-05 ENCOUNTER — Ambulatory Visit (INDEPENDENT_AMBULATORY_CARE_PROVIDER_SITE_OTHER): Payer: No Typology Code available for payment source | Admitting: Internal Medicine

## 2011-02-05 VITALS — BP 132/79 | HR 62 | Ht 64.0 in | Wt 167.0 lb

## 2011-02-05 DIAGNOSIS — I471 Supraventricular tachycardia: Secondary | ICD-10-CM

## 2011-02-05 DIAGNOSIS — I498 Other specified cardiac arrhythmias: Secondary | ICD-10-CM

## 2011-02-05 DIAGNOSIS — E785 Hyperlipidemia, unspecified: Secondary | ICD-10-CM

## 2011-02-05 DIAGNOSIS — I712 Thoracic aortic aneurysm, without rupture: Secondary | ICD-10-CM

## 2011-02-05 DIAGNOSIS — I10 Essential (primary) hypertension: Secondary | ICD-10-CM

## 2011-02-05 DIAGNOSIS — E782 Mixed hyperlipidemia: Secondary | ICD-10-CM

## 2011-02-05 MED ORDER — FENOFIBRATE 145 MG PO TABS: 145.0000 mg | ORAL_TABLET | Freq: Every day | ORAL | Status: DC

## 2011-02-05 NOTE — Patient Instructions (Signed)
Start Tricor 145mg  every day.  Fasting Lab work... Lipid and ast in 8 weeks.

## 2011-02-05 NOTE — Progress Notes (Signed)
HPI Patient is a 75 year old with a hsitory of hypertension, dyslipidemia and a small thoracic aortic aneurysm.  I saw her in clininc 6 months ago Since seen she has done OK  She is very anxious.  Denies signif CP.  No palptitions.  Did have some dizziness yesterday.  Has a history of vertigo.  Did take one antivert. Today it has mostly resolved.  Allergies  Allergen Reactions  . Ibuprofen     REACTION: Blurry vision  . Rosuvastatin     REACTION: muscle aches    Current Outpatient Prescriptions  Medication Sig Dispense Refill  . aspirin 81 MG EC tablet Take 81 mg by mouth daily.        . clonazePAM (KLONOPIN) 0.5 MG tablet Take 1 tablet (0.5 mg total) by mouth 2 (two) times daily as needed. For anxiety   30 tablet  5  . hydrochlorothiazide (,MICROZIDE/HYDRODIURIL,) 12.5 MG capsule Take 12.5 mg by mouth daily.        . Multiple Vitamins-Minerals (MULTIVITAMIN,TX-MINERALS) tablet Take 1 tablet by mouth daily.        . nebivolol (BYSTOLIC) 10 MG tablet Take 1 tablet (10 mg total) by mouth 2 (two) times daily.  60 tablet  11  . potassium chloride SA (K-DUR,KLOR-CON) 20 MEQ tablet Take 1 tablet (20 mEq total) by mouth daily.  30 tablet  11  . fenofibrate (TRICOR) 145 MG tablet Take 1 tablet (145 mg total) by mouth daily.  30 tablet  11    Past Medical History  Diagnosis Date  . Palpitations   . HTN (hypertension)   . Macular degeneration (senile) of retina, unspecified   . Nontoxic uninodular goiter   . Diffuse cystic mastopathy   . Anxiety   . Hyperlipidemia   . History of colonoscopy     Past Surgical History  Procedure Date  . Total knee arthroplasty     Family History  Problem Relation Age of Onset  . Colon cancer Neg Hx   . Coronary artery disease Other     History   Social History  . Marital Status: Divorced    Spouse Name: N/A    Number of Children: N/A  . Years of Education: N/A   Occupational History  . Not on file.   Social History Main Topics  .  Smoking status: Never Smoker   . Smokeless tobacco: Not on file  . Alcohol Use: No  . Drug Use: No  . Sexually Active: No   Other Topics Concern  . Not on file   Social History Narrative   Regular Exercise -  NO    Review of Systems:  All systems reviewed.  They are negative to the above problem except as previously stated.  Vital Signs: BP 132/79  Pulse 62  Ht 5\' 4"  (1.626 m)  Wt 167 lb (75.751 kg)  BMI 28.67 kg/m2  Physical Exam  Patient is in NAD. HEENT:  Normocephalic, atraumatic. EOMI, PERRLA.  Neck: JVP is normal. No thyromegaly. No bruits.  Lungs: clear to auscultation. No rales no wheezes.  Heart: Regular rate and rhythm. Normal S1, S2. No S3.   No significant murmurs. PMI not displaced.  Abdomen:  Supple, nontender. Normal bowel sounds. No masses. No hepatomegaly.  Extremities:   Good distal pulses throughout. No lower extremity edema.  Musculoskeletal :moving all extremities.  Neuro:   alert and oriented x3.  CN II-XII grossly intact.  EKG:  NSR.  60 bpm.  First degree AV block.  LVH.   Assessment and Plan:

## 2011-02-09 NOTE — Assessment & Plan Note (Addendum)
Mild dilation of aorta.  Will need to check on timing for f/u and base return on this.  WIll set f/u CT for next June

## 2011-02-09 NOTE — Assessment & Plan Note (Signed)
Patient is intolerant to statins.  I would try tricor.  F/U lipids.

## 2011-02-09 NOTE — Assessment & Plan Note (Signed)
BP is adequate on exam.  I would not change regimen.  I am not convinced that dizziness is related to bp.  More likely vertigo.  It is improving.

## 2011-03-19 LAB — COMPREHENSIVE METABOLIC PANEL
ALT: 24
AST: 23
Albumin: 3.5
Alkaline Phosphatase: 67
BUN: 11
CO2: 28
Calcium: 9.1
Chloride: 105
Creatinine, Ser: 0.68
GFR calc Af Amer: >60
GFR calc non Af Amer: >60
Glucose, Bld: 98
Potassium: 3.7
Sodium: 138
Total Bilirubin: 0.9
Total Protein: 7.2

## 2011-03-19 LAB — URINALYSIS, ROUTINE W REFLEX MICROSCOPIC
Bilirubin Urine: NEGATIVE
Glucose, UA: NEGATIVE
Hgb urine dipstick: NEGATIVE
Ketones, ur: NEGATIVE
Nitrite: NEGATIVE
Protein, ur: NEGATIVE
Specific Gravity, Urine: 1.01
Urobilinogen, UA: 0.2
pH: 7

## 2011-03-19 LAB — CBC
HCT: 35.1 — ABNORMAL LOW
Hemoglobin: 12.1
MCHC: 34.4
MCV: 96.9
Platelets: 166
RBC: 3.63 — ABNORMAL LOW
RDW: 13.6
WBC: 5.5

## 2011-03-25 ENCOUNTER — Ambulatory Visit (INDEPENDENT_AMBULATORY_CARE_PROVIDER_SITE_OTHER): Payer: No Typology Code available for payment source | Admitting: Internal Medicine

## 2011-03-25 ENCOUNTER — Encounter: Payer: Self-pay | Admitting: Internal Medicine

## 2011-03-25 VITALS — BP 126/80 | HR 57 | Temp 98.8°F | Resp 16

## 2011-03-25 DIAGNOSIS — I10 Essential (primary) hypertension: Secondary | ICD-10-CM

## 2011-03-25 DIAGNOSIS — Z23 Encounter for immunization: Secondary | ICD-10-CM

## 2011-03-25 DIAGNOSIS — J370 Chronic laryngitis: Secondary | ICD-10-CM

## 2011-03-25 NOTE — Patient Instructions (Signed)
Laryngitis Laryngitis is an inflammation and swelling of the voice box and the area around it. It may cause your voice to change. You may lose your voice entirely for a short while. The voice box (larynx) is located at the top of the airway to the lungs (windpipe, trachea). It contains the vocal cords. When the vocal cords become inflamed or infected, they swell. This is usually linked with hoarseness. It is also linked with loss of voice. If severe, this condition can block the airway. The problem is most common in late fall, winter, or early spring. With or without treatment, you should be well in 7 to 14 days. The most common form of laryngitis is usually caused by a virus. It may also be part of a bacterial (germ) infection, or part of a common cold, bronchitis, flu, or pneumonia. People who smoke, have allergies, or strain their voices by yelling, talking, or singing may also develop laryngitis. Laryngitis often follows or occurs during a viral upper respiratory infection. It usually gets better without treatment. Laryngitis is usually not associated with breathing difficulties. There are forms of laryngitis that happen in children. These forms can lead to serious or fatal respiratory blockage. Croup and epiglotitis (rare) are two of these forms. Other causes of laryngitis include: laryngeal polyps; laryngeal paralysis (such as Horner Syndrome); malignant (cancerous) tumors (lumps) and changes that lead up to the tumor, allergies, and injury. SOME COMMON SYMPTOMS OF LARYNGITIS INCLUDE:  Recent or current upper respiratory infection.   Hoarse, low voice and a scratchy, possibly sore throat. You also might lose your voice. You may come down with a fever. You may feel like you have a lump in your throat. You may feel very tired.   Dry cough or the barking cough of croup.   Fever.   Swollen lymph nodes or glands in the neck.   Drooping eyelid on one side (Horner Syndrome).  DIAGNOSIS  A physical  examination is usually all that is necessary for the patient with hoarseness associated with a respiratory tract infection. A patient, particularly a smoker, who has continuing hoarseness, will need to see another caregiver for tests of the throat and upper airway. PREVENTION  Avoidance of upper respiratory infections during cold and flu season may help. Using good hygienic practices such as hand washing, and avoiding people with respiratory illnesses and crowded close quarters, may also help.   Quitting smoking can help prevent laryngitis, malignancies of the head and neck, and of the lungs.   Do not smoke around or in the home of a child with respiratory problems.  HOME CARE INSTRUCTIONS  Do not use your voice for several days. Either speak very softly or write notes until you can talk normally. Discourage your child from talking out loud.   Give or take plenty of warm drinks to soothe the throat.   Use a cool-mist humidifier (vaporizer) to increase air moisture. This will help relieve the tight feeling in your throat. Hot, steamy showers can also help. Keep the air humid in a child's room.   Do not drink alcohol or smoke until your voice is back to normal. Quit smoking.   Get plenty of rest.   Drink extra fluids, such as water, fruit juice, and tea.   Only take over-the-counter or prescription medicines for pain, discomfort, or fever as directed by your caregiver.   Your caregiver may prescribe an antibiotic (medications that kill germs) to treat infection caused by bacteria (germs). This will not be   done if the infection is viral. Since most common laryngitis is viral, treatment with antibiotics is generally not helpful. Antibiotics do not affect viruses. Their improper use may cause more harm than good.  SEEK IMMEDIATE MEDICAL CARE IF:  There is difficulty breathing, swallowing, or if drooling is present in a small child.   If hoarseness has lasted for more than 1 week in a child,  or 2 weeks in an adult.   You develop a temperature greater than 100.5. A fever above 100.4 F (38 C) may indicate the presence of a bacterial infection, such as bronchitis, tonsillitis or sinusitis, which may require an antibiotic.   There is hoarseness lasting longer than 7 days.   There is bleeding from the throat.   The throat is getting worse rather than better.   There are large, tender lumps ("swollen glands") in the neck.   The barking cough of croup develops.  COMPLICATIONS  Laryngitis is rarely serious. It hardly ever lasts more than 7 days.   It can be part of a more serious infection such as tonsillitis or bronchitis.   In young children, a swollen larynx can obstruct the passage of air. This causes breathing difficulties and croup. This is a more serious complication.   If a viral infection is followed by a bacterial (germ) infection causing tonsillitis or bronchitis, your caregiver may prescribe antibiotics.   If laryngitis develops into croup, seek medical treatment immediately.  MAKE SURE YOU:   Understand these instructions.   Will watch your condition.   Will get help right away if you are not doing well or get worse.  Document Released: 06/08/2005 Document Re-Released: 09/04/2008 Exit[employer] Patient Information 2011 [employer]. 

## 2011-03-25 NOTE — Assessment & Plan Note (Signed)
ENT referral at her request 

## 2011-03-25 NOTE — Assessment & Plan Note (Signed)
Her BP is well controlled 

## 2011-03-25 NOTE — Progress Notes (Signed)
Subjective:    Patient ID: [name], female    DOB: 11-21-1935, 75 y.o.   MRN: [id number]  HPI  She returns complaining of laryngitis for one month. When she was brushing her teeth 4 days ago she saw a spot of blood in her spit but she has not noticed anymore blood since then.  Review of Systems  Constitutional: Negative for fever, chills, diaphoresis, activity change, appetite change, fatigue and unexpected weight change.  HENT: Positive for congestion, rhinorrhea, sneezing and voice change. Negative for hearing loss, ear pain, nosebleeds, sore throat, facial swelling, drooling, mouth sores, trouble swallowing, neck pain, neck stiffness, dental problem, postnasal drip, sinus pressure, tinnitus and ear discharge.   Eyes: Negative.   Respiratory: Negative.   Cardiovascular: Negative for chest pain, palpitations and leg swelling.  Gastrointestinal: Negative for nausea, vomiting, abdominal pain, diarrhea, constipation, blood in stool, abdominal distention, anal bleeding and rectal pain.  Genitourinary: Negative.   Skin: Negative for color change, pallor, rash and wound.  Neurological: Negative.  Negative for dizziness, tremors, seizures, syncope, facial asymmetry, speech difficulty, weakness, light-headedness, numbness and headaches.  Hematological: Negative.   Psychiatric/Behavioral: Negative.        Objective:   Physical Exam  Vitals reviewed. Constitutional: She is oriented to person, place, and time. She appears well-developed and well-nourished. No distress.  HENT:  Head: Normocephalic and atraumatic. No trismus in the jaw.  Right Ear: Hearing, external ear and ear canal normal.  Nose: Mucosal edema and rhinorrhea present. No nose lacerations, sinus tenderness, nasal deformity, septal deviation or nasal septal hematoma. No epistaxis.  No foreign bodies. Right sinus exhibits no maxillary sinus tenderness and no frontal sinus tenderness. Left sinus exhibits no maxillary sinus  tenderness and no frontal sinus tenderness.  Mouth/Throat: Oropharynx is clear and moist and mucous membranes are normal. Mucous membranes are not pale, not dry and not cyanotic. No oral lesions. No uvula swelling. No oropharyngeal exudate, posterior oropharyngeal edema, posterior oropharyngeal erythema or tonsillar abscesses.  Eyes: Conjunctivae are normal. Right eye exhibits no discharge. Left eye exhibits no discharge. No scleral icterus.  Neck: Normal range of motion. Neck supple. No JVD present. No tracheal deviation present. No thyromegaly present.  Cardiovascular: Normal rate, regular rhythm, normal heart sounds and intact distal pulses.  Exam reveals no gallop and no friction rub.   No murmur heard. Pulmonary/Chest: Effort normal and breath sounds normal. No stridor. No respiratory distress. She has no wheezes. She has no rales. She exhibits no tenderness.  Abdominal: Soft. Bowel sounds are normal. She exhibits no distension and no mass. There is no tenderness. There is no rebound and no guarding.  Musculoskeletal: Normal range of motion. She exhibits no edema and no tenderness.  Lymphadenopathy:    She has no cervical adenopathy.  Neurological: She is oriented to person, place, and time.  Skin: Skin is warm and dry. No rash noted. She is not diaphoretic. No erythema. No pallor.  Psychiatric: She has a normal mood and affect. Her behavior is normal. Judgment and thought content normal.      Lab Results  Component Value Date   WBC 5.0 11/13/2010   HGB 11.9* 11/13/2010   HCT 34.7* 11/13/2010   PLT 184.0 11/13/2010   CHOL 219* 12/04/2010   TRIG 134.0 12/04/2010   HDL 62.30 12/04/2010   LDLDIRECT 130.1 12/04/2010   ALT 13 12/04/2010   AST 21 12/04/2010   NA 139 12/04/2010   K 3.6 12/04/2010   CL 105 12/04/2010  CREATININE 0.8 12/04/2010   BUN 12 12/04/2010   CO2 28 12/04/2010   TSH 0.40 05/30/2010   INR 1.0 ratio 05/27/2009   HGBA1C  Value: 5.7 (NOTE)                                                                        According to the ADA Clinical Practice Recommendations for 2011, when HbA1c is used as a screening test:   >=6.5%   Diagnostic of Diabetes Mellitus           (if abnormal result  is confirmed)  5.7-6.4%   Increased risk of developing Diabetes Mellitus  References:Diagnosis and Classification of Diabetes Mellitus,Diabetes Care,2011,34(Suppl 1):S62-S69 and Standards of Medical Care in         Diabetes - 2011,Diabetes Care,2011,34  (Suppl 1):S11-S61.* 03/18/2010      Assessment & Plan:

## 2011-03-30 ENCOUNTER — Other Ambulatory Visit: Payer: No Typology Code available for payment source | Admitting: *Deleted

## 2011-04-08 ENCOUNTER — Ambulatory Visit: Payer: No Typology Code available for payment source | Admitting: Internal Medicine

## 2011-04-15 ENCOUNTER — Other Ambulatory Visit (INDEPENDENT_AMBULATORY_CARE_PROVIDER_SITE_OTHER): Payer: No Typology Code available for payment source | Admitting: *Deleted

## 2011-04-15 ENCOUNTER — Other Ambulatory Visit: Payer: Self-pay | Admitting: Internal Medicine

## 2011-04-15 DIAGNOSIS — E782 Mixed hyperlipidemia: Secondary | ICD-10-CM

## 2011-04-15 LAB — LIPID PANEL
Cholesterol: 224 mg/dL — ABNORMAL HIGH (ref 0–200)
HDL: 64 mg/dL (ref >39.00)
Total CHOL/HDL Ratio: 4
Triglycerides: 106 mg/dL (ref 0.0–149.0)
VLDL: 21.2 mg/dL (ref 0.0–40.0)

## 2011-04-15 LAB — LDL CHOLESTEROL, DIRECT: Direct LDL: 139.2 mg/dL

## 2011-04-15 LAB — AST: AST: 18 U/L (ref 0–37)

## 2011-04-20 ENCOUNTER — Other Ambulatory Visit: Payer: Self-pay | Admitting: *Deleted

## 2011-04-20 MED ORDER — HYDROCHLOROTHIAZIDE 12.5 MG PO CAPS: 12.5000 mg | ORAL_CAPSULE | Freq: Every day | ORAL | Status: DC

## 2011-04-20 NOTE — Progress Notes (Signed)
REFILL requested. DONE, Patient informed

## 2011-04-21 ENCOUNTER — Other Ambulatory Visit: Payer: Self-pay | Admitting: Internal Medicine

## 2011-04-21 NOTE — Telephone Encounter (Signed)
Pharmacy notified x 3

## 2011-04-21 NOTE — Telephone Encounter (Signed)
The pt called and is requesting a refill of hydrochlorothiazide 12.5 mg called into the [facility]   Thanks!

## 2011-04-24 ENCOUNTER — Other Ambulatory Visit: Payer: Self-pay | Admitting: *Deleted

## 2011-04-24 DIAGNOSIS — E782 Mixed hyperlipidemia: Secondary | ICD-10-CM

## 2011-04-24 DIAGNOSIS — E7800 Pure hypercholesterolemia, unspecified: Secondary | ICD-10-CM

## 2011-04-24 MED ORDER — PRAVASTATIN SODIUM 10 MG PO TABS: 10.0000 mg | ORAL_TABLET | Freq: Every evening | ORAL | Status: DC

## 2011-05-19 ENCOUNTER — Encounter: Payer: Self-pay | Admitting: Internal Medicine

## 2011-05-19 ENCOUNTER — Ambulatory Visit (INDEPENDENT_AMBULATORY_CARE_PROVIDER_SITE_OTHER): Payer: No Typology Code available for payment source | Admitting: Internal Medicine

## 2011-05-19 VITALS — BP 110/76 | HR 63 | Temp 97.6°F | Resp 16 | Ht 66.0 in | Wt 162.0 lb

## 2011-05-19 DIAGNOSIS — J370 Chronic laryngitis: Secondary | ICD-10-CM

## 2011-05-19 DIAGNOSIS — I10 Essential (primary) hypertension: Secondary | ICD-10-CM

## 2011-05-19 NOTE — Patient Instructions (Signed)

## 2011-05-20 ENCOUNTER — Encounter: Payer: Self-pay | Admitting: Internal Medicine

## 2011-05-20 NOTE — Progress Notes (Signed)
  Subjective:    Patient ID: [name], female    DOB: [birth year], 75 y.o.   MRN: [id number]  Hypertension This is a chronic problem. The current episode started more than 1 year ago. The problem has been gradually improving since onset. The problem is controlled. Pertinent negatives include no anxiety, blurred vision, chest pain, headaches, malaise/fatigue, neck pain, orthopnea, palpitations, peripheral edema, PND, shortness of breath or sweats. There are no associated agents to hypertension. Past treatments include beta blockers and diuretics. The current treatment provides significant improvement. Compliance problems include exercise and diet.       Review of Systems  Constitutional: Negative for fever, chills, malaise/fatigue, diaphoresis, activity change, appetite change, fatigue and unexpected weight change.  HENT: Negative for ear pain, nosebleeds, congestion, sore throat, facial swelling, rhinorrhea, sneezing, drooling, mouth sores, trouble swallowing, neck pain, dental problem, voice change, postnasal drip, sinus pressure, tinnitus and ear discharge.   Eyes: Negative.  Negative for blurred vision.  Respiratory: Negative for cough, chest tightness, shortness of breath, wheezing and stridor.   Cardiovascular: Negative for chest pain, palpitations, orthopnea, leg swelling and PND.  Gastrointestinal: Negative for nausea, vomiting, abdominal pain, diarrhea, constipation and blood in stool.  Genitourinary: Negative for dysuria, urgency, frequency, hematuria, flank pain, decreased urine volume, enuresis, difficulty urinating and dyspareunia.  Musculoskeletal: Negative for myalgias, back pain, joint swelling, arthralgias and gait problem.  Skin: Negative for color change, pallor, rash and wound.  Neurological: Negative for dizziness, tremors, seizures, syncope, facial asymmetry, speech difficulty, weakness, light-headedness, numbness and headaches.  Hematological: Negative for adenopathy.  Does not bruise/bleed easily.  Psychiatric/Behavioral: Negative.        Objective:   Physical Exam  Vitals reviewed. Constitutional: She is oriented to person, place, and time. She appears well-developed and well-nourished. No distress.  HENT:  Head: Normocephalic and atraumatic.  Mouth/Throat: Oropharynx is clear and moist. No oropharyngeal exudate.  Eyes: Conjunctivae are normal. Right eye exhibits no discharge. Left eye exhibits no discharge. No scleral icterus.  Neck: Normal range of motion. Neck supple. No JVD present. No tracheal deviation present. No thyromegaly present.  Cardiovascular: Normal rate, regular rhythm, normal heart sounds and intact distal pulses.  Exam reveals no gallop and no friction rub.   No murmur heard. Pulmonary/Chest: Effort normal and breath sounds normal. No stridor. No respiratory distress. She has no wheezes. She has no rales. She exhibits no tenderness.  Abdominal: Soft. Bowel sounds are normal. She exhibits no distension and no mass. There is no tenderness. There is no rebound and no guarding.  Musculoskeletal: Normal range of motion. She exhibits no edema and no tenderness.  Lymphadenopathy:    She has no cervical adenopathy.  Neurological: She is oriented to person, place, and time.  Skin: Skin is warm and dry. No rash noted. She is not diaphoretic. No erythema. No pallor.  Psychiatric: She has a normal mood and affect. Her behavior is normal. Judgment and thought content normal.          Assessment & Plan:

## 2011-05-20 NOTE — Assessment & Plan Note (Signed)
Her BP is well controlled 

## 2011-05-20 NOTE — Assessment & Plan Note (Signed)
By my observation this has resolved but she insists that it is still a problem, she did not go thru with the previous referral to ENT b/c "her medicaid card had not been right", she asked me today to refer her to ENT again so I obliged

## 2011-05-27 ENCOUNTER — Ambulatory Visit: Payer: No Typology Code available for payment source | Admitting: Internal Medicine

## 2011-06-18 ENCOUNTER — Telehealth: Payer: Self-pay

## 2011-06-18 NOTE — Telephone Encounter (Signed)
Patient called [facility] requesting a call back about BP medication. I returned call back to patient//[facility] for her to call back and give detailed information.

## 2011-06-19 MED ORDER — HYDROCHLOROTHIAZIDE 12.5 MG PO CAPS: 12.5000 mg | ORAL_CAPSULE | Freq: Two times a day (BID) | ORAL | Status: DC

## 2011-06-19 NOTE — Telephone Encounter (Signed)
Spoke with patient and rx sent in

## 2011-06-24 ENCOUNTER — Other Ambulatory Visit (INDEPENDENT_AMBULATORY_CARE_PROVIDER_SITE_OTHER): Payer: No Typology Code available for payment source | Admitting: *Deleted

## 2011-06-24 DIAGNOSIS — E782 Mixed hyperlipidemia: Secondary | ICD-10-CM

## 2011-06-24 LAB — AST: AST: 17 U/L (ref 0–37)

## 2011-06-24 LAB — LIPID PANEL
Cholesterol: 178 mg/dL (ref 0–200)
HDL: 70.2 mg/dL (ref >39.00)
LDL Cholesterol: 91 mg/dL (ref 0–99)
Total CHOL/HDL Ratio: 3
Triglycerides: 85 mg/dL (ref 0.0–149.0)
VLDL: 17 mg/dL (ref 0.0–40.0)

## 2011-06-25 ENCOUNTER — Other Ambulatory Visit: Payer: No Typology Code available for payment source | Admitting: *Deleted

## 2011-07-04 ENCOUNTER — Encounter (HOSPITAL_COMMUNITY): Payer: Self-pay | Admitting: Critical Care Medicine

## 2011-07-04 ENCOUNTER — Encounter (HOSPITAL_COMMUNITY): Payer: Self-pay | Admitting: Otolaryngology

## 2011-07-04 ENCOUNTER — Encounter (HOSPITAL_COMMUNITY): Payer: Self-pay | Admitting: *Deleted

## 2011-07-04 ENCOUNTER — Encounter (HOSPITAL_COMMUNITY)
Admission: EM | Disposition: A | Discharge status: Home / Self Care | Payer: Self-pay | Source: Home / Self Care | Attending: Emergency Medicine

## 2011-07-04 ENCOUNTER — Emergency Department (HOSPITAL_COMMUNITY): Payer: No Typology Code available for payment source | Admitting: Critical Care Medicine

## 2011-07-04 ENCOUNTER — Emergency Department (HOSPITAL_COMMUNITY)
Admission: EM | Admit: 2011-07-04 | Discharge: 2011-07-04 | Disposition: A | Discharge status: Home / Self Care | Payer: No Typology Code available for payment source | Attending: Emergency Medicine | Admitting: Emergency Medicine

## 2011-07-04 ENCOUNTER — Other Ambulatory Visit: Payer: Self-pay

## 2011-07-04 DIAGNOSIS — R040 Epistaxis: Secondary | ICD-10-CM | POA: Diagnosis present

## 2011-07-04 DIAGNOSIS — E785 Hyperlipidemia, unspecified: Secondary | ICD-10-CM | POA: Insufficient documentation

## 2011-07-04 DIAGNOSIS — H3530 Unspecified macular degeneration: Secondary | ICD-10-CM | POA: Insufficient documentation

## 2011-07-04 DIAGNOSIS — I10 Essential (primary) hypertension: Secondary | ICD-10-CM | POA: Insufficient documentation

## 2011-07-04 LAB — CBC
HCT: 31.5 % — ABNORMAL LOW (ref 36.0–46.0)
Hemoglobin: 11.1 g/dL — ABNORMAL LOW (ref 12.0–15.0)
MCH: 32.6 pg (ref 26.0–34.0)
MCHC: 35.2 g/dL (ref 30.0–36.0)
MCV: 92.4 fL (ref 78.0–100.0)
Platelets: 183 10*3/uL (ref 150–400)
RBC: 3.41 MIL/uL — ABNORMAL LOW (ref 3.87–5.11)
RDW: 13.1 % (ref 11.5–15.5)
WBC: 5 10*3/uL (ref 4.0–10.5)

## 2011-07-04 LAB — DIFFERENTIAL
Basophils Absolute: 0 10*3/uL (ref 0.0–0.1)
Basophils Relative: 1 % (ref 0–1)
Eosinophils Absolute: 0.2 10*3/uL (ref 0.0–0.7)
Eosinophils Relative: 3 % (ref 0–5)
Lymphocytes Relative: 41 % (ref 12–46)
Lymphs Abs: 2.1 10*3/uL (ref 0.7–4.0)
Monocytes Absolute: 0.3 10*3/uL (ref 0.1–1.0)
Monocytes Relative: 5 % (ref 3–12)
Neutro Abs: 2.5 10*3/uL (ref 1.7–7.7)
Neutrophils Relative %: 50 % (ref 43–77)

## 2011-07-04 LAB — POCT I-STAT, CHEM 8
BUN: 14 mg/dL (ref 6–23)
Calcium, Ion: 1.22 mmol/L (ref 1.12–1.32)
Chloride: 106 mEq/L (ref 96–112)
Creatinine, Ser: 0.8 mg/dL (ref 0.50–1.10)
Glucose, Bld: 115 mg/dL — ABNORMAL HIGH (ref 70–99)
HCT: 33 % — ABNORMAL LOW (ref 36.0–46.0)
Hemoglobin: 11.2 g/dL — ABNORMAL LOW (ref 12.0–15.0)
Potassium: 3.2 mEq/L — ABNORMAL LOW (ref 3.5–5.1)
Sodium: 143 mEq/L (ref 135–145)
TCO2: 25 mmol/L (ref 0–100)

## 2011-07-04 LAB — PROTIME-INR
INR: 1.01 (ref 0.00–1.49)
Prothrombin Time: 13.5 seconds (ref 11.6–15.2)

## 2011-07-04 SURGERY — CONTROL OF EPISTAXIS, ENDOSCOPIC
Anesthesia: General | Site: Nose | Wound class: Contaminated

## 2011-07-04 MED ORDER — EPHEDRINE SULFATE 50 MG/ML IJ SOLN
INTRAMUSCULAR | Status: DC | PRN
  Administered 2011-07-04: 10 mg via INTRAVENOUS

## 2011-07-04 MED ORDER — OXYMETAZOLINE HCL 0.05 % NA SOLN
NASAL | Status: DC | PRN
  Administered 2011-07-04: 1 via NASAL

## 2011-07-04 MED ORDER — ROCURONIUM BROMIDE 100 MG/10ML IV SOLN
INTRAVENOUS | Status: DC | PRN
  Administered 2011-07-04: 10 mg via INTRAVENOUS

## 2011-07-04 MED ORDER — NEOSTIGMINE METHYLSULFATE 1 MG/ML IJ SOLN
INTRAMUSCULAR | Status: DC | PRN
  Administered 2011-07-04: 4 mg via INTRAVENOUS

## 2011-07-04 MED ORDER — OXYMETAZOLINE HCL 0.05 % NA SOLN
1.0000 | Freq: Once | NASAL | Status: AC
  Administered 2011-07-04: 1 via NASAL

## 2011-07-04 MED ORDER — PROPOFOL 10 MG/ML IV EMUL
INTRAVENOUS | Status: DC | PRN
  Administered 2011-07-04: 180 mg via INTRAVENOUS

## 2011-07-04 MED ORDER — GLYCOPYRROLATE 0.2 MG/ML IJ SOLN
INTRAMUSCULAR | Status: DC | PRN
  Administered 2011-07-04: .6 mg via INTRAVENOUS

## 2011-07-04 MED ORDER — SODIUM CHLORIDE 0.9 % IV SOLN
3.0000 g | Freq: Once | INTRAVENOUS | Status: AC
  Administered 2011-07-04: 3 g via INTRAVENOUS
  Filled 2011-07-04: qty 3

## 2011-07-04 MED ORDER — FENTANYL CITRATE 0.05 MG/ML IJ SOLN
INTRAMUSCULAR | Status: DC | PRN
  Administered 2011-07-04: 50 ug via INTRAVENOUS
  Administered 2011-07-04: 100 ug via INTRAVENOUS

## 2011-07-04 MED ORDER — METOCLOPRAMIDE HCL 5 MG/ML IJ SOLN
INTRAMUSCULAR | Status: DC | PRN
  Administered 2011-07-04: 10 mg via INTRAVENOUS

## 2011-07-04 MED ORDER — AMOXICILLIN-POT CLAVULANATE 500-125 MG PO TABS: 1.0000 | ORAL_TABLET | Freq: Two times a day (BID) | ORAL | Status: DC

## 2011-07-04 MED ORDER — TETANUS-DIPHTH-ACELL PERTUSSIS 5-2.5-18.5 LF-MCG/0.5 IM SUSP
0.5000 mL | Freq: Once | INTRAMUSCULAR | Status: AC
  Administered 2011-07-04: 0.5 mL via INTRAMUSCULAR
  Filled 2011-07-04: qty 0.5

## 2011-07-04 MED ORDER — SUCCINYLCHOLINE CHLORIDE 20 MG/ML IJ SOLN
INTRAMUSCULAR | Status: DC | PRN
  Administered 2011-07-04: 100 mg via INTRAVENOUS

## 2011-07-04 MED ORDER — MICROFIBRILLAR COLL HEMOSTAT EX PADS
MEDICATED_PAD | CUTANEOUS | Status: DC | PRN
  Administered 2011-07-04: 1 via TOPICAL

## 2011-07-04 MED ORDER — FENTANYL CITRATE 0.05 MG/ML IJ SOLN: 25.0000 ug | INTRAMUSCULAR | Status: DC | PRN

## 2011-07-04 MED ORDER — PHENYLEPHRINE HCL 10 MG/ML IJ SOLN
INTRAMUSCULAR | Status: DC | PRN
  Administered 2011-07-04 (×2): 40 ug via INTRAVENOUS

## 2011-07-04 MED ORDER — DEXAMETHASONE SODIUM PHOSPHATE 4 MG/ML IJ SOLN
INTRAMUSCULAR | Status: DC | PRN
  Administered 2011-07-04: 4 mg via INTRAVENOUS

## 2011-07-04 MED ORDER — ONDANSETRON HCL 4 MG/2ML IJ SOLN
INTRAMUSCULAR | Status: DC | PRN
  Administered 2011-07-04: 4 mg via INTRAVENOUS

## 2011-07-04 MED ORDER — 0.9 % SODIUM CHLORIDE (POUR BTL) OPTIME
TOPICAL | Status: DC | PRN
  Administered 2011-07-04: 1000 mL

## 2011-07-04 MED ORDER — SODIUM CHLORIDE 0.9 % IV SOLN
INTRAVENOUS | Status: DC | PRN
  Administered 2011-07-04: 09:00:00 via INTRAVENOUS

## 2011-07-04 MED ORDER — METOCLOPRAMIDE HCL 5 MG/ML IJ SOLN
10.0000 mg | Freq: Once | INTRAMUSCULAR | Status: DC | PRN
  Filled 2011-07-04: qty 2

## 2011-07-04 MED ORDER — OXYMETAZOLINE HCL 0.05 % NA SOLN
NASAL | Status: AC
  Administered 2011-07-04: 1 via NASAL
  Filled 2011-07-04: qty 15

## 2011-07-04 SURGICAL SUPPLY — 15 items
CANISTER SUCTION 2500CC (MISCELLANEOUS) ×2 IMPLANT
CLOTH BEACON ORANGE TIMEOUT ST (SAFETY) ×2 IMPLANT
COAGULATOR SUCT SWTCH 10FR 6 (ELECTROSURGICAL) ×2 IMPLANT
COVER MAYO STAND STRL (DRAPES) ×2 IMPLANT
GAUZE SPONGE 4X4 16PLY XRAY LF (GAUZE/BANDAGES/DRESSINGS) ×2 IMPLANT
GLOVE BIOGEL M 7.0 STRL (GLOVE) ×4 IMPLANT
GOWN EXTRA PROTECTION XL (GOWNS) ×4 IMPLANT
GOWN STRL NON-REIN LRG LVL3 (GOWN DISPOSABLE) ×2 IMPLANT
KIT BASIN OR (CUSTOM PROCEDURE TRAY) ×2 IMPLANT
KIT ROOM TURNOVER OR (KITS) ×2 IMPLANT
NS IRRIG 1000ML POUR BTL (IV SOLUTION) ×2 IMPLANT
PAD ARMBOARD 7.5X6 YLW CONV (MISCELLANEOUS) ×2 IMPLANT
SPONGE NEURO XRAY DETECT 1X3 (DISPOSABLE) ×2 IMPLANT
TOWEL OR 17X24 6PK STRL BLUE (TOWEL DISPOSABLE) ×2 IMPLANT
TUBE CONNECTING 12X1/4 (SUCTIONS) ×2 IMPLANT

## 2011-07-04 NOTE — Transfer of Care (Signed)
Immediate Anesthesia Transfer of Care Note  Patient: [name]  Procedure(s) Performed:  NASAL ENDOSCOPY WITH EPISTAXIS CONTROL  Patient Location: PACU  Anesthesia Type: General  Level of Consciousness: responds to stimulation  Airway & Oxygen Therapy: Patient Spontanous Breathing and Patient connected to face mask oxygen  Post-op Assessment: Report given to PACU RN, Post -op Vital signs reviewed and stable and Patient moving all extremities X 4  Post vital signs: Reviewed and stable Filed Vitals:   07/04/11 0703  BP: 137/90  Pulse: 77  Temp: 36.9 C  Resp: 18    Complications: No apparent anesthesia complications

## 2011-07-04 NOTE — ED Provider Notes (Signed)
History     CSN: [id number]  Arrival date & time 07/04/11  0354   First MD Initiated Contact with Patient 07/04/11 0414      Chief Complaint  Patient presents with  . Epistaxis    (Consider location/radiation/quality/duration/timing/severity/associated sxs/prior treatment) Patient is a 76 y.o. female presenting with nosebleeds. The history is provided by the patient and the EMS personnel. No language interpreter was used.  Epistaxis  This is a new problem. The current episode started 1 to 2 hours ago. The problem occurs constantly. The problem has not changed since onset.The problem is associated with aspirin. The bleeding has been from the right nare. She has tried applying pressure for the symptoms. The treatment provided no relief. Her past medical history does not include frequent nosebleeds.    Past Medical History  Diagnosis Date  . Palpitations   . HTN (hypertension)   . Macular degeneration (senile) of retina, unspecified   . Nontoxic uninodular goiter   . Diffuse cystic mastopathy   . Anxiety   . Hyperlipidemia   . History of colonoscopy   . AAA (abdominal aortic aneurysm)     Past Surgical History  Procedure Date  . Total knee arthroplasty     Family History  Problem Relation Age of Onset  . Colon cancer Neg Hx   . Cancer Neg Hx   . Heart disease Neg Hx   . Coronary artery disease Other     History  Substance Use Topics  . Smoking status: Never Smoker   . Smokeless tobacco: Not on file  . Alcohol Use: No    OB History    Grav Para Term Preterm Abortions TAB SAB Ect Mult Living                  Review of Systems  Constitutional: Negative.   HENT: Positive for nosebleeds.   Eyes: Negative.   Respiratory: Negative for wheezing.   Cardiovascular: Negative.   Genitourinary: Negative.   Musculoskeletal: Negative.   Neurological: Negative.   Hematological: Negative.   Psychiatric/Behavioral: Negative.     Allergies  Ibuprofen and  Rosuvastatin  Home Medications   Current Outpatient Rx  Name Route Sig Dispense Refill  . ASPIRIN 81 MG PO TBEC Oral Take 81 mg by mouth daily.      Marland Kitchen CLONAZEPAM 0.5 MG PO TABS Oral Take 1 tablet (0.5 mg total) by mouth 2 (two) times daily as needed. For anxiety  30 tablet 5  . HYDROCHLOROTHIAZIDE 12.5 MG PO CAPS Oral Take 1 capsule (12.5 mg total) by mouth 2 (two) times daily. 60 capsule 4  . SUPER HIGH VITAMINS/MINERALS PO TABS Oral Take 1 tablet by mouth daily.      . NEBIVOLOL HCL 10 MG PO TABS Oral Take 1 tablet (10 mg total) by mouth 2 (two) times daily. 60 tablet 11  . POTASSIUM CHLORIDE CRYS ER 20 MEQ PO TBCR Oral Take 1 tablet (20 mEq total) by mouth daily. 30 tablet 11  . PRAVASTATIN SODIUM 10 MG PO TABS Oral Take 1 tablet (10 mg total) by mouth every evening. 30 tablet 6    BP 139/85  Pulse 93  Temp 97.7 F (36.5 C)  Resp 18  SpO2 94%  Physical Exam  Constitutional: She is oriented to person, place, and time. She appears well-developed and well-nourished.  HENT:  Head: Normocephalic and atraumatic.  Nose: Epistaxis is observed.       Anterior and posterior component  Eyes: Conjunctivae are normal.  Pupils are equal, round, and reactive to light.  Neck: Normal range of motion. Neck supple. No tracheal deviation present.  Cardiovascular: Normal rate and regular rhythm.   Pulmonary/Chest: Effort normal and breath sounds normal. She has no wheezes. She has no rales.  Abdominal: Soft. Bowel sounds are normal. There is no tenderness.  Musculoskeletal: Normal range of motion.  Neurological: She is alert and oriented to person, place, and time.  Skin: Skin is warm and dry. She is not diaphoretic.  Psychiatric: Thought content normal.    ED Course  EPISTAXIS MANAGEMENT Date/Time: 07/04/2011 4:52 AM Performed by: Jasmine Awe Authorized by: Jasmine Awe Consent: Verbal consent obtained. Patient identity confirmed: arm band Patient sedated:  no Treatment site: right anterior and right posterior Repair method: suction, silver nitrate and merocel sponge Treatment complexity: complex Patient tolerance: Patient tolerated the procedure well with no immediate complications.   (including critical care time)  Labs Reviewed  CBC - Abnormal; Notable for the following:    RBC 3.41 (*)    Hemoglobin 11.1 (*)    HCT 31.5 (*)    All other components within normal limits  DIFFERENTIAL  I-STAT, CHEM 8  PROTIME-INR   No results found.   No diagnosis found.    MDM  Continued nasal drainage of BRB. Tetanus updated case d/w Dr. Annalee Genta who will see the patient in the ED at [facility] Case d/w Dr. Jeraldine Loots in ED Case d/w Lequita Halt charge nurse.          Jasmine Awe, MD 07/04/11 [phone]

## 2011-07-04 NOTE — ED Notes (Signed)
Dr Annalee Genta here to eval pt,  Nosebleed cart at bedside.

## 2011-07-04 NOTE — Anesthesia Postprocedure Evaluation (Signed)
Anesthesia Post Note  Patient: [name]  Procedure(s) Performed:  NASAL ENDOSCOPY WITH EPISTAXIS CONTROL  Anesthesia type: General  Patient location: PACU  Post pain: Pain level controlled  Post assessment: Patient's Cardiovascular Status Stable  Last Vitals:  Filed Vitals:   07/04/11 1105  BP:   Pulse: 72  Temp:   Resp: 14    Post vital signs: Reviewed and stable  Level of consciousness: alert  Complications: No apparent anesthesia complications

## 2011-07-04 NOTE — ED Notes (Signed)
EMS called to home.  Found patient sitting in chair holding pressure to nose. Patient woke up and went to bathroom.  She blew her nose and noticed bleeding. BP 144/94  HR 90  RR18.  No complaints of pain

## 2011-07-04 NOTE — Anesthesia Procedure Notes (Signed)
Procedure Name: Intubation Date/Time: 07/04/2011 9:12 AM Performed by: Elon Alas Pre-anesthesia Checklist: Patient identified, Timeout performed, Emergency Drugs available, Suction available and Patient being monitored Patient Re-evaluated:Patient Re-evaluated prior to inductionOxygen Delivery Method: Circle System Utilized Preoxygenation: Pre-oxygenation with 100% oxygen Intubation Type: IV induction, Rapid sequence and Cricoid Pressure applied Laryngoscope Size: Mac and 3 Grade View: Grade I Tube type: Oral Tube size: 7.0 mm Number of attempts: 1 Airway Equipment and Method: stylet Placement Confirmation: ETT inserted through vocal cords under direct vision,  positive ETCO2 and breath sounds checked- equal and bilateral Secured at: 21 cm Tube secured with: Tape Dental Injury: Teeth and Oropharynx as per pre-operative assessment  Comments: Reverse trendelenburg.  Atraumatic placement.

## 2011-07-04 NOTE — ED Notes (Signed)
Pt taken to OR.

## 2011-07-04 NOTE — H&P (Signed)
[name] is an 76 y.o. female.   Chief Complaint: Rt epistaxis HPI: No prior Hx of epistaxis, daily ASA, hx of HTN and AAA. Acute bleeding this am. Seen and tx'ed in [facility] ER.  Past Medical History  Diagnosis Date  . Palpitations   . HTN (hypertension)   . Macular degeneration (senile) of retina, unspecified   . Nontoxic uninodular goiter   . Diffuse cystic mastopathy   . Anxiety   . Hyperlipidemia   . History of colonoscopy   . AAA (abdominal aortic aneurysm)     Past Surgical History  Procedure Date  . Total knee arthroplasty     Family History  Problem Relation Age of Onset  . Colon cancer Neg Hx   . Cancer Neg Hx   . Heart disease Neg Hx   . Coronary artery disease Other    Social History:  reports that she has never smoked. She does not have any smokeless tobacco history on file. She reports that she does not drink alcohol or use illicit drugs.  Allergies:  Allergies  Allergen Reactions  . Ibuprofen     REACTION: Blurry vision  . Rosuvastatin     REACTION: muscle aches    Medications Prior to Admission  Medication Dose Route Frequency Provider Last Rate Last Dose  . Ampicillin-Sulbactam (UNASYN) 3 g in sodium chloride 0.9 % 100 mL IVPB  3 g Intravenous Once April K Palumbo-Rasch, MD   3 g at 07/04/11 0721  . oxymetazoline (AFRIN) 0.05 % nasal spray 1 spray  1 spray Each Nare Once April K Palumbo-Rasch, MD   1 spray at 07/04/11 0449  . TDaP (BOOSTRIX) injection 0.5 mL  0.5 mL Intramuscular Once April K Palumbo-Rasch, MD   0.5 mL at 07/04/11 4098   Medications Prior to Admission  Medication Sig Dispense Refill  . aspirin 81 MG EC tablet Take 81 mg by mouth daily.        . clonazePAM (KLONOPIN) 0.5 MG tablet Take 1 tablet (0.5 mg total) by mouth 2 (two) times daily as needed. For anxiety   30 tablet  5  . hydrochlorothiazide (MICROZIDE) 12.5 MG capsule Take 1 capsule (12.5 mg total) by mouth 2 (two) times daily.  60 capsule  4  . Multiple Vitamins-Minerals  (MULTIVITAMIN,TX-MINERALS) tablet Take 1 tablet by mouth daily.        . nebivolol (BYSTOLIC) 10 MG tablet Take 1 tablet (10 mg total) by mouth 2 (two) times daily.  60 tablet  11  . potassium chloride SA (K-DUR,KLOR-CON) 20 MEQ tablet Take 1 tablet (20 mEq total) by mouth daily.  30 tablet  11  . pravastatin (PRAVACHOL) 10 MG tablet Take 1 tablet (10 mg total) by mouth every evening.  30 tablet  6    Results for orders placed during the hospital encounter of 07/04/11 (from the past 48 hour(s))  CBC     Status: Abnormal   Collection Time   07/04/11  5:05 AM      Component Value Range Comment   WBC 5.0  4.0 - 10.5 (K/uL)    RBC 3.41 (*) 3.87 - 5.11 (MIL/uL)    Hemoglobin 11.1 (*) 12.0 - 15.0 (g/dL)    HCT 11.9 (*) 14.7 - 46.0 (%)    MCV 92.4  78.0 - 100.0 (fL)    MCH 32.6  26.0 - 34.0 (pg)    MCHC 35.2  30.0 - 36.0 (g/dL)    RDW 82.9  56.2 - 13.0 (%)  Platelets 183  150 - 400 (K/uL)   DIFFERENTIAL     Status: Normal   Collection Time   07/04/11  5:05 AM      Component Value Range Comment   Neutrophils Relative 50  43 - 77 (%)    Neutro Abs 2.5  1.7 - 7.7 (K/uL)    Lymphocytes Relative 41  12 - 46 (%)    Lymphs Abs 2.1  0.7 - 4.0 (K/uL)    Monocytes Relative 5  3 - 12 (%)    Monocytes Absolute 0.3  0.1 - 1.0 (K/uL)    Eosinophils Relative 3  0 - 5 (%)    Eosinophils Absolute 0.2  0.0 - 0.7 (K/uL)    Basophils Relative 1  0 - 1 (%)    Basophils Absolute 0.0  0.0 - 0.1 (K/uL)   PROTIME-INR     Status: Normal   Collection Time   07/04/11  5:05 AM      Component Value Range Comment   Prothrombin Time 13.5  11.6 - 15.2 (seconds)    INR 1.01  0.00 - 1.49    POCT I-STAT, CHEM 8     Status: Abnormal   Collection Time   07/04/11  6:09 AM      Component Value Range Comment   Sodium 143  135 - 145 (mEq/Peterson)    Potassium 3.2 (*) 3.5 - 5.1 (mEq/Peterson)    Chloride 106  96 - 112 (mEq/Peterson)    BUN 14  6 - 23 (mg/dL)    Creatinine, Ser 4.09  0.50 - 1.10 (mg/dL)    Glucose, Bld 811 (*) 70 - 99  (mg/dL)    Calcium, Ion 9.14  1.12 - 1.32 (mmol/Peterson)    TCO2 25  0 - 100 (mmol/Peterson)    Hemoglobin 11.2 (*) 12.0 - 15.0 (g/dL)    HCT 78.2 (*) 95.6 - 46.0 (%)    No results found.  Review of Systems  Constitutional: Negative.   HENT: Positive for nosebleeds.   Respiratory: Negative.   Cardiovascular: Negative.   Musculoskeletal: Negative.   Skin: Negative.   Neurological: Negative.     Blood pressure 137/90, pulse 77, temperature 98.4 F (36.9 C), temperature source Oral, resp. rate 18, SpO2 97.00%. Physical Exam  Constitutional: She appears well-developed and well-nourished.  HENT:  Head: Normocephalic and atraumatic.  Right Ear: Hearing normal.  Left Ear: Hearing normal.  Nose: Epistaxis is observed.       Acute ant. & post. Epistaxis on Rt Packing inplace, min active bleeding  Neck: Normal range of motion. Neck supple.  Cardiovascular: Normal rate.   Respiratory: Effort normal and breath sounds normal.  GI: Soft.  Musculoskeletal: Normal range of motion.  Skin: Skin is warm.     Assessment/Plan Severe epistaxis, plan eval in OR with Endoscopic nasal cautery. Pt to PACU postop may d/c if stable.  [name] 07/04/2011, 9:01 AM

## 2011-07-04 NOTE — Preoperative (Signed)
Beta Blockers   Reason not to administer Beta Blockers:Not Applicable 

## 2011-07-04 NOTE — Anesthesia Preprocedure Evaluation (Addendum)
Anesthesia Evaluation  Patient identified by MRN, date of birth, ID band Patient awake    Reviewed: Allergy & Precautions, H&P , NPO status , Patient's Chart, lab work & pertinent test results, reviewed documented beta blocker date and time   Airway Mallampati: III TM Distance: >3 FB Neck ROM: Full    Dental  (+) Partial Upper and Dental Advisory Given   Pulmonary neg pulmonary ROS,          Cardiovascular hypertension, Pt. on home beta blockers neg cardio ROS + dysrhythmias Supra Ventricular Tachycardia  Hx thoracic aneurysm   Neuro/Psych PSYCHIATRIC DISORDERS Anxiety  Neuromuscular disease Negative Neurological ROS  Negative Psych ROS   GI/Hepatic negative GI ROS, Neg liver ROS, GERD-  Medicated,  Endo/Other  Negative Endocrine ROS  Renal/GU negative Renal ROS  Genitourinary negative   Musculoskeletal   Abdominal   Peds  Hematology negative hematology ROS (+)   Anesthesia Other Findings See surgeon's H&P   Reproductive/Obstetrics negative OB ROS                          Anesthesia Physical Anesthesia Plan  ASA: III and Emergent  Anesthesia Plan: General   Post-op Pain Management:    Induction: Intravenous, Rapid sequence and Cricoid pressure planned  Airway Management Planned: Oral ETT  Additional Equipment:   Intra-op Plan:   Post-operative Plan: Extubation in OR  Informed Consent: I have reviewed the patients History and Physical, chart, labs and discussed the procedure including the risks, benefits and alternatives for the proposed anesthesia with the patient or authorized representative who has indicated his/her understanding and acceptance.   Dental advisory given  Plan Discussed with: CRNA, Anesthesiologist and Surgeon  Anesthesia Plan Comments:        Anesthesia Quick Evaluation

## 2011-07-04 NOTE — Brief Op Note (Signed)
07/04/2011  10:19 AM  PATIENT:  [name]  76 y.o. female  PRE-OPERATIVE DIAGNOSIS:  epitaxis  POST-OPERATIVE DIAGNOSIS:  epitaxis  PROCEDURE:  Procedure(s): NASAL ENDOSCOPY WITH EPISTAXIS CONTROL  SURGEON:  Surgeon(s): Barbee Cough  PHYSICIAN ASSISTANT:   ASSISTANTS: none   ANESTHESIA:   general  EBL: < 50cc  BLOOD ADMINISTERED:none  DRAINS: none   LOCAL MEDICATIONS USED:  NONE  SPECIMEN:  No Specimen  DISPOSITION OF SPECIMEN:  N/A  COUNTS:  YES  TOURNIQUET:  * No tourniquets in log *  DICTATION: .Other Dictation: Dictation Number [id number]  PLAN OF CARE: Discharge to home after PACU  PATIENT DISPOSITION:  PACU - hemodynamically stable.   Delay start of Pharmacological VTE agent (>24hrs) due to surgical blood loss or risk of bleeding:  {YES/NO/NOT APPLICABLE:20182

## 2011-07-04 NOTE — ED Provider Notes (Signed)
  Physical Exam  BP 139/85  Pulse 93  Temp 97.7 F (36.5 C)  Resp 18  SpO2 94%  Physical Exam  Patient has packing in her nose some mild blood around.  ED Course  Procedures  MDM Patient was transferred from [facility] long to see a nose and throat. Vital signs appear good here. She is waiting to see Dr. Annalee Genta.      Juliet Rude. Rubin Payor, MD 07/04/11 [phone]

## 2011-07-05 NOTE — Op Note (Signed)
[name], [name] NO.:  [id number]  MEDICAL RECORD NO.:  [id number]  LOCATION:  [facility]                         FACILITY:  [facility]  PHYSICIAN:  Kinnie Scales. Annalee Genta, M.D.DATE OF BIRTH:  August 31, 1935  DATE OF PROCEDURE:  07/04/2011 DATE OF DISCHARGE:  07/04/2011                              OPERATIVE REPORT   LOCATION:  [facility] Main OR.  PREOPERATIVE DIAGNOSES:  Severe epistaxis  POSTOPERATIVE DIAGNOSES:  Severe epistaxis  INDICATION FOR SURGERY:  Severe epistaxis.  SURGICAL PROCEDURE:  Endoscopic nasal examination and control of epistaxis.  COMPLICATIONS:  None.  ESTIMATED BLOOD LOSS:  Less than 50 mL.  The patient was transferred from the operating room to the recovery room in stable condition.  BRIEF HISTORY:  The patient is a 76 year old black female with a previous history of hypertension and abdominal aortic aneurysm, no significant prior history of epistaxis, nosebleeds, or nasal trauma who developed acute severe epistaxis on the morning of July 04, 2011. She was taken to the [facility] Emergency Department by her family where she was managed by the emergency room physicians.  She required cautery and multiple nasal packs and continued to have significant epistaxis despite packing.  She was transferred to [facility] for evaluation with anticipated examination in the OR and cautery and control of epistaxis.  The patient has evaluated preoperatively.  She had packing in the right nostril and continued to have some anterior and posterior oozing.  Her hemoglobin, hematocrit, and blood pressure were all stable.  Given her history, examination, and findings, I recommended that we undertake endoscopic examination of the nasal passageway, removal of packing, and cautery of bleeding sites.  The risks, benefits, and possible complications of these procedures were discussed in detail with the patient and her family and they understood  and concurred with our plan for surgery which is scheduled on an emergency basis at [facility] Main OR.  PROCEDURE:  The patient was brought to the operating room on July 04, 2011, and placed in supine position on the operating table.  General endotracheal anesthesia was established without difficulty.  When the patient adequately anesthetized, she was positioned on the operating table and prepped and draped in a sterile fashion.  The patient was prepared for surgery.  Her right nasal packing was removed.  Nasal cavity was then thoroughly examined bilaterally.  There was a significant amount of clotted material in the superior and posterior aspect of the nasal cavity and nasopharynx.  These clots were completely removed.  Sinuses were inspected and again clotted material was removed from the sinuses bilaterally.  Along the distribution of the anterior and posterior ethmoid arteries on the medial surface of the middle and superior turbinate as well as the right superior nasal septum, there were prominent blood vessels which were actively bleeding at the time of our examination.  These were arterial in nature and had significant blood flow.  Under direct visualization, monopolar suction cautery was used to cauterize the each of the areas of epistaxis.  The nasal cavity was then thoroughly inspected and irrigated.  No further bleeding was encountered.  The patient's cautery sites were then treated with  Bactroban cream and Surgicel was placed in the superior meatus of the right nasal passageway.  Left side had no active bleeding and no apparently abnormal blood vessels.  The patient's nasal cavity was again irrigated and suctioned.  An orogastric tube was passed.  The stomach contents were aspirated.  The patient's oropharynx was cleared of any clotted material.  She was awakened from her anesthetic, extubated, and then transferred from the operating room to the recovery room in  stable condition.          ______________________________ Kinnie Scales Annalee Genta, M.D.     DLS/MEDQ  D:  13/01/6577  T:  07/05/2011  Job:  [id number]

## 2011-07-08 ENCOUNTER — Encounter (HOSPITAL_COMMUNITY): Payer: Self-pay | Admitting: *Deleted

## 2011-07-10 ENCOUNTER — Emergency Department (HOSPITAL_COMMUNITY)
Admission: EM | Admit: 2011-07-10 | Discharge: 2011-07-10 | Disposition: A | Discharge status: Home / Self Care | Payer: No Typology Code available for payment source | Attending: Emergency Medicine | Admitting: Emergency Medicine

## 2011-07-10 ENCOUNTER — Encounter (HOSPITAL_COMMUNITY): Payer: Self-pay | Admitting: *Deleted

## 2011-07-10 DIAGNOSIS — E785 Hyperlipidemia, unspecified: Secondary | ICD-10-CM | POA: Insufficient documentation

## 2011-07-10 DIAGNOSIS — I10 Essential (primary) hypertension: Secondary | ICD-10-CM | POA: Insufficient documentation

## 2011-07-10 DIAGNOSIS — Z79899 Other long term (current) drug therapy: Secondary | ICD-10-CM | POA: Insufficient documentation

## 2011-07-10 DIAGNOSIS — R040 Epistaxis: Secondary | ICD-10-CM | POA: Insufficient documentation

## 2011-07-10 MED ORDER — OXYMETAZOLINE HCL 0.05 % NA SOLN: 2.0000 | Freq: Two times a day (BID) | NASAL | Status: AC

## 2011-07-10 MED ORDER — OXYMETAZOLINE HCL 0.05 % NA SOLN
1.0000 | Freq: Once | NASAL | Status: AC
  Administered 2011-07-10: 1 via NASAL
  Filled 2011-07-10: qty 15

## 2011-07-10 NOTE — ED Provider Notes (Signed)
I saw and evaluated the patient, reviewed the resident's note and I agree with the findings and plan. 57 y female with hx of recurrent epistaxis c/o nose bleed tonight. She had nasal vessels cauterized last week by dr. Annalee Genta.  She denies pain, light headedness, sob. She is not on anticoagulants.  She says bleeding stopped now.  + clot in right nare. Blown out.  Bleeding stopped.  No bleeding from left nare.  Will observe and release if no rebleed.  Nicholes Stairs, MD 07/10/11 2202

## 2011-07-10 NOTE — ED Notes (Signed)
Dr Loretha Stapler at bedside assessing pt

## 2011-07-10 NOTE — ED Notes (Signed)
Pt ambulated to restroom, steady gait. Daughter assisted pt

## 2011-07-10 NOTE — ED Notes (Signed)
Pt visiting with family, no active nose bleeding at this time. Pt states she feels like she has a large clot in her nose. Notified Dr Loretha Stapler no new orders

## 2011-07-10 NOTE — ED Provider Notes (Signed)
History     CSN: [id number]  Arrival date & time 07/10/11  2004   First MD Initiated Contact with Patient 07/10/11 2027      Chief Complaint  Patient presents with  . Epistaxis    (Consider location/radiation/quality/duration/timing/severity/associated sxs/prior treatment) Patient is a 76 y.o. female presenting with nosebleeds.  Epistaxis  This is a recurrent problem. The current episode started 1 to 2 hours ago. The problem occurs constantly. The problem has been gradually improving. The problem is associated with an unknown (no trauma or nose picking) factor. The bleeding has been from both nares. She has tried applying pressure for the symptoms. The treatment provided moderate relief.    Past Medical History  Diagnosis Date  . Palpitations   . HTN (hypertension)   . Macular degeneration (senile) of retina, unspecified   . Nontoxic uninodular goiter   . Diffuse cystic mastopathy   . Anxiety   . Hyperlipidemia   . History of colonoscopy   . AAA (abdominal aortic aneurysm)     Past Surgical History  Procedure Date  . Total knee arthroplasty     Family History  Problem Relation Age of Onset  . Colon cancer Neg Hx   . Cancer Neg Hx   . Heart disease Neg Hx   . Coronary artery disease Other     History  Substance Use Topics  . Smoking status: Never Smoker   . Smokeless tobacco: Not on file  . Alcohol Use: No    OB History    Grav Para Term Preterm Abortions TAB SAB Ect Mult Living                  Review of Systems  Constitutional: Negative for fever.  HENT: Negative for congestion.   Respiratory: Negative for cough and shortness of breath.   Cardiovascular: Negative for chest pain.  Gastrointestinal: Negative for nausea, vomiting, abdominal pain and diarrhea.  Genitourinary: Negative for difficulty urinating.  All other systems reviewed and are negative.    Allergies  Ibuprofen and Rosuvastatin  Home Medications   Current Outpatient Rx  Name  Route Sig Dispense Refill  . AMOXICILLIN-POT CLAVULANATE 500-125 MG PO TABS Oral Take 1 tablet by mouth 2 (two) times daily.    Marland Kitchen CLONAZEPAM 0.5 MG PO TABS Oral Take 0.5 mg by mouth 2 (two) times daily as needed. For anxiety    . HYDROCHLOROTHIAZIDE 12.5 MG PO CAPS Oral Take 12.5 mg by mouth 2 (two) times daily.    . SUPER HIGH VITAMINS/MINERALS PO TABS Oral Take 1 tablet by mouth daily.      . NEBIVOLOL HCL 10 MG PO TABS Oral Take 10 mg by mouth 2 (two) times daily.    Marland Kitchen POTASSIUM CHLORIDE CRYS ER 20 MEQ PO TBCR Oral Take 20 mEq by mouth daily.    Marland Kitchen PRAVASTATIN SODIUM 10 MG PO TABS Oral Take 10 mg by mouth every evening.      BP 138/86  Pulse 88  Temp(Src) 98.1 F (36.7 C) (Other (Comment))  Resp 18  SpO2 100%  Physical Exam  Constitutional: She is oriented to person, place, and time. She appears well-developed and well-nourished. No distress.  HENT:  Head: Normocephalic and atraumatic.  Nose: Epistaxis is observed.  Mouth/Throat: Oropharynx is clear and moist.       Blood in bilateral nares R>L.  No visible lacerations. Unable to appreciate any active bleeding.  Small blood clots in right nare.    Eyes: Conjunctivae are normal.  Pupils are equal, round, and reactive to light. No scleral icterus.  Neck: Neck supple.  Cardiovascular: Normal rate, regular rhythm, normal heart sounds and intact distal pulses.   No murmur heard. Pulmonary/Chest: Effort normal and breath sounds normal. No stridor. No respiratory distress. She has no rales.  Abdominal: Soft. Bowel sounds are normal. She exhibits no distension. There is no tenderness.  Musculoskeletal: Normal range of motion.  Neurological: She is alert and oriented to person, place, and time.  Skin: Skin is warm and dry. No rash noted.  Psychiatric: She has a normal mood and affect. Her behavior is normal.    ED Course  Procedures (including critical care time)  Labs Reviewed - No data to display No results found.   1. Epistaxis        MDM  76 y.o. female with onset of epistaxis 1:45 minutes PTA.  Hx of recent epistaxis episode last week requiring operative cauterization.  Not anticoagulated.  Has not taken aspirin since last episode.  Bleeding currently controlled after holding pressure PTA.  Unable to appreciate active bleeding or laceration on nasal exam.  Vitals are stable and patient denies dizziness, syncope, shortness of breath, or chest pain.  She is well appearing.  Will give afrin.  Clot removed.  Monitored with no rebleeding.  Nasal exam at time of DC showed hemostasis and no additional clot.  DC'd home.        Warnell Forester, MD 07/10/11 [phone]

## 2011-07-10 NOTE — ED Notes (Signed)
The pt hashad a nose bleed for the past 1-2 hours.  She had a nose bleed last week and she had to have surgery to srop her bleeding

## 2011-07-11 NOTE — ED Provider Notes (Signed)
I saw and evaluated the patient, reviewed the resident's note and I agree with the findings and plan.  Nicholes Stairs, MD 07/11/11 0021

## 2011-07-20 ENCOUNTER — Ambulatory Visit: Payer: No Typology Code available for payment source | Admitting: Internal Medicine

## 2011-07-21 ENCOUNTER — Other Ambulatory Visit (INDEPENDENT_AMBULATORY_CARE_PROVIDER_SITE_OTHER): Payer: No Typology Code available for payment source

## 2011-07-21 ENCOUNTER — Ambulatory Visit: Payer: No Typology Code available for payment source | Admitting: Internal Medicine

## 2011-07-21 ENCOUNTER — Encounter: Payer: Self-pay | Admitting: Endocrinology

## 2011-07-21 ENCOUNTER — Ambulatory Visit (INDEPENDENT_AMBULATORY_CARE_PROVIDER_SITE_OTHER): Payer: No Typology Code available for payment source | Admitting: Endocrinology

## 2011-07-21 VITALS — BP 122/72 | HR 65 | Temp 98.1°F | Ht 66.0 in | Wt 162.0 lb

## 2011-07-21 DIAGNOSIS — D649 Anemia, unspecified: Secondary | ICD-10-CM

## 2011-07-21 LAB — CBC WITH DIFFERENTIAL/PLATELET
Basophils Absolute: 0 10*3/uL (ref 0.0–0.1)
Basophils Relative: 0.7 % (ref 0.0–3.0)
Eosinophils Absolute: 0.2 10*3/uL (ref 0.0–0.7)
Eosinophils Relative: 4 % (ref 0.0–5.0)
HCT: 28.9 % — ABNORMAL LOW (ref 36.0–46.0)
Hemoglobin: 9.9 g/dL — ABNORMAL LOW (ref 12.0–15.0)
Lymphocytes Relative: 44.6 % (ref 12.0–46.0)
Lymphs Abs: 2.2 10*3/uL (ref 0.7–4.0)
MCHC: 34.3 g/dL (ref 30.0–36.0)
MCV: 99.9 fl (ref 78.0–100.0)
Monocytes Absolute: 0.5 10*3/uL (ref 0.1–1.0)
Monocytes Relative: 9.5 % (ref 3.0–12.0)
Neutro Abs: 2.1 10*3/uL (ref 1.4–7.7)
Neutrophils Relative %: 41.2 % — ABNORMAL LOW (ref 43.0–77.0)
Platelets: 243 10*3/uL (ref 150.0–400.0)
RBC: 2.89 Mil/uL — ABNORMAL LOW (ref 3.87–5.11)
RDW: 14.4 % (ref 11.5–14.6)
WBC: 5 10*3/uL (ref 4.5–10.5)

## 2011-07-21 LAB — IBC PANEL
Iron: 50 ug/dL (ref 42–145)
Saturation Ratios: 18.7 % — ABNORMAL LOW (ref 20.0–50.0)
Transferrin: 191 mg/dL — ABNORMAL LOW (ref 212.0–360.0)

## 2011-07-21 NOTE — Progress Notes (Signed)
Subjective:    Patient ID: [name], female    DOB: 07-04-1935, 76 y.o.   MRN: [id number]  HPI Pt recently saw dr shoemaker for epistaxis.  This is improved but not resolved.  She takes fe 1/day.  She says bp is labile.   Pt states few years of intermittent vertigenous dizziness sensation in the head.  She had an episode last night.  Today, sxs are improved, but not resolved.  Past Medical History  Diagnosis Date  . Palpitations   . HTN (hypertension)   . Macular degeneration (senile) of retina, unspecified   . Nontoxic uninodular goiter   . Diffuse cystic mastopathy   . Anxiety   . Hyperlipidemia   . History of colonoscopy   . AAA (abdominal aortic aneurysm)     Past Surgical History  Procedure Date  . Total knee arthroplasty     History   Social History  . Marital Status: Divorced    Spouse Name: N/A    Number of Children: N/A  . Years of Education: N/A   Occupational History  . Not on file.   Social History Main Topics  . Smoking status: Never Smoker   . Smokeless tobacco: Not on file  . Alcohol Use: No  . Drug Use: No  . Sexually Active: No   Other Topics Concern  . Not on file   Social History Narrative   Regular Exercise -  NO    Current Outpatient Prescriptions on File Prior to Visit  Medication Sig Dispense Refill  . clonazePAM (KLONOPIN) 0.5 MG tablet Take 0.5 mg by mouth 2 (two) times daily as needed. For anxiety      . hydrochlorothiazide (MICROZIDE) 12.5 MG capsule Take 12.5 mg by mouth 2 (two) times daily.      . Multiple Vitamins-Minerals (MULTIVITAMIN,TX-MINERALS) tablet Take 1 tablet by mouth daily.        . nebivolol (BYSTOLIC) 10 MG tablet Take 10 mg by mouth 2 (two) times daily.      . potassium chloride SA (K-DUR,KLOR-CON) 20 MEQ tablet Take 20 mEq by mouth daily.        Allergies  Allergen Reactions  . Ibuprofen     REACTION: Blurry vision  . Rosuvastatin     REACTION: muscle aches    Family History  Problem Relation Age  of Onset  . Colon cancer Neg Hx   . Cancer Neg Hx   . Heart disease Neg Hx   . Coronary artery disease Other     BP 122/72  Pulse 65  Temp(Src) 98.1 F (36.7 C) (Oral)  Ht 5\' 6"  (1.676 m)  Wt 162 lb (73.483 kg)  BMI 26.15 kg/m2  SpO2 96%  Review of Systems She has myalgias--pt says due to pravachol.  Denies loc.      Objective:   Physical Exam VITAL SIGNS:  See vs page GENERAL: no distress head: no deformity eyes: no periorbital swelling, no proptosis external nose and ears are normal.   mouth: no lesion seen.   Both eac's and tm's are normal. MSK: no muscle tenderness.    Lab Results  Component Value Date   WBC 5.0 07/21/2011   HGB 9.9* 07/21/2011   HCT 28.9* 07/21/2011   MCV 99.9 07/21/2011   PLT 243.0 07/21/2011      Assessment & Plan:  HTN.  rx limited by lability fe-deficiency anemia, needs increased rx (it is caused or exac by epistaxis) Dizziness, prob due to anemia Dyslipidemia, therapy is  limited by perceived drug intolerance

## 2011-07-21 NOTE — Patient Instructions (Addendum)
Try stopping the pravastatin, on a trial basis. blood tests are being requested for you today.  please call [phone] to hear your test results.  You will be prompted to enter the 9-digit "MRN" number that appears at the top left of this page, followed by #.  Then you will hear the message. I hope you feel better soon.  If you don't feel better by next week, please call dr Yetta Barre.  (update: i left message on phone-tree:  Increase fe to 1-bid)

## 2011-07-30 ENCOUNTER — Encounter: Payer: Self-pay | Admitting: Internal Medicine

## 2011-07-30 ENCOUNTER — Other Ambulatory Visit (INDEPENDENT_AMBULATORY_CARE_PROVIDER_SITE_OTHER): Payer: No Typology Code available for payment source

## 2011-07-30 ENCOUNTER — Ambulatory Visit (INDEPENDENT_AMBULATORY_CARE_PROVIDER_SITE_OTHER): Payer: No Typology Code available for payment source | Admitting: Internal Medicine

## 2011-07-30 DIAGNOSIS — G729 Myopathy, unspecified: Secondary | ICD-10-CM

## 2011-07-30 DIAGNOSIS — I10 Essential (primary) hypertension: Secondary | ICD-10-CM

## 2011-07-30 DIAGNOSIS — R7309 Other abnormal glucose: Secondary | ICD-10-CM

## 2011-07-30 DIAGNOSIS — D649 Anemia, unspecified: Secondary | ICD-10-CM

## 2011-07-30 DIAGNOSIS — E785 Hyperlipidemia, unspecified: Secondary | ICD-10-CM

## 2011-07-30 DIAGNOSIS — E876 Hypokalemia: Secondary | ICD-10-CM | POA: Insufficient documentation

## 2011-07-30 DIAGNOSIS — Z79899 Other long term (current) drug therapy: Secondary | ICD-10-CM

## 2011-07-30 LAB — HEMOGLOBIN A1C: Hgb A1c MFr Bld: 5.4 % (ref 4.6–6.5)

## 2011-07-30 LAB — CBC WITH DIFFERENTIAL/PLATELET
Basophils Absolute: 0 10*3/uL (ref 0.0–0.1)
Basophils Relative: 0.7 % (ref 0.0–3.0)
Eosinophils Absolute: 0.3 10*3/uL (ref 0.0–0.7)
Eosinophils Relative: 5.3 % — ABNORMAL HIGH (ref 0.0–5.0)
HCT: 29.4 % — ABNORMAL LOW (ref 36.0–46.0)
Hemoglobin: 10.1 g/dL — ABNORMAL LOW (ref 12.0–15.0)
Lymphocytes Relative: 40 % (ref 12.0–46.0)
Lymphs Abs: 1.9 10*3/uL (ref 0.7–4.0)
MCHC: 34.4 g/dL (ref 30.0–36.0)
MCV: 99.5 fl (ref 78.0–100.0)
Monocytes Absolute: 0.4 10*3/uL (ref 0.1–1.0)
Monocytes Relative: 8.8 % (ref 3.0–12.0)
Neutro Abs: 2.1 10*3/uL (ref 1.4–7.7)
Neutrophils Relative %: 45.2 % (ref 43.0–77.0)
Platelets: 211 10*3/uL (ref 150.0–400.0)
RBC: 2.95 Mil/uL — ABNORMAL LOW (ref 3.87–5.11)
RDW: 14.5 % (ref 11.5–14.6)
WBC: 4.7 10*3/uL (ref 4.5–10.5)

## 2011-07-30 LAB — COMPREHENSIVE METABOLIC PANEL
ALT: 17 U/L (ref 0–35)
AST: 20 U/L (ref 0–37)
Albumin: 3.6 g/dL (ref 3.5–5.2)
Alkaline Phosphatase: 65 U/L (ref 39–117)
BUN: 13 mg/dL (ref 6–23)
CO2: 27 mEq/L (ref 19–32)
Calcium: 9.1 mg/dL (ref 8.4–10.5)
Chloride: 102 mEq/L (ref 96–112)
Creatinine, Ser: 0.9 mg/dL (ref 0.4–1.2)
GFR: 80.9 mL/min (ref >60.00)
Glucose, Bld: 100 mg/dL — ABNORMAL HIGH (ref 70–99)
Potassium: 3.4 mEq/L — ABNORMAL LOW (ref 3.5–5.1)
Sodium: 138 mEq/L (ref 135–145)
Total Bilirubin: 0.4 mg/dL (ref 0.3–1.2)
Total Protein: 7 g/dL (ref 6.0–8.3)

## 2011-07-30 LAB — IBC PANEL
Iron: 64 ug/dL (ref 42–145)
Saturation Ratios: 23.6 % (ref 20.0–50.0)
Transferrin: 194.1 mg/dL — ABNORMAL LOW (ref 212.0–360.0)

## 2011-07-30 LAB — VITAMIN B12: Vitamin B-12: 1435 pg/mL — ABNORMAL HIGH (ref 211–911)

## 2011-07-30 LAB — FOLATE: Folate: 22.6 ng/mL

## 2011-07-30 LAB — FERRITIN: Ferritin: 38.9 ng/mL (ref 10.0–291.0)

## 2011-07-30 LAB — CK: Total CK: 112 U/L (ref 7–177)

## 2011-07-30 LAB — SEDIMENTATION RATE: Sed Rate: 42 mm/h — ABNORMAL HIGH (ref 0–22)

## 2011-07-30 LAB — MAGNESIUM: Magnesium: 1.9 mg/dL (ref 1.5–2.5)

## 2011-07-30 MED ORDER — KETOROLAC TROMETHAMINE 60 MG/2ML IM SOLN
60.0000 mg | Freq: Once | INTRAMUSCULAR | Status: AC
  Administered 2011-07-30: 60 mg via INTRAMUSCULAR

## 2011-07-30 NOTE — Assessment & Plan Note (Signed)
She has stopped the statin, today I will check her CPK level and will look for other causes, also we have given her an injection of toradol for the muscle aches

## 2011-07-30 NOTE — Assessment & Plan Note (Signed)
Her BP is well controlled 

## 2011-07-30 NOTE — Assessment & Plan Note (Signed)
I will recheck her K+ level and will check her Mg++ level as well 

## 2011-07-30 NOTE — Assessment & Plan Note (Signed)
She has had myalgias with crestor so she will not take a statin

## 2011-07-30 NOTE — Assessment & Plan Note (Signed)
For an a1c level today to screen for DM II

## 2011-07-30 NOTE — Assessment & Plan Note (Signed)
Recheck CBC and will look at vitamin levels as well

## 2011-07-30 NOTE — Progress Notes (Signed)
Subjective:    Patient ID: [name], female    DOB: [birth year], 76 y.o.   MRN: [id number]  Hypertension This is a chronic problem. The current episode started more than 1 year ago. The problem has been gradually improving since onset. The problem is controlled. Associated symptoms include anxiety. Pertinent negatives include no blurred vision, chest pain, headaches, malaise/fatigue, neck pain, orthopnea, palpitations, peripheral edema, PND, shortness of breath or sweats. There are no associated agents to hypertension. Past treatments include diuretics and beta blockers. The current treatment provides significant improvement. Compliance problems include exercise and diet.       Review of Systems  Constitutional: Negative for fever, chills, malaise/fatigue, diaphoresis, activity change, appetite change, fatigue and unexpected weight change.  HENT: Negative.  Negative for neck pain.   Eyes: Negative.  Negative for blurred vision.  Respiratory: Negative for apnea, cough, choking, chest tightness, shortness of breath, wheezing and stridor.   Cardiovascular: Negative for chest pain, palpitations, orthopnea, leg swelling and PND.  Gastrointestinal: Negative for nausea, vomiting, abdominal pain, diarrhea, constipation, blood in stool, abdominal distention, anal bleeding and rectal pain.  Genitourinary: Negative.   Musculoskeletal: Positive for myalgias. Negative for back pain, joint swelling, arthralgias and gait problem.  Skin: Negative for color change, pallor, rash and wound.  Neurological: Negative.  Negative for headaches.  Hematological: Negative for adenopathy. Does not bruise/bleed easily.  Psychiatric/Behavioral: Negative.        Objective:   Physical Exam  Vitals reviewed. Constitutional: She is oriented to person, place, and time. She appears well-developed and well-nourished. No distress.  HENT:  Head: Normocephalic and atraumatic.  Mouth/Throat: Oropharynx is clear and  moist. No oropharyngeal exudate.  Eyes: Conjunctivae are normal. Right eye exhibits no discharge. Left eye exhibits no discharge. No scleral icterus.  Neck: Normal range of motion. Neck supple. No JVD present. No tracheal deviation present. No thyromegaly present.  Cardiovascular: Normal rate, regular rhythm, normal heart sounds and intact distal pulses.  Exam reveals no gallop and no friction rub.   No murmur heard. Pulmonary/Chest: Effort normal and breath sounds normal. No stridor. No respiratory distress. She has no wheezes. She has no rales. She exhibits no tenderness.  Abdominal: Soft. Bowel sounds are normal. She exhibits no distension and no mass. There is no tenderness. There is no rebound and no guarding.  Musculoskeletal: Normal range of motion. She exhibits no edema and no tenderness.  Lymphadenopathy:    She has no cervical adenopathy.  Neurological: She is oriented to person, place, and time.  Skin: Skin is warm and dry. No rash noted. She is not diaphoretic. No erythema. No pallor.  Psychiatric: She has a normal mood and affect. Her behavior is normal. Judgment and thought content normal.      Lab Results  Component Value Date   WBC 5.0 07/21/2011   HGB 9.9* 07/21/2011   HCT 28.9* 07/21/2011   PLT 243.0 07/21/2011   GLUCOSE 115* 07/04/2011   CHOL 178 06/24/2011   TRIG 85.0 06/24/2011   HDL 70.20 06/24/2011   LDLDIRECT 139.2 04/15/2011   LDLCALC 91 06/24/2011   ALT 13 12/04/2010   AST 17 06/24/2011   NA 143 07/04/2011   K 3.2* 07/04/2011   CL 106 07/04/2011   CREATININE 0.80 07/04/2011   BUN 14 07/04/2011   CO2 28 12/04/2010   TSH 0.40 05/30/2010   INR 1.01 07/04/2011   HGBA1C  Value: 5.7 (NOTE)  According to the ADA Clinical Practice Recommendations for 2011, when HbA1c is used as a screening test:   >=6.5%   Diagnostic of Diabetes Mellitus           (if abnormal result  is confirmed)  5.7-6.4%   Increased risk of  developing Diabetes Mellitus  References:Diagnosis and Classification of Diabetes Mellitus,Diabetes Care,2011,34(Suppl 1):S62-S69 and Standards of Medical Care in         Diabetes - 2011,Diabetes Care,2011,34  (Suppl 1):S11-S61.* 03/18/2010      Assessment & Plan:

## 2011-07-30 NOTE — Patient Instructions (Signed)
Anemia, Nonspecific Your exam and blood tests show you are anemic. This means your blood (hemoglobin) level is low. Normal hemoglobin values are 12 to 15 g/dL for females and 14 to 17 g/dL for males. Make a note of your hemoglobin level today. The hematocrit percent is also used to measure anemia. A normal hematocrit is 38% to 46% in females and 42% to 49% in males. Make a note of your hematocrit level today. CAUSES  Anemia can be due to many different causes.  Excessive bleeding from periods (in women).   Intestinal bleeding.   Poor nutrition.   Kidney, thyroid, liver, and bone marrow diseases.  SYMPTOMS  Anemia can come on suddenly (acute). It can also come on slowly. Symptoms can include:  Minor weakness.   Dizziness.   Palpitations.   Shortness of breath.  Symptoms may be absent until half your hemoglobin is missing if it comes on slowly. Anemia due to acute blood loss from an injury or internal bleeding may require blood transfusion if the loss is severe. Hospital care is needed if you are anemic and there is significant continual blood loss. TREATMENT   Stool tests for blood (Hemoccult) and additional lab tests are often needed. This determines the best treatment.   Further checking on your condition and your response to treatment is very important. It often takes many weeks to correct anemia.  Depending on the cause, treatment can include:  Supplements of iron.   Vitamins B12 and folic acid.   Hormone medicines.If your anemia is due to bleeding, finding the cause of the blood loss is very important. This will help avoid further problems.  SEEK IMMEDIATE MEDICAL CARE IF:   You develop fainting, extreme weakness, shortness of breath, or chest pain.   You develop heavy vaginal bleeding.   You develop bloody or black, tarry stools or vomit up blood.   You develop a high fever, rash, repeated vomiting, or dehydration.  Document Released: 07/16/2004 Document Revised:  02/18/2011 Document Reviewed: 04/23/2009 Exit[employer] Patient Information 2012 [employer]. 

## 2011-08-05 ENCOUNTER — Other Ambulatory Visit: Payer: Self-pay | Admitting: Internal Medicine

## 2011-08-05 DIAGNOSIS — Z1231 Encounter for screening mammogram for malignant neoplasm of breast: Secondary | ICD-10-CM

## 2011-08-06 ENCOUNTER — Other Ambulatory Visit: Payer: Self-pay | Admitting: Internal Medicine

## 2011-08-10 ENCOUNTER — Ambulatory Visit (INDEPENDENT_AMBULATORY_CARE_PROVIDER_SITE_OTHER): Payer: No Typology Code available for payment source | Admitting: Internal Medicine

## 2011-08-10 ENCOUNTER — Encounter: Payer: Self-pay | Admitting: Internal Medicine

## 2011-08-10 DIAGNOSIS — D649 Anemia, unspecified: Secondary | ICD-10-CM

## 2011-08-10 DIAGNOSIS — R040 Epistaxis: Secondary | ICD-10-CM

## 2011-08-10 DIAGNOSIS — I10 Essential (primary) hypertension: Secondary | ICD-10-CM

## 2011-08-10 DIAGNOSIS — E785 Hyperlipidemia, unspecified: Secondary | ICD-10-CM

## 2011-08-10 LAB — BASIC METABOLIC PANEL
BUN: 13 mg/dL (ref 6–23)
CO2: 27 mEq/L (ref 19–32)
Calcium: 9.8 mg/dL (ref 8.4–10.5)
Chloride: 104 mEq/L (ref 96–112)
Creatinine, Ser: 0.8 mg/dL (ref 0.4–1.2)
GFR: 91.62 mL/min (ref >60.00)
Glucose, Bld: 91 mg/dL (ref 70–99)
Potassium: 3.7 mEq/L (ref 3.5–5.1)
Sodium: 139 mEq/L (ref 135–145)

## 2011-08-10 LAB — SEDIMENTATION RATE: Sed Rate: 37 mm/hr — ABNORMAL HIGH (ref 0–22)

## 2011-08-10 MED ORDER — MULTIGEN 70 MG PO TABS: 1.0000 | ORAL_TABLET | Freq: Every day | ORAL | Status: DC

## 2011-08-10 NOTE — Assessment & Plan Note (Signed)
Adequate control  No orthostasis on exam.

## 2011-08-10 NOTE — Assessment & Plan Note (Signed)
Has f/u in ENT next wk.  Told her to use the saline nasal spray.  Also, should begin Fe supplement.

## 2011-08-10 NOTE — Progress Notes (Signed)
HPI Patient is a 76 year old with a history of HTN, dyslipidemia, small thoracic aneurysm.  I saw her back in August. Lipids in January LDL was 91, HDL ws 64. In January had 3 nose bleeds.  Treated by Dr. Annalee Genta. Since then has been very dizzy.  No syncope but close. Dizziness comes on with bending.  Even with standing has had fading sensation.  No syncope. She denies weakness, numbness elsewhere.  No CP.  She has not started nasal saline spray.  She has also not started Fe supplements for mild anemia.  Note ESR mildly increased. Has appt in ENT next wk. Allergies  Allergen Reactions  . Ibuprofen     REACTION: Blurry vision  . Rosuvastatin     REACTION: muscle aches  . Statins     Current Outpatient Prescriptions  Medication Sig Dispense Refill  . Cholecalciferol (VITAMIN D PO) Take by mouth 2 (two) times daily.      . clonazePAM (KLONOPIN) 0.5 MG tablet Take 0.5 mg by mouth 2 (two) times daily as needed. For anxiety      . hydrochlorothiazide (MICROZIDE) 12.5 MG capsule Take 12.5 mg by mouth 2 (two) times daily.      . meclizine (ANTIVERT) 25 MG tablet TAKE 1 TABLET BY MOUTH 3 TIMES A DAY AS NEEDED VERTIGO  50 tablet  0  . Multiple Vitamins-Minerals (MULTIVITAMIN,TX-MINERALS) tablet Take 1 tablet by mouth daily.        . nebivolol (BYSTOLIC) 10 MG tablet Take 10 mg by mouth 2 (two) times daily.      . potassium chloride SA (K-DUR,KLOR-CON) 20 MEQ tablet Take 20 mEq by mouth daily.        Past Medical History  Diagnosis Date  . Palpitations   . HTN (hypertension)   . Macular degeneration (senile) of retina, unspecified   . Nontoxic uninodular goiter   . Diffuse cystic mastopathy   . Anxiety   . Hyperlipidemia   . History of colonoscopy   . AAA (abdominal aortic aneurysm)     Past Surgical History  Procedure Date  . Total knee arthroplasty     Family History  Problem Relation Age of Onset  . Colon cancer Neg Hx   . Cancer Neg Hx   . Heart disease Neg Hx   .  Coronary artery disease Other     History   Social History  . Marital Status: Divorced    Spouse Name: N/A    Number of Children: N/A  . Years of Education: N/A   Occupational History  . Not on file.   Social History Main Topics  . Smoking status: Never Smoker   . Smokeless tobacco: Not on file  . Alcohol Use: No  . Drug Use: No  . Sexually Active: No   Other Topics Concern  . Not on file   Social History Narrative   Regular Exercise -  NO    Review of Systems:  All systems reviewed.  They are negative to the above problem except as previously stated.  Vital Signs: BP 110/82  Pulse 72  Ht 5\' 6"  (1.676 m)  Wt 160 lb (72.576 kg)  BMI 25.82 kg/m2  Physical Exam  Patient is in NAD  HEENT:  Normocephalic, atraumatic. EOMI, PERRLA.  Neck: JVP is normal. No thyromegaly. No bruits.  Lungs: clear to auscultation. No rales no wheezes.  Heart: Regular rate and rhythm. Normal S1, S2. No S3.   No significant murmurs. PMI not displaced.  Abdomen:  Supple, nontender. Normal bowel sounds. No masses. No hepatomegaly.  Extremities:   Good distal pulses throughout. No lower extremity edema.  Musculoskeletal :moving all extremities.  Neuro:   alert and oriented x3.  CN II-XII grossly intact.   Assessment and Plan:

## 2011-08-10 NOTE — Assessment & Plan Note (Signed)
Need to review.  Not on any agents.

## 2011-08-10 NOTE — Patient Instructions (Signed)
Lab work today We will call you wit results.  Your physician wants you to follow-up in:NOVEMBER 2013 You will receive a reminder letter in the mail two months in advance. If you don't receive a letter, please call our office to schedule the follow-up appointment.

## 2011-09-23 ENCOUNTER — Other Ambulatory Visit: Payer: Self-pay | Admitting: Internal Medicine

## 2011-09-24 ENCOUNTER — Telehealth: Payer: Self-pay

## 2011-09-24 NOTE — Telephone Encounter (Signed)
Received refill request for clonazepam, please advise if ok to refill

## 2011-09-25 MED ORDER — CLONAZEPAM 0.5 MG PO TABS: 0.5000 mg | ORAL_TABLET | Freq: Two times a day (BID) | ORAL | Status: DC | PRN

## 2011-09-25 NOTE — Telephone Encounter (Signed)
yes

## 2011-09-28 ENCOUNTER — Other Ambulatory Visit: Payer: Self-pay | Admitting: Internal Medicine

## 2011-10-27 ENCOUNTER — Other Ambulatory Visit: Payer: Self-pay | Admitting: Internal Medicine

## 2011-11-17 ENCOUNTER — Ambulatory Visit: Payer: No Typology Code available for payment source | Admitting: Internal Medicine

## 2011-12-14 ENCOUNTER — Encounter: Payer: Self-pay | Admitting: Internal Medicine

## 2011-12-14 ENCOUNTER — Ambulatory Visit (INDEPENDENT_AMBULATORY_CARE_PROVIDER_SITE_OTHER): Payer: No Typology Code available for payment source | Admitting: Internal Medicine

## 2011-12-14 VITALS — BP 122/78 | HR 67 | Temp 98.4°F | Resp 16 | Wt 162.0 lb

## 2011-12-14 DIAGNOSIS — M1710 Unilateral primary osteoarthritis, unspecified knee: Secondary | ICD-10-CM | POA: Insufficient documentation

## 2011-12-14 DIAGNOSIS — I10 Essential (primary) hypertension: Secondary | ICD-10-CM

## 2011-12-14 DIAGNOSIS — IMO0002 Reserved for concepts with insufficient information to code with codable children: Secondary | ICD-10-CM

## 2011-12-14 DIAGNOSIS — E041 Nontoxic single thyroid nodule: Secondary | ICD-10-CM

## 2011-12-14 MED ORDER — TRAMADOL HCL 50 MG PO TABS: 50.0000 mg | ORAL_TABLET | Freq: Three times a day (TID) | ORAL | Status: AC | PRN

## 2011-12-14 NOTE — Assessment & Plan Note (Signed)
I will recheck her T u/s to see if anything has developed suspicious elements

## 2011-12-14 NOTE — Assessment & Plan Note (Signed)
Her BP is well controlled 

## 2011-12-14 NOTE — Progress Notes (Signed)
Subjective:    Patient ID: [name], female    DOB: January 15, 1936, 76 y.o.   MRN: [id number]  Arthritis Presents for follow-up visit. She complains of pain. She reports no stiffness, joint swelling or joint warmth. Affected locations include the left elbow, right hip, right elbow and left hip. Her pain is at a severity of 4/10. Associated symptoms include fatigue. Pertinent negatives include no diarrhea, dysuria, fever, pain at night, pain while resting, rash or weight loss. Compliance with total regimen is 26-50%. Compliance problems include psychosocial issues.  Compliance with medications is 26-50%.      Review of Systems  Constitutional: Positive for fatigue. Negative for fever, chills, weight loss, diaphoresis, activity change, appetite change and unexpected weight change.  HENT: Positive for voice change (chronic laryngitis). Negative for hearing loss, ear pain, nosebleeds, congestion, sore throat, facial swelling, rhinorrhea, sneezing, drooling, mouth sores, trouble swallowing, neck pain, neck stiffness, dental problem, postnasal drip, sinus pressure, tinnitus and ear discharge.   Eyes: Negative.   Respiratory: Negative for cough, chest tightness, shortness of breath, wheezing and stridor.   Cardiovascular: Negative for chest pain, palpitations and leg swelling.  Gastrointestinal: Negative for nausea, vomiting, abdominal pain, diarrhea, constipation, blood in stool and abdominal distention.  Genitourinary: Negative.  Negative for dysuria.  Musculoskeletal: Positive for arthritis. Negative for myalgias, back pain, joint swelling, arthralgias, gait problem and stiffness.  Skin: Negative for color change, pallor, rash and wound.  Neurological: Negative for dizziness, tremors, seizures, syncope, facial asymmetry, speech difficulty, weakness, light-headedness, numbness and headaches.  Hematological: Negative for adenopathy. Does not bruise/bleed easily.  Psychiatric/Behavioral: Negative.          Objective:   Physical Exam  Vitals reviewed. Constitutional: She is oriented to person, place, and time. She appears well-developed and well-nourished. No distress.  HENT:  Head: Normocephalic and atraumatic.  Mouth/Throat: No oropharyngeal exudate.  Eyes: Conjunctivae are normal. Right eye exhibits no discharge. Left eye exhibits no discharge. No scleral icterus.  Neck: Normal range of motion. Neck supple. No JVD present. No tracheal tenderness present. No tracheal deviation present. No mass and no thyromegaly present.  Cardiovascular: Normal rate, regular rhythm, normal heart sounds and intact distal pulses.  Exam reveals no gallop and no friction rub.   No murmur heard. Pulmonary/Chest: Effort normal and breath sounds normal. No stridor. No respiratory distress. She has no wheezes. She has no rales. She exhibits no tenderness.  Abdominal: Soft. Bowel sounds are normal. She exhibits no distension and no mass. There is no tenderness. There is no rebound and no guarding.  Musculoskeletal: Normal range of motion. She exhibits no edema and no tenderness.       Right elbow: Normal.      Left elbow: Normal.       Right knee: Normal.       Left knee: Normal.  Lymphadenopathy:    She has no cervical adenopathy.  Neurological: She is oriented to person, place, and time.  Skin: Skin is warm and dry. No rash noted. She is not diaphoretic. No erythema. No pallor.  Psychiatric: She has a normal mood and affect. Her behavior is normal. Judgment and thought content normal.     Lab Results  Component Value Date   WBC 4.7 07/30/2011   HGB 10.1* 07/30/2011   HCT 29.4* 07/30/2011   PLT 211.0 07/30/2011   GLUCOSE 91 08/10/2011   CHOL 178 06/24/2011   TRIG 85.0 06/24/2011   HDL 70.20 06/24/2011   LDLDIRECT 139.2 04/15/2011  LDLCALC 91 06/24/2011   ALT 17 07/30/2011   AST 20 07/30/2011   NA 139 08/10/2011   K 3.7 08/10/2011   CL 104 08/10/2011   CREATININE 0.8 08/10/2011   BUN 13 08/10/2011   CO2 27  08/10/2011   TSH 0.40 05/30/2010   INR 1.01 07/04/2011   HGBA1C 5.4 07/30/2011       Assessment & Plan:

## 2011-12-14 NOTE — Patient Instructions (Addendum)
Degenerative Arthritis  You have osteoarthritis. This is the wear and tear arthritis that comes with aging. It is also called degenerative arthritis. This is common in people past middle age. It is caused by stress on the joints. The large weight bearing joints of the lower extremities are most often affected. The knees, hips, back, neck, and hands can become painful, swollen, and stiff. This is the most common type of arthritis. It comes on with age, carrying too much weight, or from an injury.  Treatment includes resting the sore joint until the pain and swelling improve. Crutches or a walker may be needed for severe flares. Only take over-the-counter or prescription medicines for pain, discomfort, or fever as directed by your caregiver. Local heat therapy may improve motion. Cortisone shots into the joint are sometimes used to reduce pain and swelling during flares.  Osteoarthritis is usually not crippling and progresses slowly. There are things you can do to decrease pain:  · Avoid high impact activities.  · Exercise regularly.  · Low impact exercises such as walking, biking and swimming help to keep the muscles strong and keep normal joint function.  · Stretching helps to keep your range of motion.  · Lose weight if you are overweight. This reduces joint stress.  In severe cases when you have pain at rest or increasing disability, joint surgery may be helpful. See your caregiver for follow-up treatment as recommended.   SEEK IMMEDIATE MEDICAL CARE IF:   · You have severe joint pain.  · Marked swelling and redness in your joint develops.  · You develop a high fever.  Document Released: 06/08/2005 Document Revised: 05/28/2011 Document Reviewed: 11/08/2006  Exit[employer]® Patient Information ©2012 [employer].

## 2011-12-14 NOTE — Assessment & Plan Note (Signed)
She will try tramadol for pain

## 2011-12-16 ENCOUNTER — Ambulatory Visit: Payer: No Typology Code available for payment source

## 2011-12-26 ENCOUNTER — Other Ambulatory Visit: Payer: Self-pay | Admitting: Internal Medicine

## 2011-12-28 ENCOUNTER — Other Ambulatory Visit: Payer: Self-pay | Admitting: Internal Medicine

## 2011-12-28 ENCOUNTER — Ambulatory Visit
Admission: RE | Admit: 2011-12-28 | Discharge: 2011-12-28 | Disposition: A | Discharge status: Home / Self Care | Payer: No Typology Code available for payment source | Source: Ambulatory Visit | Attending: Internal Medicine | Admitting: Internal Medicine

## 2011-12-28 DIAGNOSIS — E041 Nontoxic single thyroid nodule: Secondary | ICD-10-CM

## 2011-12-31 ENCOUNTER — Ambulatory Visit (INDEPENDENT_AMBULATORY_CARE_PROVIDER_SITE_OTHER): Payer: No Typology Code available for payment source | Admitting: Internal Medicine

## 2011-12-31 ENCOUNTER — Encounter: Payer: Self-pay | Admitting: Internal Medicine

## 2011-12-31 VITALS — BP 136/76 | HR 74 | Ht 66.0 in | Wt 160.0 lb

## 2011-12-31 DIAGNOSIS — E782 Mixed hyperlipidemia: Secondary | ICD-10-CM

## 2011-12-31 DIAGNOSIS — I498 Other specified cardiac arrhythmias: Secondary | ICD-10-CM

## 2011-12-31 DIAGNOSIS — I471 Supraventricular tachycardia: Secondary | ICD-10-CM

## 2011-12-31 NOTE — Progress Notes (Signed)
HPI Patient is a 76 year old with a history of HTN, hyperlipidemia, small thoracic aneurysm.  I saw her in clinic in August of last year. Since seen she says her breathing is OK.  She denies CP.  Remains active.  She is very anxious   Asks if her heart is OK  Being evaluated in ENT for thyroid nodule.  May have removed. Allergies  Allergen Reactions  . Ibuprofen     REACTION: Blurry vision  . Rosuvastatin     REACTION: muscle aches  . Statins     Current Outpatient Prescriptions  Medication Sig Dispense Refill  . BYSTOLIC 10 MG tablet TAKE 1 TABLET BY MOUTH TWICE A DAY  60 tablet  7  . Cholecalciferol (VITAMIN D PO) Take by mouth 2 (two) times daily.      . clonazePAM (KLONOPIN) 0.5 MG tablet Take 1 tablet (0.5 mg total) by mouth 2 (two) times daily as needed. For anxiety  60 tablet  5  . hydrochlorothiazide (MICROZIDE) 12.5 MG capsule TAKE 1 CAPSULE (12.5 MG TOTAL) BY MOUTH 2 (TWO) TIMES DAILY.  60 capsule  3  . KLOR-CON M20 20 MEQ tablet TAKE 1 TABLET BY MOUTH EVERY DAY  30 tablet  2  . meclizine (ANTIVERT) 25 MG tablet TAKE 1 TABLET BY MOUTH 3 TIMES A DAY AS NEEDED VERTIGO  50 tablet  0  . mineral/vitamin supplement (MULTIGEN) 70 MG TABS Take 1 tablet (70 mg total) by mouth daily.  30 each  6  . Multiple Vitamins-Minerals (MULTIVITAMIN,TX-MINERALS) tablet Take 1 tablet by mouth daily.          Past Medical History  Diagnosis Date  . Palpitations   . HTN (hypertension)   . Macular degeneration (senile) of retina, unspecified   . Nontoxic uninodular goiter   . Diffuse cystic mastopathy   . Anxiety   . Hyperlipidemia   . History of colonoscopy   . AAA (abdominal aortic aneurysm)     Past Surgical History  Procedure Date  . Total knee arthroplasty     Family History  Problem Relation Age of Onset  . Colon cancer Neg Hx   . Cancer Neg Hx   . Heart disease Neg Hx   . Coronary artery disease Other     History   Social History  . Marital Status: Divorced    Spouse  Name: N/A    Number of Children: N/A  . Years of Education: N/A   Occupational History  . Not on file.   Social History Main Topics  . Smoking status: Never Smoker   . Smokeless tobacco: Not on file  . Alcohol Use: No  . Drug Use: No  . Sexually Active: No   Other Topics Concern  . Not on file   Social History Narrative   Regular Exercise -  NO    Review of Systems:  All systems reviewed.  They are negative to the above problem except as previously stated.  Vital Signs: BP 136/76  Pulse 74  Ht 5\' 6"  (1.676 m)  Wt 160 lb (72.576 kg)  BMI 25.82 kg/m2  Physical Exam Patient is in NAD HEENT:  Normocephalic, atraumatic. EOMI, PERRLA.  Neck: JVP is normal. . No bruits.  Lungs: clear to auscultation. No rales no wheezes.  Heart: Regular rate and rhythm. Normal S1, S2. No S3.   No significant murmurs. PMI not displaced.  Abdomen:  Supple, nontender. Normal bowel sounds. No masses. No hepatomegaly.  Extremities:  Good distal pulses throughout. No lower extremity edema.  Musculoskeletal :moving all extremities.  Neuro:   alert and oriented x3.  CN II-XII grossly intact.  EKG:  SR.  69.  First degree AV block.  LVH.   Assessment and Plan:  1.  HTN  Adquate control.  Keep on same regmien.  2l  HL  Ate.  Will set up for fasting lipids.  3.  Aortic aneurysm.  Last CT of chest in March 2012 showed 4.3 cm aneurysm.  Stable for 2011.  WIll follow.  Patient would be a low risk from cardiac standpoint if needs surgery on neck.  F/u in Winter 2014.

## 2011-12-31 NOTE — Patient Instructions (Signed)
Your physician wants you to follow-up in: January 2014 You will receive a reminder letter in the mail two months in advance. If you don't receive a letter, please call our office to schedule the follow-up appointment.   [facility] for fasting blood work.

## 2012-01-04 NOTE — Progress Notes (Signed)
Quick Note:  Patient notified/LMOVM ______

## 2012-01-05 ENCOUNTER — Other Ambulatory Visit: Payer: No Typology Code available for payment source

## 2012-01-07 ENCOUNTER — Ambulatory Visit
Admission: RE | Admit: 2012-01-07 | Discharge: 2012-01-07 | Disposition: A | Discharge status: Home / Self Care | Payer: No Typology Code available for payment source | Source: Ambulatory Visit | Attending: Internal Medicine | Admitting: Internal Medicine

## 2012-01-07 ENCOUNTER — Other Ambulatory Visit: Payer: Self-pay | Admitting: Internal Medicine

## 2012-01-07 DIAGNOSIS — N644 Mastodynia: Secondary | ICD-10-CM

## 2012-01-07 DIAGNOSIS — Z1231 Encounter for screening mammogram for malignant neoplasm of breast: Secondary | ICD-10-CM

## 2012-01-14 ENCOUNTER — Other Ambulatory Visit: Payer: Self-pay | Admitting: Internal Medicine

## 2012-01-18 ENCOUNTER — Ambulatory Visit
Admission: RE | Admit: 2012-01-18 | Discharge: 2012-01-18 | Disposition: A | Discharge status: Home / Self Care | Payer: No Typology Code available for payment source | Source: Ambulatory Visit | Attending: Internal Medicine | Admitting: Internal Medicine

## 2012-01-18 DIAGNOSIS — N644 Mastodynia: Secondary | ICD-10-CM

## 2012-01-18 LAB — HM MAMMOGRAPHY: HM Mammogram: NORMAL

## 2012-01-24 IMAGING — CR DG FOOT COMPLETE 3+V*L*
3 series · 3 of 3 positions shown · non-contrast
Comparison: None.

CLINICAL DATA: Foot pain.

LEFT FOOT - COMPLETE 3+ VIEW

[view not recorded (1 of 3)]
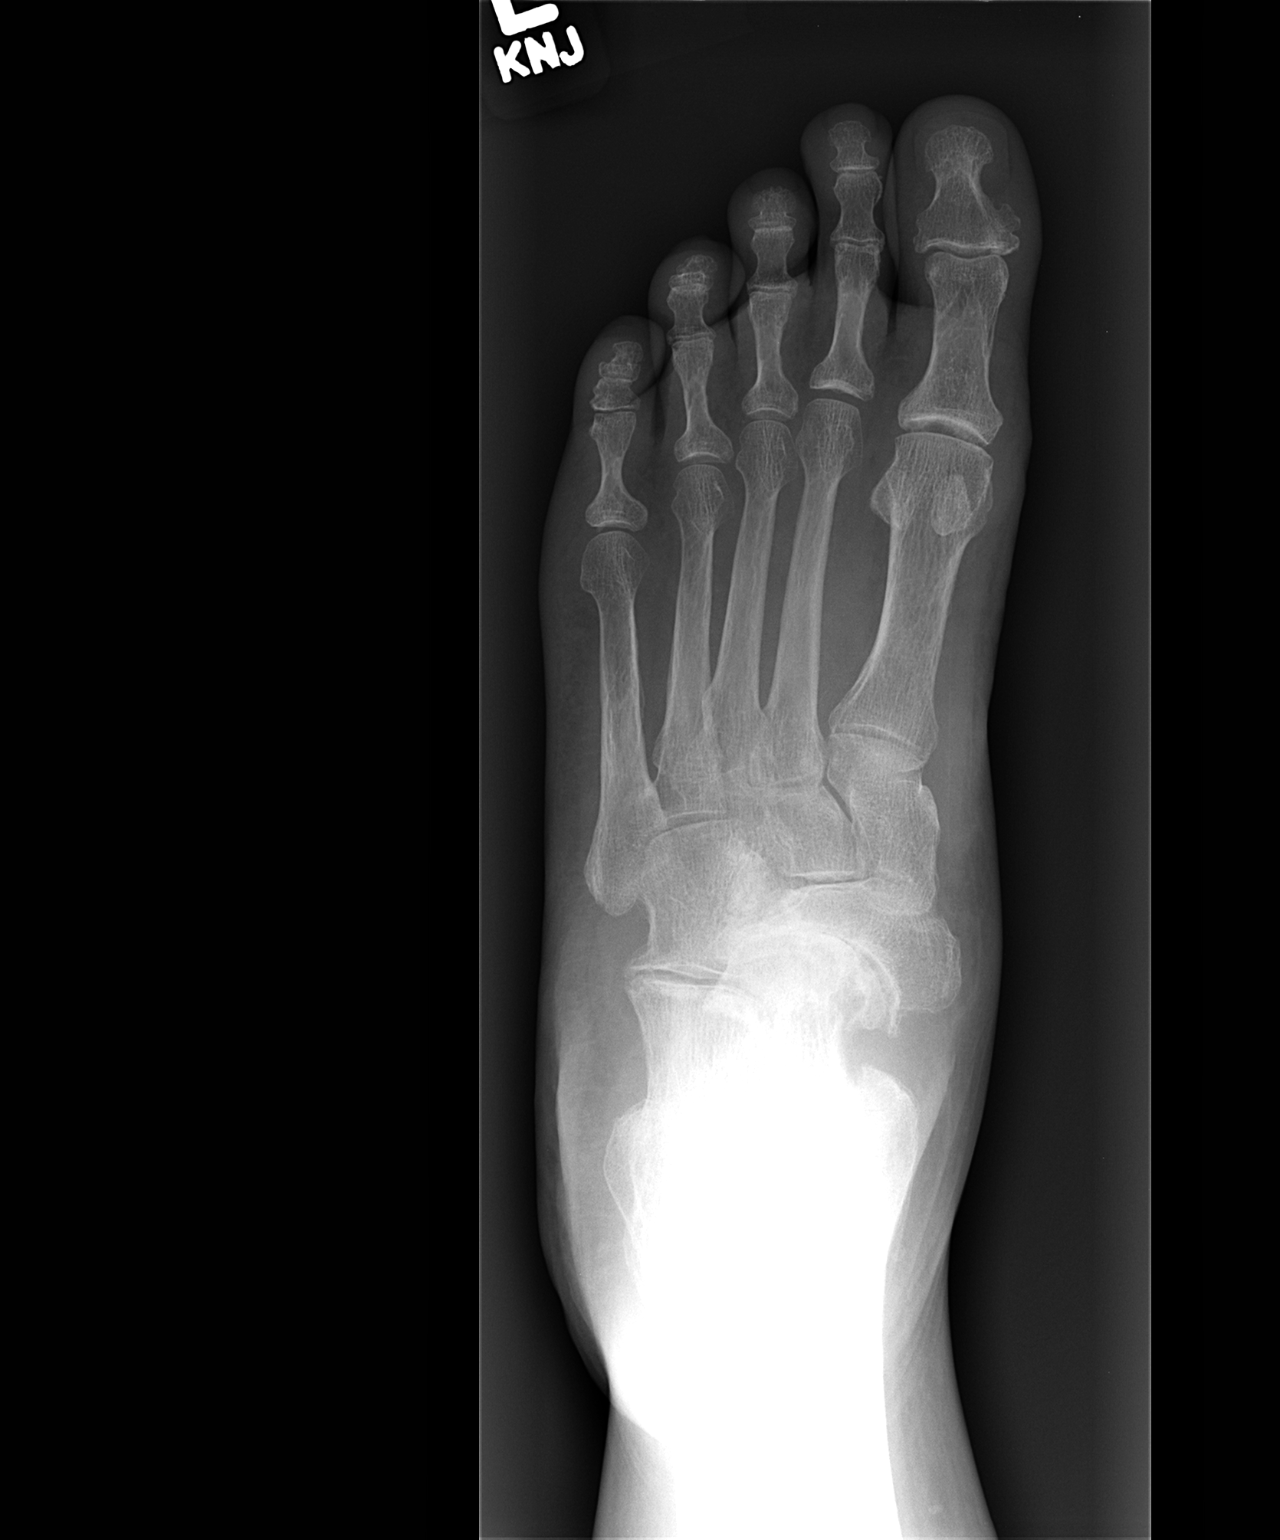

[view not recorded (2 of 3)]
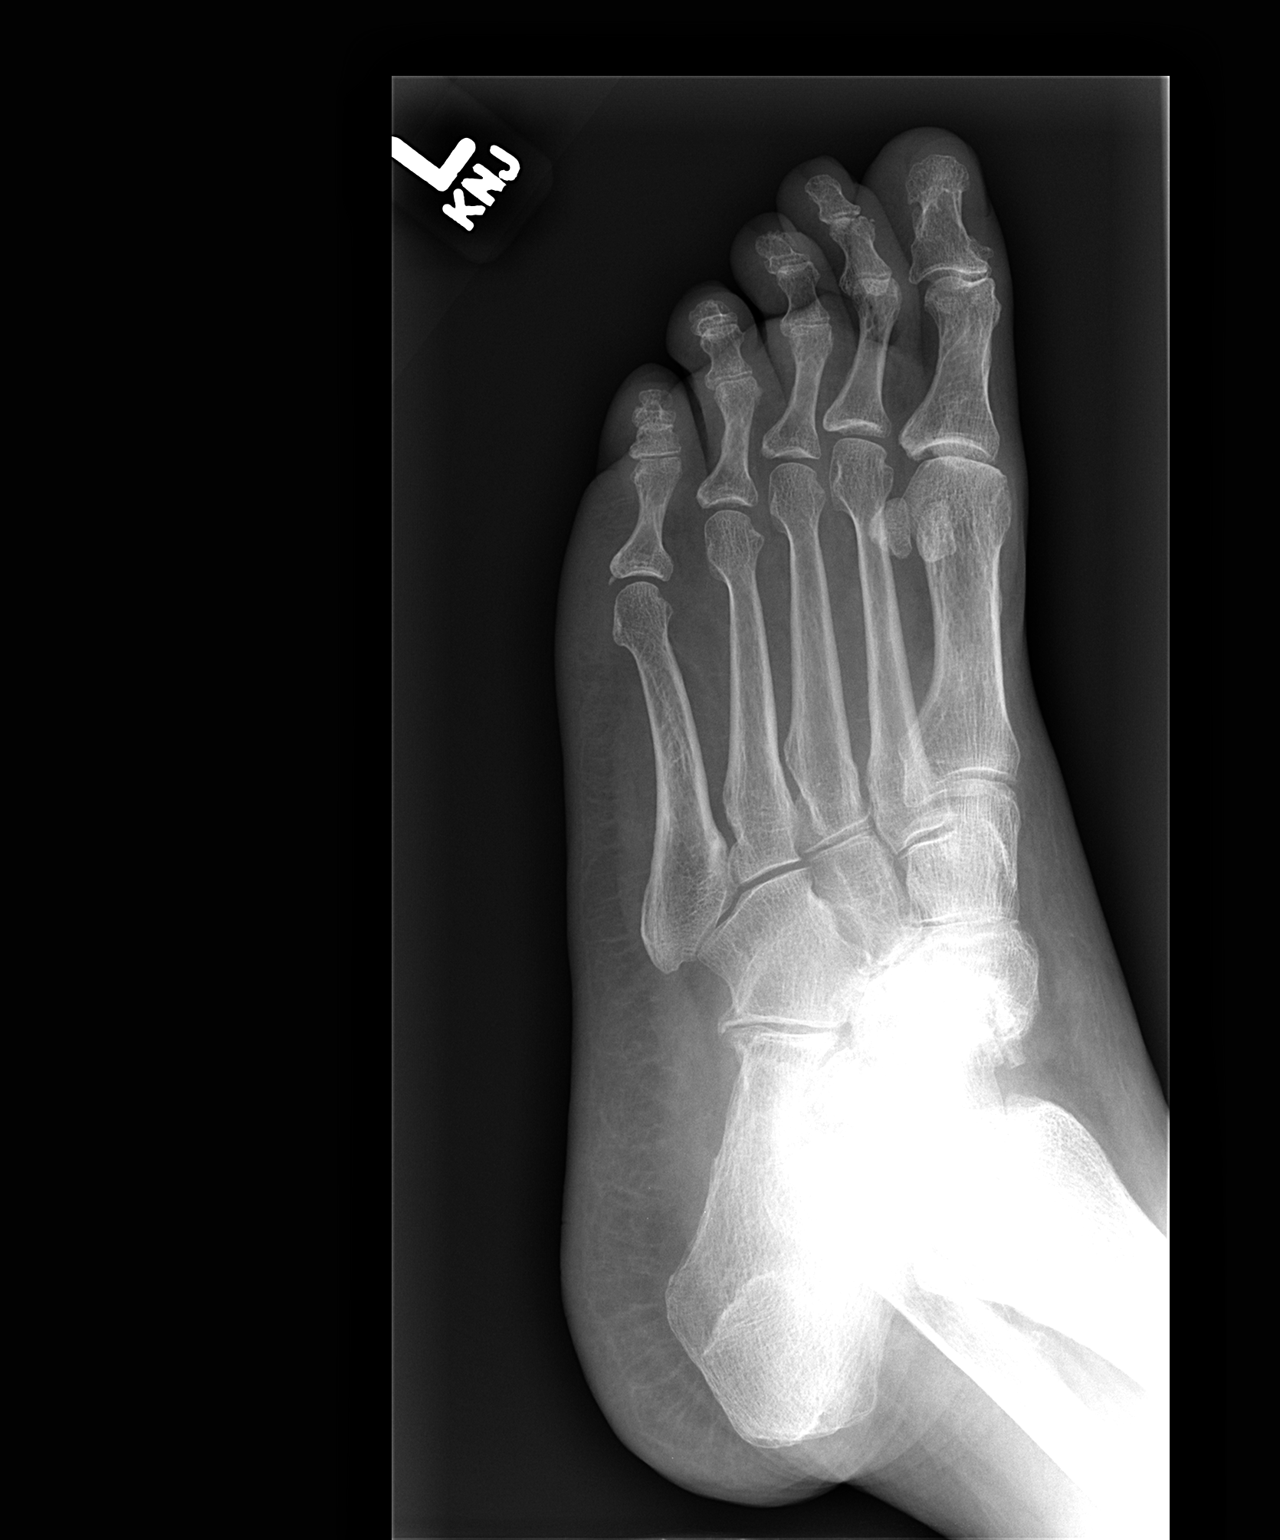

[view not recorded (3 of 3)]
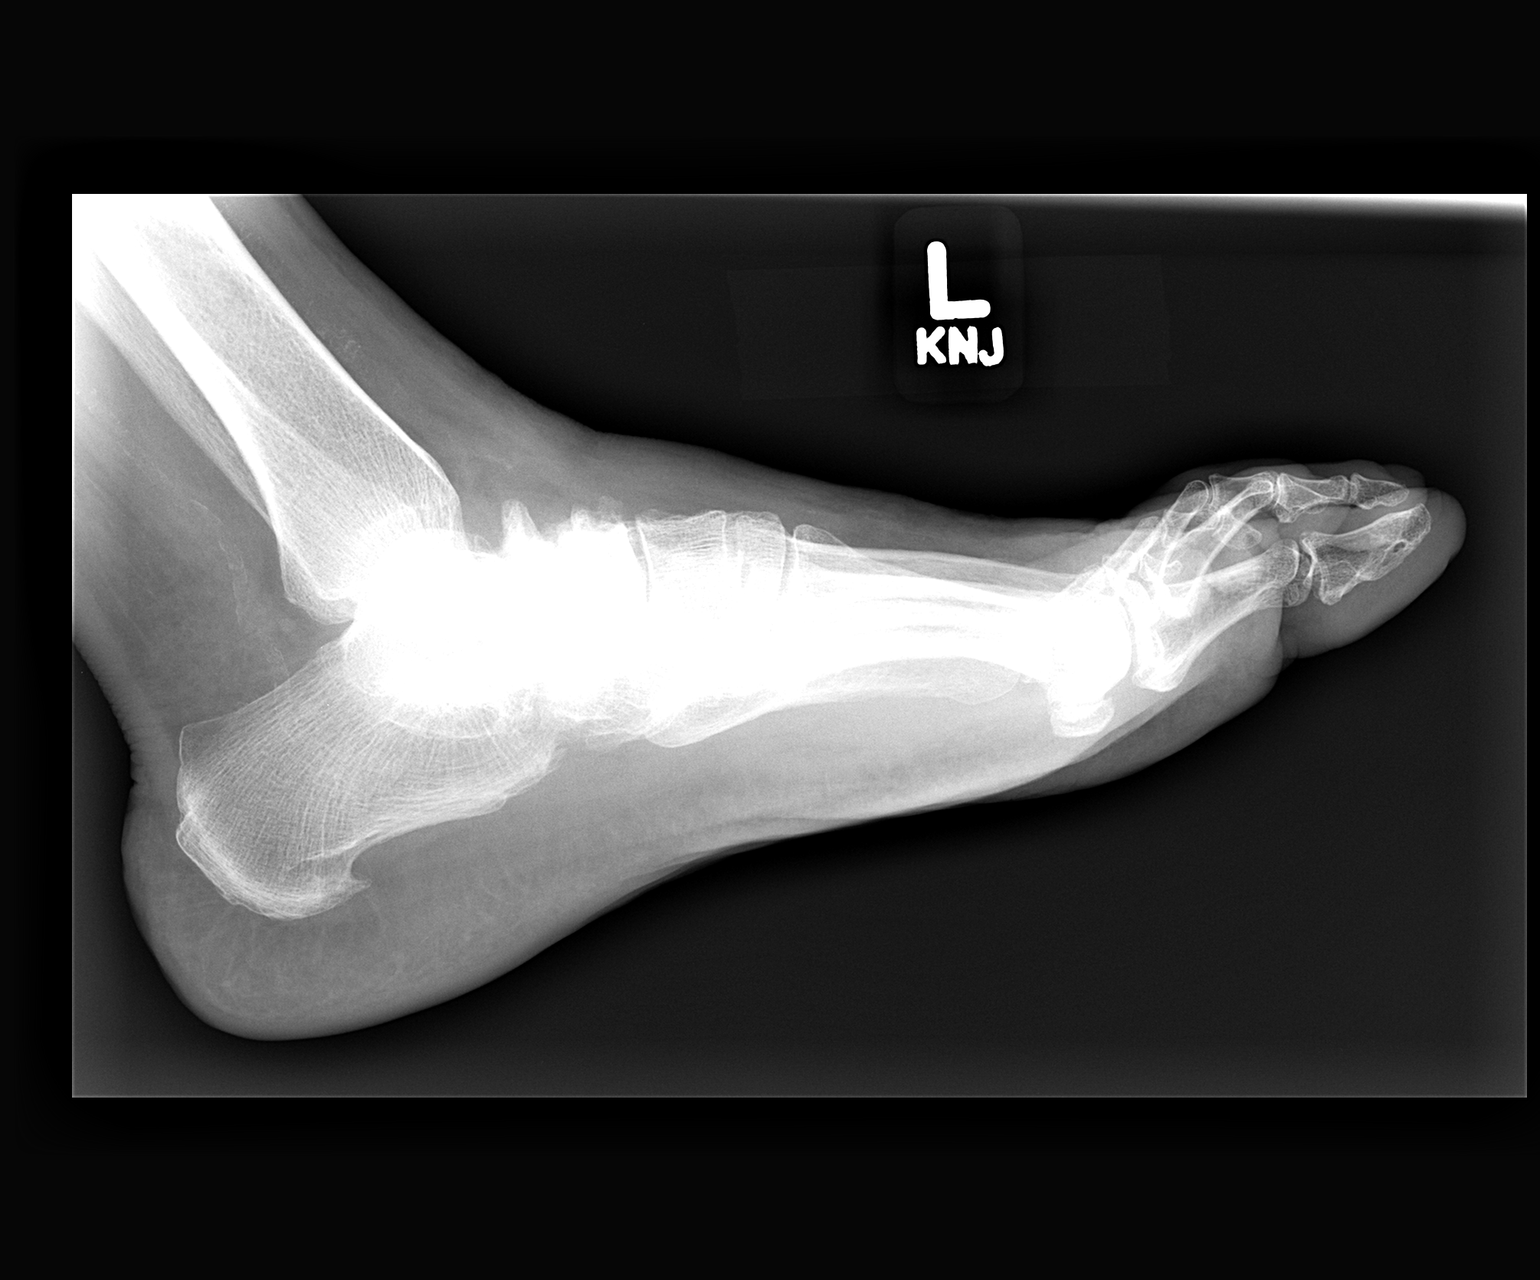

[3 of 3 positions shown; findings below may reference images not displayed]

FINDINGS: There is no fracture or dislocation.  The patient has
severe degenerative disease of the talonavicular joint and
moderately severe degenerative change at the calcaneocuboid joint.
Soft tissues are unremarkable.
IMPRESSION: 1.  No acute finding.
2.  Severe talonavicular and moderately severe calcaneocuboid
osteoarthritis.

## 2012-02-15 ENCOUNTER — Ambulatory Visit (INDEPENDENT_AMBULATORY_CARE_PROVIDER_SITE_OTHER): Payer: No Typology Code available for payment source | Admitting: Internal Medicine

## 2012-02-15 ENCOUNTER — Encounter: Payer: Self-pay | Admitting: Internal Medicine

## 2012-02-15 VITALS — BP 120/84 | HR 47 | Temp 98.1°F | Resp 16 | Wt 156.0 lb

## 2012-02-15 DIAGNOSIS — I10 Essential (primary) hypertension: Secondary | ICD-10-CM

## 2012-02-15 NOTE — Patient Instructions (Signed)

## 2012-02-15 NOTE — Assessment & Plan Note (Signed)
Her heart rate is low so she will stop taking bystolic and will see how she does

## 2012-02-15 NOTE — Progress Notes (Signed)
Subjective:    Patient ID: [name], female    DOB: 05/12/1936, 76 y.o.   MRN: [id number]  Hypertension This is a chronic problem. The current episode started more than 1 year ago. The problem has been gradually improving since onset. The problem is controlled. Pertinent negatives include no anxiety, blurred vision, chest pain, headaches, malaise/fatigue, neck pain, orthopnea, palpitations, peripheral edema, PND, shortness of breath or sweats. Past treatments include diuretics and beta blockers. The current treatment provides significant improvement. Compliance problems include medication side effects, exercise and diet (she is worried that her heart rate is too low).       Review of Systems  Constitutional: Negative for fever, chills, malaise/fatigue, diaphoresis, activity change, appetite change, fatigue and unexpected weight change.  HENT: Negative.  Negative for neck pain.   Eyes: Negative.  Negative for blurred vision.  Respiratory: Negative for cough, choking, chest tightness, shortness of breath, wheezing and stridor.   Cardiovascular: Negative for chest pain, palpitations, orthopnea, leg swelling and PND.  Gastrointestinal: Negative for nausea, vomiting, abdominal pain, diarrhea, constipation and blood in stool.  Genitourinary: Negative.   Musculoskeletal: Negative for myalgias, back pain, joint swelling, arthralgias and gait problem.  Skin: Negative for color change, pallor, rash and wound.  Neurological: Negative for dizziness, tremors, seizures, syncope, facial asymmetry, speech difficulty, weakness, light-headedness, numbness and headaches.  Hematological: Negative for adenopathy. Does not bruise/bleed easily.  Psychiatric/Behavioral: Negative.        Objective:   Physical Exam  Vitals reviewed. Constitutional: She is oriented to person, place, and time. She appears well-developed and well-nourished. No distress.  HENT:  Head: Normocephalic and atraumatic.    Mouth/Throat: Oropharynx is clear and moist. No oropharyngeal exudate.  Eyes: Conjunctivae are normal. Right eye exhibits no discharge. Left eye exhibits no discharge. No scleral icterus.  Neck: Normal range of motion. Neck supple. No JVD present. No tracheal deviation present. No thyromegaly present.  Cardiovascular: Regular rhythm, normal heart sounds and intact distal pulses.  Bradycardia present.  Exam reveals no gallop and no friction rub.   No murmur heard. Pulmonary/Chest: Effort normal and breath sounds normal. No stridor. No respiratory distress. She has no wheezes. She has no rales. She exhibits no tenderness.  Abdominal: Soft. Bowel sounds are normal. She exhibits no distension and no mass. There is no tenderness. There is no rebound and no guarding.  Musculoskeletal: Normal range of motion. She exhibits no edema and no tenderness.  Lymphadenopathy:    She has no cervical adenopathy.  Neurological: She is oriented to person, place, and time.  Skin: Skin is warm and dry. No rash noted. She is not diaphoretic. No erythema. No pallor.  Psychiatric: She has a normal mood and affect. Her behavior is normal. Judgment and thought content normal.      Lab Results  Component Value Date   WBC 4.7 07/30/2011   HGB 10.1* 07/30/2011   HCT 29.4* 07/30/2011   PLT 211.0 07/30/2011   GLUCOSE 91 08/10/2011   CHOL 178 06/24/2011   TRIG 85.0 06/24/2011   HDL 70.20 06/24/2011   LDLDIRECT 139.2 04/15/2011   LDLCALC 91 06/24/2011   ALT 17 07/30/2011   AST 20 07/30/2011   NA 139 08/10/2011   K 3.7 08/10/2011   CL 104 08/10/2011   CREATININE 0.8 08/10/2011   BUN 13 08/10/2011   CO2 27 08/10/2011   TSH 0.40 05/30/2010   INR 1.01 07/04/2011   HGBA1C 5.4 07/30/2011      Assessment & Plan:

## 2012-03-13 ENCOUNTER — Encounter (HOSPITAL_COMMUNITY): Payer: Self-pay | Admitting: *Deleted

## 2012-03-13 ENCOUNTER — Observation Stay (HOSPITAL_COMMUNITY)
Admission: EM | Admit: 2012-03-13 | Discharge: 2012-03-14 | Disposition: A | Discharge status: Home / Self Care | Payer: No Typology Code available for payment source | Attending: Internal Medicine | Admitting: Internal Medicine

## 2012-03-13 ENCOUNTER — Emergency Department (HOSPITAL_COMMUNITY): Payer: No Typology Code available for payment source

## 2012-03-13 DIAGNOSIS — D649 Anemia, unspecified: Secondary | ICD-10-CM

## 2012-03-13 DIAGNOSIS — E785 Hyperlipidemia, unspecified: Secondary | ICD-10-CM

## 2012-03-13 DIAGNOSIS — I712 Thoracic aortic aneurysm, without rupture: Secondary | ICD-10-CM

## 2012-03-13 DIAGNOSIS — R002 Palpitations: Principal | ICD-10-CM

## 2012-03-13 DIAGNOSIS — F411 Generalized anxiety disorder: Secondary | ICD-10-CM | POA: Insufficient documentation

## 2012-03-13 DIAGNOSIS — I10 Essential (primary) hypertension: Secondary | ICD-10-CM | POA: Insufficient documentation

## 2012-03-13 DIAGNOSIS — R0602 Shortness of breath: Secondary | ICD-10-CM | POA: Insufficient documentation

## 2012-03-13 DIAGNOSIS — H3530 Unspecified macular degeneration: Secondary | ICD-10-CM

## 2012-03-13 DIAGNOSIS — I471 Supraventricular tachycardia: Secondary | ICD-10-CM

## 2012-03-13 DIAGNOSIS — Z79899 Other long term (current) drug therapy: Secondary | ICD-10-CM | POA: Insufficient documentation

## 2012-03-13 DIAGNOSIS — R0789 Other chest pain: Secondary | ICD-10-CM | POA: Insufficient documentation

## 2012-03-13 DIAGNOSIS — K219 Gastro-esophageal reflux disease without esophagitis: Secondary | ICD-10-CM | POA: Insufficient documentation

## 2012-03-13 DIAGNOSIS — M1710 Unilateral primary osteoarthritis, unspecified knee: Secondary | ICD-10-CM

## 2012-03-13 DIAGNOSIS — R079 Chest pain, unspecified: Secondary | ICD-10-CM

## 2012-03-13 DIAGNOSIS — E041 Nontoxic single thyroid nodule: Secondary | ICD-10-CM

## 2012-03-13 DIAGNOSIS — F419 Anxiety disorder, unspecified: Secondary | ICD-10-CM | POA: Diagnosis present

## 2012-03-13 LAB — CBC WITH DIFFERENTIAL/PLATELET
Basophils Absolute: 0 10*3/uL (ref 0.0–0.1)
Basophils Relative: 0 % (ref 0–1)
Eosinophils Absolute: 0.2 10*3/uL (ref 0.0–0.7)
Eosinophils Relative: 5 % (ref 0–5)
HCT: 34.6 % — ABNORMAL LOW (ref 36.0–46.0)
Hemoglobin: 12.3 g/dL (ref 12.0–15.0)
Lymphocytes Relative: 58 % — ABNORMAL HIGH (ref 12–46)
Lymphs Abs: 2.9 10*3/uL (ref 0.7–4.0)
MCH: 32.6 pg (ref 26.0–34.0)
MCHC: 35.5 g/dL (ref 30.0–36.0)
MCV: 91.8 fL (ref 78.0–100.0)
Monocytes Absolute: 0.3 10*3/uL (ref 0.1–1.0)
Monocytes Relative: 6 % (ref 3–12)
Neutro Abs: 1.6 10*3/uL — ABNORMAL LOW (ref 1.7–7.7)
Neutrophils Relative %: 31 % — ABNORMAL LOW (ref 43–77)
Platelets: 179 10*3/uL (ref 150–400)
RBC: 3.77 MIL/uL — ABNORMAL LOW (ref 3.87–5.11)
RDW: 13.2 % (ref 11.5–15.5)
WBC: 5 10*3/uL (ref 4.0–10.5)

## 2012-03-13 LAB — TROPONIN I
Troponin I: <0.3 ng/mL (ref <0.30)
Troponin I: <0.3 ng/mL (ref <0.30)
Troponin I: <0.3 ng/mL (ref <0.30)
Troponin I: <0.3 ng/mL (ref <0.30)

## 2012-03-13 LAB — BASIC METABOLIC PANEL
BUN: 8 mg/dL (ref 6–23)
CO2: 24 mEq/L (ref 19–32)
Calcium: 9.6 mg/dL (ref 8.4–10.5)
Chloride: 102 mEq/L (ref 96–112)
Creatinine, Ser: 0.64 mg/dL (ref 0.50–1.10)
GFR calc Af Amer: >90 mL/min (ref >90)
GFR calc non Af Amer: 86 mL/min — ABNORMAL LOW (ref >90)
Glucose, Bld: 104 mg/dL — ABNORMAL HIGH (ref 70–99)
Potassium: 4.4 mEq/L (ref 3.5–5.1)
Sodium: 137 mEq/L (ref 135–145)

## 2012-03-13 LAB — POCT I-STAT TROPONIN I: Troponin i, poc: 0 ng/mL (ref 0.00–0.08)

## 2012-03-13 LAB — CK TOTAL AND CKMB (NOT AT ARMC)
CK, MB: 2.4 ng/mL (ref 0.3–4.0)
CK, MB: 2.1 ng/mL (ref 0.3–4.0)
Relative Index: 2.3 (ref 0.0–2.5)
Relative Index: INVALID (ref 0.0–2.5)
Total CK: 106 U/L (ref 7–177)
Total CK: 98 U/L (ref 7–177)

## 2012-03-13 LAB — TSH: TSH: 0.956 u[IU]/mL (ref 0.350–4.500)

## 2012-03-13 MED ORDER — ONDANSETRON HCL 4 MG PO TABS: 4.0000 mg | ORAL_TABLET | Freq: Four times a day (QID) | ORAL | Status: DC | PRN

## 2012-03-13 MED ORDER — PANTOPRAZOLE SODIUM 40 MG PO TBEC
40.0000 mg | DELAYED_RELEASE_TABLET | Freq: Every day | ORAL | Status: DC
  Administered 2012-03-14: 40 mg via ORAL
  Filled 2012-03-13: qty 1

## 2012-03-13 MED ORDER — SODIUM CHLORIDE 0.9 % IJ SOLN: 3.0000 mL | INTRAMUSCULAR | Status: DC | PRN

## 2012-03-13 MED ORDER — MECLIZINE HCL 25 MG PO TABS
25.0000 mg | ORAL_TABLET | Freq: Three times a day (TID) | ORAL | Status: DC | PRN
  Filled 2012-03-13: qty 1

## 2012-03-13 MED ORDER — ACETAMINOPHEN 650 MG RE SUPP: 650.0000 mg | Freq: Four times a day (QID) | RECTAL | Status: DC | PRN

## 2012-03-13 MED ORDER — CLONAZEPAM 0.5 MG PO TABS
0.5000 mg | ORAL_TABLET | Freq: Two times a day (BID) | ORAL | Status: DC | PRN
Start: 2012-03-13 — End: 2012-03-14
  Administered 2012-03-13 (×2): 0.5 mg via ORAL
  Filled 2012-03-13 (×2): qty 1

## 2012-03-13 MED ORDER — SODIUM CHLORIDE 0.9 % IJ SOLN
3.0000 mL | Freq: Two times a day (BID) | INTRAMUSCULAR | Status: DC
  Administered 2012-03-13 – 2012-03-14 (×2): 3 mL via INTRAVENOUS

## 2012-03-13 MED ORDER — HEPARIN SODIUM (PORCINE) 5000 UNIT/ML IJ SOLN
5000.0000 [IU] | Freq: Three times a day (TID) | INTRAMUSCULAR | Status: DC
  Administered 2012-03-13 – 2012-03-14 (×3): 5000 [IU] via SUBCUTANEOUS
  Filled 2012-03-13 (×6): qty 1

## 2012-03-13 MED ORDER — ASPIRIN EC 81 MG PO TBEC
81.0000 mg | DELAYED_RELEASE_TABLET | Freq: Every day | ORAL | Status: DC
  Administered 2012-03-14: 81 mg via ORAL
  Filled 2012-03-13: qty 1

## 2012-03-13 MED ORDER — ACETAMINOPHEN 325 MG PO TABS: 650.0000 mg | ORAL_TABLET | Freq: Four times a day (QID) | ORAL | Status: DC | PRN

## 2012-03-13 MED ORDER — METOPROLOL TARTRATE 12.5 MG HALF TABLET
12.5000 mg | ORAL_TABLET | Freq: Two times a day (BID) | ORAL | Status: DC
  Administered 2012-03-13 – 2012-03-14 (×3): 12.5 mg via ORAL
  Filled 2012-03-13 (×4): qty 1

## 2012-03-13 MED ORDER — SODIUM CHLORIDE 0.9 % IV SOLN
INTRAVENOUS | Status: DC
  Administered 2012-03-13: 06:00:00 via INTRAVENOUS

## 2012-03-13 MED ORDER — SODIUM CHLORIDE 0.9 % IV SOLN: 250.0000 mL | INTRAVENOUS | Status: DC | PRN

## 2012-03-13 MED ORDER — ASPIRIN 81 MG PO CHEW
324.0000 mg | CHEWABLE_TABLET | Freq: Once | ORAL | Status: AC
  Administered 2012-03-13: 324 mg via ORAL
  Filled 2012-03-13: qty 4

## 2012-03-13 MED ORDER — INFLUENZA VIRUS VACC SPLIT PF IM SUSP
0.5000 mL | INTRAMUSCULAR | Status: AC
  Administered 2012-03-14: 0.5 mL via INTRAMUSCULAR
  Filled 2012-03-13: qty 0.5

## 2012-03-13 MED ORDER — SODIUM CHLORIDE 0.9 % IJ SOLN: 3.0000 mL | Freq: Two times a day (BID) | INTRAMUSCULAR | Status: DC

## 2012-03-13 MED ORDER — ONDANSETRON HCL 4 MG/2ML IJ SOLN: 4.0000 mg | Freq: Four times a day (QID) | INTRAMUSCULAR | Status: DC | PRN

## 2012-03-13 NOTE — Plan of Care (Signed)
Problem: Phase I Progression Outcomes Goal: Anginal pain relieved Outcome: Not Applicable Date Met:  03/13/12 Denies chest pain

## 2012-03-13 NOTE — ED Provider Notes (Signed)
History     CSN: [id number]  Arrival date & time 03/13/12  4098   First MD Initiated Contact with Patient 03/13/12 [phone]      Chief Complaint  Patient presents with  . Chest Pain    (Consider location/radiation/quality/duration/timing/severity/associated sxs/prior treatment) HPI HX per PT, CP and palpitation since this am, worse with exertion. No h/o MI or known CAD. HA sworn holter montior in the past for palpitations but she tells me this is differnet, mod in severity. Pain described as a lump that radiates to her throat. Some SOB, no DOE. Mod in severity, relieved by rest.   Past Medical History  Diagnosis Date  . Palpitations   . HTN (hypertension)   . Macular degeneration (senile) of retina, unspecified   . Nontoxic uninodular goiter   . Diffuse cystic mastopathy   . Anxiety   . Hyperlipidemia   . History of colonoscopy   . AAA (abdominal aortic aneurysm)     Past Surgical History  Procedure Date  . Total knee arthroplasty     Family History  Problem Relation Age of Onset  . Colon cancer Neg Hx   . Cancer Neg Hx   . Heart disease Neg Hx   . Coronary artery disease Other     History  Substance Use Topics  . Smoking status: Never Smoker   . Smokeless tobacco: Not on file  . Alcohol Use: No    OB History    Grav Para Term Preterm Abortions TAB SAB Ect Mult Living                  Review of Systems  Constitutional: Negative for fever and chills.  HENT: Negative for neck pain and neck stiffness.   Eyes: Negative for pain.  Respiratory: Positive for shortness of breath. Negative for cough.   Cardiovascular: Positive for chest pain and palpitations. Negative for leg swelling.  Gastrointestinal: Negative for abdominal pain.  Genitourinary: Negative for dysuria.  Musculoskeletal: Negative for back pain.  Skin: Negative for rash.  Neurological: Negative for headaches.  All other systems reviewed and are negative.    Allergies  Ibuprofen;  Rosuvastatin; and Statins  Home Medications   Current Outpatient Rx  Name Route Sig Dispense Refill  . AMLODIPINE-OLMESARTAN 5-20 MG PO TABS Oral Take 1 tablet by mouth daily.    Marland Kitchen CLONAZEPAM 0.5 MG PO TABS Oral Take 1 tablet (0.5 mg total) by mouth 2 (two) times daily as needed. For anxiety 60 tablet 5  . KLOR-CON M20 20 MEQ PO TBCR  TAKE 1 TABLET BY MOUTH EVERY DAY 30 tablet 2  . MECLIZINE HCL 25 MG PO TABS  TAKE 1 TABLET BY MOUTH 3 TIMES A DAY AS NEEDED VERTIGO 50 tablet 0  . MULTIGEN 70 MG PO TABS Oral Take 1 tablet (70 mg total) by mouth daily. 30 each 6  . SUPER HIGH VITAMINS/MINERALS PO TABS Oral Take 1 tablet by mouth daily.        BP 149/79  Pulse 99  Temp 98.1 F (36.7 C) (Oral)  Resp 17  SpO2 99%  Physical Exam  Constitutional: She is oriented to person, place, and time. She appears well-developed and well-nourished.  HENT:  Head: Normocephalic and atraumatic.  Eyes: Conjunctivae normal and EOM are normal. Pupils are equal, round, and reactive to light.  Neck: Trachea normal. Neck supple. No thyromegaly present.  Cardiovascular: Normal rate, regular rhythm, S1 normal, S2 normal and normal pulses.     No systolic  murmur is present   No diastolic murmur is present  Pulses:      Radial pulses are 2+ on the right side, and 2+ on the left side.  Pulmonary/Chest: Effort normal and breath sounds normal. She has no wheezes. She has no rhonchi. She has no rales. She exhibits no tenderness.  Abdominal: Soft. Normal appearance and bowel sounds are normal. There is no tenderness. There is no CVA tenderness and negative Murphy's sign.  Musculoskeletal:       BLE:s Calves nontender, no cords or erythema, negative Homans sign  Neurological: She is alert and oriented to person, place, and time. She has normal strength. No cranial nerve deficit or sensory deficit. GCS eye subscore is 4. GCS verbal subscore is 5. GCS motor subscore is 6.  Skin: Skin is warm and dry. No rash noted. She  is not diaphoretic.  Psychiatric: Her speech is normal.       Cooperative and appropriate    ED Course  Procedures (including critical care time)  Results for orders placed during the hospital encounter of 03/13/12  CBC WITH DIFFERENTIAL      Component Value Range   WBC 5.0  4.0 - 10.5 K/uL   RBC 3.77 (*) 3.87 - 5.11 MIL/uL   Hemoglobin 12.3  12.0 - 15.0 g/dL   HCT 57.8 (*) 46.9 - 62.9 %   MCV 91.8  78.0 - 100.0 fL   MCH 32.6  26.0 - 34.0 pg   MCHC 35.5  30.0 - 36.0 g/dL   RDW 52.8  41.3 - 24.4 %   Platelets 179  150 - 400 K/uL   Neutrophils Relative 31 (*) 43 - 77 %   Neutro Abs 1.6 (*) 1.7 - 7.7 K/uL   Lymphocytes Relative 58 (*) 12 - 46 %   Lymphs Abs 2.9  0.7 - 4.0 K/uL   Monocytes Relative 6  3 - 12 %   Monocytes Absolute 0.3  0.1 - 1.0 K/uL   Eosinophils Relative 5  0 - 5 %   Eosinophils Absolute 0.2  0.0 - 0.7 K/uL   Basophils Relative 0  0 - 1 %   Basophils Absolute 0.0  0.0 - 0.1 K/uL  BASIC METABOLIC PANEL      Component Value Range   Sodium 137  135 - 145 mEq/L   Potassium 4.4  3.5 - 5.1 mEq/L   Chloride 102  96 - 112 mEq/L   CO2 24  19 - 32 mEq/L   Glucose, Bld 104 (*) 70 - 99 mg/dL   BUN 8  6 - 23 mg/dL   Creatinine, Ser 0.10  0.50 - 1.10 mg/dL   Calcium 9.6  8.4 - 27.2 mg/dL   GFR calc non Af Amer 86 (*) >90 mL/min   GFR calc Af Amer >90  >90 mL/min  TROPONIN I      Component Value Range   Troponin I <0.30  <0.30 ng/mL  POCT I-STAT TROPONIN I      Component Value Range   Troponin i, poc 0.00  0.00 - 0.08 ng/mL   Comment 3            Dg Chest Portable 1 View  03/13/2012  *RADIOLOGY REPORT*  Clinical Data: Chest pain  PORTABLE CHEST - 1 VIEW  Comparison: 03/17/2010  Findings: The heart size and mediastinal contours are within normal limits.  Both lungs are clear.  The visualized skeletal structures are unremarkable.  IMPRESSION: Negative exam.   Original Report  Authenticated By: Rosealee Albee, M.D.      Date: 03/13/2012  Rate: 95  Rhythm: normal  sinus rhythm  QRS Axis: left  Intervals: normal  ST/T Wave abnormalities: nonspecific ST changes  Conduction Disutrbances:nonspecific intraventricular conduction delay  Narrative Interpretation:   Old EKG Reviewed: unchanged  Gets symptomatic with ambulating to the bathroom.   IVFs. ASA. MED consult  6:27 AM Dr Kara Pacer to admit tele team 8.  MDM   VS and old records and nursing notes reviewed  ECG, CXR, labs reviewed.  MED admit        Sunnie Nielsen, MD 03/13/12 [id number]

## 2012-03-13 NOTE — ED Notes (Signed)
Pt denies cp, nv or sob

## 2012-03-13 NOTE — ED Notes (Signed)
Paged vega, reported pt is going to 4th floor west. He reports pt can go to the this floor.

## 2012-03-13 NOTE — ED Notes (Signed)
Pt states she was woke up with chest pain,  Some sob denies nausea vomiting

## 2012-03-13 NOTE — H&P (Signed)
[facility] History and Physical  MAIREN WALLENSTEIN ZOX:[id number] DOB: [birth year] DOA: 03/13/2012  Referring physician: Sunnie Nielsen PCP: Sanda Linger, MD   Chief Complaint: Palpitations  HPI: [name] is a 76 y.o. female  With history of anxiety, GERD, and atrial flutter.  Patient presents to the hospital complaining of palpitations that occurred last night around 9 PM.  She states the problem has persisted and nothing makes it better or worse.  It is not associated with discomfort.  She does report however that at times with activity she gets lightheaded.  The problem comes and goes from time of onset.  Otherwise patient has no other complaints.   In the ED patient had normal WBC of 5.0, was afebrile, Had negative troponin  Review of Systems: The patient denies anorexia, fever, weight loss, vision loss, decreased hearing, hoarseness, chest pain, syncope, dyspnea on exertion, peripheral edema, balance deficits, hemoptysis, abdominal pain, melena, hematochezia, severe indigestion/heartburn, hematuria, incontinence, genital sores, muscle weakness, suspicious skin lesions, transient blindness, difficulty walking, depression, unusual weight change, abnormal bleeding, enlarged lymph nodes, angioedema, and breast masses.    Past Medical History  Diagnosis Date  . Palpitations   . HTN (hypertension)   . Macular degeneration (senile) of retina, unspecified   . Nontoxic uninodular goiter   . Diffuse cystic mastopathy   . Anxiety   . Hyperlipidemia   . History of colonoscopy   . AAA (abdominal aortic aneurysm)    Past Surgical History  Procedure Date  . Total knee arthroplasty    Social History:  reports that she has never smoked. She does not have any smokeless tobacco history on file. She reports that she does not drink alcohol or use illicit drugs. Pt lives at home Can patient participate in ADLs? yes  Allergies  Allergen Reactions  . Ibuprofen     REACTION: Blurry vision    . Rosuvastatin     REACTION: muscle aches  . Statins     Family History  Problem Relation Age of Onset  . Colon cancer Neg Hx   . Cancer Neg Hx   . Heart disease Neg Hx   . Coronary artery disease Other   Anxiety was also endorsed  Prior to Admission medications   Medication Sig Start Date End Date Taking? Authorizing Provider  amLODipine-olmesartan (AZOR) 5-20 MG per tablet Take 1 tablet by mouth daily.   Yes Historical Provider, MD  clonazePAM (KLONOPIN) 0.5 MG tablet Take 1 tablet (0.5 mg total) by mouth 2 (two) times daily as needed. For anxiety 09/25/11  Yes Etta Grandchild, MD  KLOR-CON M20 20 MEQ tablet TAKE 1 TABLET BY MOUTH EVERY DAY 01/14/12  Yes Etta Grandchild, MD  meclizine (ANTIVERT) 25 MG tablet TAKE 1 TABLET BY MOUTH 3 TIMES A DAY AS NEEDED VERTIGO 09/28/11  Yes Etta Grandchild, MD  mineral/vitamin supplement (MULTIGEN) 70 MG TABS Take 1 tablet (70 mg total) by mouth daily. 08/10/11  Yes Pricilla Riffle, MD  Multiple Vitamins-Minerals (MULTIVITAMIN,TX-MINERALS) tablet Take 1 tablet by mouth daily.     Yes Historical Provider, MD   Physical Exam: Filed Vitals:   03/13/12 0403 03/13/12 0408  BP: 150/83 149/79  Pulse: 95 99  Temp: 98.3 F (36.8 C) 98.1 F (36.7 C)  TempSrc: Oral Oral  Resp: 17   SpO2: 95% 99%     General:  Pt in NAD, Laying supine in bed smiling  Eyes: EOMI, PERRLA  ENT: normal exterior appearance, no masses on visual  examination  Neck: supple, no goiter  Cardiovascular: RRR, No MRG  Respiratory: CTA BL, no wheezes  Abdomen: soft, NT, ND  Skin: no diaphoresis, warm, dry  Musculoskeletal: no cyanosis or clubbing  Psychiatric: mood and affect appropriate  Neurologic: Answers questions appropriately and moves all extremities  Labs on Admission:  Basic Metabolic Panel:  Lab 03/13/12 1610  NA 137  K 4.4  CL 102  CO2 24  GLUCOSE 104*  BUN 8  CREATININE 0.64  CALCIUM 9.6  MG --  PHOS --   Liver Function Tests: No results found for  this basename: AST:5,ALT:5,ALKPHOS:5,BILITOT:5,PROT:5,ALBUMIN:5 in the last 168 hours No results found for this basename: LIPASE:5,AMYLASE:5 in the last 168 hours No results found for this basename: AMMONIA:5 in the last 168 hours CBC:  Lab 03/13/12 0440  WBC 5.0  NEUTROABS 1.6*  HGB 12.3  HCT 34.6*  MCV 91.8  PLT 179   Cardiac Enzymes:  Lab 03/13/12 0440  CKTOTAL --  CKMB --  CKMBINDEX --  TROPONINI <0.30    BNP (last 3 results) No results found for this basename: PROBNP:3 in the last 8760 hours CBG: No results found for this basename: GLUCAP:5 in the last 168 hours  Radiological Exams on Admission: Dg Chest Portable 1 View  03/13/2012  *RADIOLOGY REPORT*  Clinical Data: Chest pain  PORTABLE CHEST - 1 VIEW  Comparison: 03/17/2010  Findings: The heart size and mediastinal contours are within normal limits.  Both lungs are clear.  The visualized skeletal structures are unremarkable.  IMPRESSION: Negative exam.   Original Report Authenticated By: Rosealee Albee, M.D.     EKG: Independently reviewed. No ST elevations or depression  Assessment/Plan Active Problems: Heart palpitations Reflux Anxiety hpl HTN   1. Heart palpitations - rule out ACS - monitor on telemetry - place on B blocker for hypertension not well controlled and evaluate response - May be secondary to known history of anxiety currently which is untreated at this juncture - Until I have ruled out ACS will plan on maintaining on aspirin.  2. GERD - Place on protonix while in house and monitor response  3. Anxiety - Will recommend that patient follow up with primary care physician as outpatient for further evaluation and recommendations as this may be contributing to the overall picture.  4. HTN - Will place on B blocker low dose and evaluate response. - Hold her home regimen for now.   Code Status: full Family Communication: spoke with patient and family member at bedside. Disposition Plan:  Pending work up results  Time spent: > 50 minutes  Penny Pia [facility] Pager [phone]  If 7PM-7AM, please contact night-coverage [email or web address] Password [facility] 03/13/2012, 7:50 AM

## 2012-03-13 NOTE — Progress Notes (Signed)
Patient states she feels more relaxed after receiving Klonopin and Lopressor.  Family agrees.  Son states she is anxious every day. Family shared patient is going through some changes at home.

## 2012-03-13 NOTE — ED Notes (Signed)
Report given to dawn, rn on floor. rn explained pts 1st troponin ordered for 0630 was drawn at 0825. Blood draw stickers will be sent up with pt.

## 2012-03-14 DIAGNOSIS — R079 Chest pain, unspecified: Secondary | ICD-10-CM

## 2012-03-14 LAB — CBC
HCT: 32.8 % — ABNORMAL LOW (ref 36.0–46.0)
Hemoglobin: 11.4 g/dL — ABNORMAL LOW (ref 12.0–15.0)
MCH: 32 pg (ref 26.0–34.0)
MCHC: 34.8 g/dL (ref 30.0–36.0)
MCV: 92.1 fL (ref 78.0–100.0)
Platelets: 186 10*3/uL (ref 150–400)
RBC: 3.56 MIL/uL — ABNORMAL LOW (ref 3.87–5.11)
RDW: 13 % (ref 11.5–15.5)
WBC: 5 10*3/uL (ref 4.0–10.5)

## 2012-03-14 LAB — BASIC METABOLIC PANEL
BUN: 18 mg/dL (ref 6–23)
CO2: 24 mEq/L (ref 19–32)
Calcium: 9.1 mg/dL (ref 8.4–10.5)
Chloride: 104 mEq/L (ref 96–112)
Creatinine, Ser: 0.81 mg/dL (ref 0.50–1.10)
GFR calc Af Amer: 81 mL/min — ABNORMAL LOW (ref >90)
GFR calc non Af Amer: 70 mL/min — ABNORMAL LOW (ref >90)
Glucose, Bld: 90 mg/dL (ref 70–99)
Potassium: 3.8 mEq/L (ref 3.5–5.1)
Sodium: 138 mEq/L (ref 135–145)

## 2012-03-14 MED ORDER — METOPROLOL TARTRATE 12.5 MG HALF TABLET: 12.5000 mg | ORAL_TABLET | Freq: Two times a day (BID) | ORAL | Status: DC

## 2012-03-14 NOTE — Discharge Summary (Signed)
Physician Discharge Summary  [name] ZOX:[id number] DOB: October 10, 1935 DOA: 03/13/2012  PCP: Sanda Linger, MD  Admit date: 03/13/2012 Discharge date: 03/14/2012  Recommendations for Outpatient Follow-up:  1. Please check patient's blood pressure as her medication regimen has changed.  Adjust antihypertensive medication as needed pending blood pressure review 2. Patient seems to be having anxiety please consider further monitoring as outpatient and treatment of her anxiety  Discharge Diagnoses:  Active Problems:  HYPERLIPIDEMIA  ANXIETY  GERD (gastroesophageal reflux disease)  Heart palpitations   Discharge Condition: Stable   Diet recommendation: Cardiac diet  Filed Weights   03/13/12 0910 03/13/12 0924  Weight: 67.4 kg (148 lb 9.4 oz) 67.4 kg (148 lb 9.4 oz)    History of present illness:  From original HPI: [name] is a 76 y.o. female  With history of anxiety, GERD, and atrial flutter. Patient presents to the hospital complaining of palpitations that occurred last night around 9 PM. She states the problem has persisted and nothing makes it better or worse. It is not associated with discomfort. She does report however that at times with activity she gets lightheaded. The problem comes and goes from time of onset. Otherwise patient has no other complaints.  In the ED patient had normal WBC of 5.0, was afebrile, Had negative troponin   Hospital Course:   1. Heart palpitations - ruled out ACS, cardiac enzymes negativ - Telemetry monitoring did not reveal any red flags - Placed on B blocker and tolerated well will plan on discharging on this regimen.  - Most likely secondary to known history of anxiety.   - Will discontinue aspirin as patient had negative work up  2. GERD  - Placed on protonix while in house. Have patient follow up with her primary care physician.   3. Anxiety  - Will recommend that patient follow up with primary care physician as outpatient for  further evaluation and recommendations as this may be contributing to the overall picture.  - Patient reports being under a lot of stress lately and reports that she had to recently move and did not have much help.  4. HTN  - Place on B blocker for blood pressure control   Procedures:  12 lead ekg showed sinus rhythm  Consultations:  none  Discharge Exam: Filed Vitals:   03/13/12 0924 03/13/12 1314 03/13/12 2139 03/14/12 0644  BP:  105/67 126/79 106/64  Pulse:  69 83 70  Temp:  98.2 F (36.8 C) 98.5 F (36.9 C) 98 F (36.7 C)  TempSrc:  Oral Oral Oral  Resp:  18  18  Height: 5\' 6"  (1.676 m)     Weight: 67.4 kg (148 lb 9.4 oz)     SpO2:  100% 99% 96%    General: Pt in NAD, A and O x3 Cardiovascular: RRR, No MRG Respiratory: CTA BL, no wheezes  Discharge Instructions  Discharge Orders    Future Orders Please Complete By Expires   Diet - low sodium heart healthy      Increase activity slowly      Discharge instructions      Comments:   Please be sure to follow up with your primary care physician in 1-2 weeks or sooner should any new concerns arise.   Call MD for:  temperature >100.4      Call MD for:  persistant dizziness or light-headedness      Call MD for:  extreme fatigue  Medication List     As of 03/14/2012  9:19 AM    STOP taking these medications         amLODipine-olmesartan 5-20 MG per tablet   Commonly known as: AZOR      KLOR-CON M20 20 MEQ tablet   Generic drug: potassium chloride SA      TAKE these medications         clonazePAM 0.5 MG tablet   Commonly known as: KLONOPIN   Take 1 tablet (0.5 mg total) by mouth 2 (two) times daily as needed. For anxiety      meclizine 25 MG tablet   Commonly known as: ANTIVERT   TAKE 1 TABLET BY MOUTH 3 TIMES A DAY AS NEEDED VERTIGO      metoprolol tartrate 12.5 mg Tabs   Commonly known as: LOPRESSOR   Take 0.5 tablets (12.5 mg total) by mouth 2 (two) times daily.      mineral/vitamin  supplement 70 MG Tabs   Take 1 tablet (70 mg total) by mouth daily.      multivitamin,tx-minerals tablet   Take 1 tablet by mouth daily.          The results of significant diagnostics from this hospitalization (including imaging, microbiology, ancillary and laboratory) are listed below for reference.    Significant Diagnostic Studies: Dg Chest Portable 1 View  03/13/2012  *RADIOLOGY REPORT*  Clinical Data: Chest pain  PORTABLE CHEST - 1 VIEW  Comparison: 03/17/2010  Findings: The heart size and mediastinal contours are within normal limits.  Both lungs are clear.  The visualized skeletal structures are unremarkable.  IMPRESSION: Negative exam.   Original Report Authenticated By: Rosealee Albee, M.D.     Microbiology: No results found for this or any previous visit (from the past 240 hour(s)).   Labs: Basic Metabolic Panel:  Lab 03/14/12 4098 03/13/12 0440  NA 138 137  K 3.8 4.4  CL 104 102  CO2 24 24  GLUCOSE 90 104*  BUN 18 8  CREATININE 0.81 0.64  CALCIUM 9.1 9.6  MG -- --  PHOS -- --   Liver Function Tests: No results found for this basename: AST:5,ALT:5,ALKPHOS:5,BILITOT:5,PROT:5,ALBUMIN:5 in the last 168 hours No results found for this basename: LIPASE:5,AMYLASE:5 in the last 168 hours No results found for this basename: AMMONIA:5 in the last 168 hours CBC:  Lab 03/14/12 0422 03/13/12 0440  WBC 5.0 5.0  NEUTROABS -- 1.6*  HGB 11.4* 12.3  HCT 32.8* 34.6*  MCV 92.1 91.8  PLT 186 179   Cardiac Enzymes:  Lab 03/13/12 2155 03/13/12 1554 03/13/12 0825 03/13/12 0440  CKTOTAL 98 106 -- --  CKMB 2.1 2.4 -- --  CKMBINDEX -- -- -- --  TROPONINI <0.30 <0.30 <0.30 <0.30   BNP: BNP (last 3 results) No results found for this basename: PROBNP:3 in the last 8760 hours CBG: No results found for this basename: GLUCAP:5 in the last 168 hours  Time coordinating discharge: > 30  minutes  Signed:  Penny Pia  [facility] 03/14/2012, 9:19 AM

## 2012-03-23 ENCOUNTER — Ambulatory Visit: Payer: No Typology Code available for payment source | Admitting: Internal Medicine

## 2012-04-25 ENCOUNTER — Other Ambulatory Visit: Payer: Self-pay | Admitting: Internal Medicine

## 2012-07-05 ENCOUNTER — Emergency Department (HOSPITAL_COMMUNITY)
Admission: EM | Admit: 2012-07-05 | Discharge: 2012-07-05 | Disposition: A | Discharge status: Home / Self Care | Payer: No Typology Code available for payment source | Attending: Emergency Medicine | Admitting: Emergency Medicine

## 2012-07-05 ENCOUNTER — Encounter (HOSPITAL_COMMUNITY): Payer: Self-pay | Admitting: *Deleted

## 2012-07-05 ENCOUNTER — Emergency Department (HOSPITAL_COMMUNITY): Payer: No Typology Code available for payment source

## 2012-07-05 DIAGNOSIS — F411 Generalized anxiety disorder: Secondary | ICD-10-CM | POA: Insufficient documentation

## 2012-07-05 DIAGNOSIS — Z7982 Long term (current) use of aspirin: Secondary | ICD-10-CM | POA: Insufficient documentation

## 2012-07-05 DIAGNOSIS — Z8639 Personal history of other endocrine, nutritional and metabolic disease: Secondary | ICD-10-CM | POA: Insufficient documentation

## 2012-07-05 DIAGNOSIS — Z8679 Personal history of other diseases of the circulatory system: Secondary | ICD-10-CM | POA: Insufficient documentation

## 2012-07-05 DIAGNOSIS — E785 Hyperlipidemia, unspecified: Secondary | ICD-10-CM | POA: Insufficient documentation

## 2012-07-05 DIAGNOSIS — Z862 Personal history of diseases of the blood and blood-forming organs and certain disorders involving the immune mechanism: Secondary | ICD-10-CM | POA: Insufficient documentation

## 2012-07-05 DIAGNOSIS — I059 Rheumatic mitral valve disease, unspecified: Secondary | ICD-10-CM | POA: Insufficient documentation

## 2012-07-05 DIAGNOSIS — R42 Dizziness and giddiness: Secondary | ICD-10-CM | POA: Insufficient documentation

## 2012-07-05 DIAGNOSIS — I10 Essential (primary) hypertension: Secondary | ICD-10-CM | POA: Insufficient documentation

## 2012-07-05 DIAGNOSIS — Z8742 Personal history of other diseases of the female genital tract: Secondary | ICD-10-CM | POA: Insufficient documentation

## 2012-07-05 DIAGNOSIS — Z79899 Other long term (current) drug therapy: Secondary | ICD-10-CM | POA: Insufficient documentation

## 2012-07-05 DIAGNOSIS — Z8669 Personal history of other diseases of the nervous system and sense organs: Secondary | ICD-10-CM | POA: Insufficient documentation

## 2012-07-05 DIAGNOSIS — R002 Palpitations: Secondary | ICD-10-CM | POA: Insufficient documentation

## 2012-07-05 LAB — BASIC METABOLIC PANEL
BUN: 12 mg/dL (ref 6–23)
CO2: 24 mEq/L (ref 19–32)
Calcium: 9.4 mg/dL (ref 8.4–10.5)
Chloride: 102 mEq/L (ref 96–112)
Creatinine, Ser: 0.6 mg/dL (ref 0.50–1.10)
GFR calc Af Amer: >90 mL/min (ref >90)
GFR calc non Af Amer: 88 mL/min — ABNORMAL LOW (ref >90)
Glucose, Bld: 104 mg/dL — ABNORMAL HIGH (ref 70–99)
Potassium: 3.5 mEq/L (ref 3.5–5.1)
Sodium: 136 mEq/L (ref 135–145)

## 2012-07-05 LAB — POCT I-STAT TROPONIN I: Troponin i, poc: 0 ng/mL (ref 0.00–0.08)

## 2012-07-05 LAB — MAGNESIUM: Magnesium: 2.1 mg/dL (ref 1.5–2.5)

## 2012-07-05 LAB — CBC
HCT: 36.5 % (ref 36.0–46.0)
Hemoglobin: 13.1 g/dL (ref 12.0–15.0)
MCH: 33 pg (ref 26.0–34.0)
MCHC: 35.9 g/dL (ref 30.0–36.0)
MCV: 91.9 fL (ref 78.0–100.0)
Platelets: 196 10*3/uL (ref 150–400)
RBC: 3.97 MIL/uL (ref 3.87–5.11)
RDW: 13.2 % (ref 11.5–15.5)
WBC: 4.9 10*3/uL (ref 4.0–10.5)

## 2012-07-05 MED ORDER — METOPROLOL TARTRATE 25 MG PO TABS: ORAL_TABLET | ORAL | Status: DC

## 2012-07-05 NOTE — ED Notes (Signed)
Pt reports being woken up with palpitations at 0300, denies any SOB or lightheadedness or dizziness or cp at this time.  Pt reports that this happened in the past.  States that they can never figure out what is causing this.  Pt also has a cardiologist, and has had a 24 hour cardiac monitor and needs to turn it in.  Pt also reports having stress test done in the past which was negative.

## 2012-07-05 NOTE — ED Provider Notes (Signed)
History     CSN: [id number]  Arrival date & time 07/05/12  4098   First MD Initiated Contact with Patient 07/05/12 [phone]      Chief Complaint  Patient presents with  . Palpitations    (Consider location/radiation/quality/duration/timing/severity/associated sxs/prior treatment) Patient is a 77 y.o. female presenting with palpitations. The history is provided by the patient.  Palpitations  This is a recurrent problem. Pertinent negatives include no numbness, no chest pain, no abdominal pain, no nausea, no vomiting, no headaches, no back pain, no weakness and no shortness of breath.   patient's had palpitations. She states she feels her heart beating fast and feels years. She has episodes of bone. She seen a cardiologist for the same, she states that she wore a Holter monitor, but is not turned back into to the weather. No chest pain with it. The lightheadedness or dizziness. She states she has mitral valve prolapse. No weight loss.  Past Medical History  Diagnosis Date  . Palpitations   . HTN (hypertension)   . Macular degeneration (senile) of retina, unspecified   . Nontoxic uninodular goiter   . Diffuse cystic mastopathy   . Anxiety   . Hyperlipidemia   . History of colonoscopy   . AAA (abdominal aortic aneurysm)     Past Surgical History  Procedure Date  . Total knee arthroplasty     Family History  Problem Relation Age of Onset  . Colon cancer Neg Hx   . Cancer Neg Hx   . Heart disease Neg Hx   . Coronary artery disease Other     History  Substance Use Topics  . Smoking status: Never Smoker   . Smokeless tobacco: Never Used  . Alcohol Use: No    OB History    Grav Para Term Preterm Abortions TAB SAB Ect Mult Living                  Review of Systems  Constitutional: Negative for activity change and appetite change.  HENT: Negative for neck stiffness.   Eyes: Negative for pain.  Respiratory: Negative for chest tightness and shortness of breath.     Cardiovascular: Positive for palpitations. Negative for chest pain and leg swelling.  Gastrointestinal: Negative for nausea, vomiting, abdominal pain and diarrhea.  Genitourinary: Negative for flank pain.  Musculoskeletal: Negative for back pain.  Skin: Negative for rash.  Neurological: Negative for weakness, numbness and headaches.  Psychiatric/Behavioral: Negative for behavioral problems.    Allergies  Ibuprofen; Rosuvastatin; and Statins  Home Medications   Current Outpatient Rx  Name  Route  Sig  Dispense  Refill  . ASPIRIN EC 81 MG PO TBEC   Oral   Take 81 mg by mouth 2 (two) times daily.         Marland Kitchen CLONAZEPAM 0.5 MG PO TABS      TAKE 1 TABLET BY MOUTH TWICE A DAY AS NEEDED FOR ANXIETY   60 tablet   0   . LOSARTAN POTASSIUM-HCTZ 50-12.5 MG PO TABS   Oral   Take 1 tablet by mouth daily.         . MECLIZINE HCL 25 MG PO TABS      TAKE 1 TABLET BY MOUTH 3 TIMES A DAY AS NEEDED VERTIGO   50 tablet   0   . MULTIGEN 70 MG PO TABS   Oral   Take 1 tablet (70 mg total) by mouth daily.   30 each   6   .  SUPER HIGH VITAMINS/MINERALS PO TABS   Oral   Take 1 tablet by mouth daily.           Marland Kitchen METOPROLOL TARTRATE 25 MG PO TABS      25 mg in morning and 12.5mg  in evening   30 tablet   0     BP 112/72  Pulse 79  Temp 97.9 F (36.6 C) (Oral)  Resp 16  Wt 155 lb (70.308 kg)  SpO2 97%  Physical Exam  Nursing note and vitals reviewed. Constitutional: She is oriented to person, place, and time. She appears well-developed and well-nourished.  HENT:  Head: Normocephalic and atraumatic.  Eyes: EOM are normal. Pupils are equal, round, and reactive to light.  Neck: Normal range of motion. Neck supple.  Cardiovascular: Normal rate, regular rhythm and normal heart sounds.   No murmur heard. Pulmonary/Chest: Effort normal and breath sounds normal. No respiratory distress. She has no wheezes. She has no rales.  Abdominal: Soft. Bowel sounds are normal. She  exhibits no distension. There is no tenderness. There is no rebound and no guarding.  Musculoskeletal: Normal range of motion.  Neurological: She is alert and oriented to person, place, and time. No cranial nerve deficit.  Skin: Skin is warm and dry.  Psychiatric: She has a normal mood and affect. Her speech is normal.    ED Course  Procedures (including critical care time)  Labs Reviewed  BASIC METABOLIC PANEL - Abnormal; Notable for the following:    Glucose, Bld 104 (*)     GFR calc non Af Amer 88 (*)     All other components within normal limits  CBC  MAGNESIUM  POCT I-STAT TROPONIN I   Dg Chest 2 View  07/05/2012  *RADIOLOGY REPORT*  Clinical Data: Palpitations  CHEST - 2 VIEW  Comparison: 03/13/2012  Findings: Lungs are clear. No pleural effusion or pneumothorax.  Cardiomediastinal silhouette is within normal limits.  Mild degenerative changes of the visualized thoracolumbar spine.  IMPRESSION: No evidence of acute cardiopulmonary disease.   Original Report Authenticated By: Charline Bills, M.D.      1. Heart palpitations      Date: 07/05/2012  Rate: 80  Rhythm: normal sinus rhythm  QRS Axis: normal  Intervals: normal  ST/T Wave abnormalities: normal  Conduction Disutrbances:none  Narrative Interpretation:   Old EKG Reviewed: unchanged    MDM  Patient with palpitations. Has seen cardiology for same it has not turned in a Holter monitor yet. She is sinus here. Metoprolol will be increased she'll followup with cardiology.        Juliet Rude. Rubin Payor, MD 07/05/12 1118

## 2012-07-05 NOTE — ED Notes (Signed)
Pt from home with reports of being awakened by heart palpitations that started at 0300 this morning. Pt reports feeling pounding in her chest and ears during episode. Pt denies presyncopal feelings, N/V/D or shortness of breath. Pt endorses hx of same and sees Dr. Jacinto Halim (Cardiologist). Pt reports wearing a Holter monitor last week for 2 days but due to weather has not returned to MD office. Pt also reports having an EKG done last week.

## 2012-12-05 ENCOUNTER — Other Ambulatory Visit: Payer: Self-pay

## 2012-12-05 DIAGNOSIS — Z1231 Encounter for screening mammogram for malignant neoplasm of breast: Secondary | ICD-10-CM

## 2012-12-26 ENCOUNTER — Encounter: Payer: Self-pay | Admitting: Endocrinology

## 2012-12-26 ENCOUNTER — Ambulatory Visit (INDEPENDENT_AMBULATORY_CARE_PROVIDER_SITE_OTHER): Payer: No Typology Code available for payment source | Admitting: Endocrinology

## 2012-12-26 VITALS — BP 110/60 | HR 76 | Temp 98.5°F | Ht 64.5 in | Wt 162.1 lb

## 2012-12-26 DIAGNOSIS — E042 Nontoxic multinodular goiter: Secondary | ICD-10-CM

## 2012-12-26 DIAGNOSIS — E049 Nontoxic goiter, unspecified: Secondary | ICD-10-CM

## 2012-12-26 NOTE — Patient Instructions (Addendum)
Will schedule for ultrasound and discuss results when available

## 2012-12-26 NOTE — Progress Notes (Signed)
History of Present Illness:  Chief complaint: Discomfort in the right neck area  The patient has had a multinodular goiter probably diagnosed initially in 2009 as per chart review. She is here as a self-referral. She thinks the nodules were diagnosed by her primary care physician on a routine exam and she was not aware of any thyroid swelling at that time. According to the patient she has been feeling a lump in her throat for the last 2-3 years and more recently had some discomfort on the right side. Does not think she has noticed a swelling herself. However has no difficulty swallowing or any choking sensation. She has had serial ultrasounds done over the last 4 years as below. She has not had any treatment or part supplement in the past. Has been followed by her PCP  PREVIOUS ultrasound reports:   THYROID ULTRASOUND 2013   Comparison: 10/07/2010  Findings:  Right thyroid lobe: 14 x 17 x 54 mm, inhomogeneous background  echotexture  Left thyroid lobe: 22 x 22 x 53 mm  Isthmus: 9.2 mm in thickness   Focal nodules: 9 x 13 x 13 mm solid, with calcification, mid-left  (previously 9 x 12 x 15)   10 x 17 x 23 mm solid, left isthmus (previously 9 x 17 x 23)  Lymphadenopathy: None visualized.   IMPRESSION:   Stable thyroid nodules.   Ultrasound 2011: Ultrasound from [facility] dated 09/11/2009 which  revealed a left isthmus nodule 1.9 x 1.8 cm and a left upper  thyroid nodule with coarse calcifications measuring 1.5 x 1.3    Biopsy 2011: Successful fine needle aspirate of left isthmus and left upper pole  thyroid nodules as described above.  REPORTS of previous thyroid biopsy:  FINAL DIAGNOSIS  Microscopic Examination and Diagnosis STATEMENT Of SPECIMEN ADEQUACY: Satisfactory for evaluation.  INTERPRETATION(S): THYROID, NEEDLE ASPIRATION, ISTHMUS: FINDINGS CONSISTENT WITH THYROIDITIS. SEE COMMENT. Comment: There are numerous lymphocytes and scant benign follicular  epithelium. The findings suggest thyroiditis or origin from a lymph node. (Jp:Jy) 10/02/09  Biopsy #2:  Date Taken: 10/01/2009  Date Received: 10/02/2009   FINAL DIAGNOSIS  Microscopic Examination and Diagnosis  STATEMENT Of SPECIMEN ADEQUACY:  Satisfactory for evaluation.   INTERPRETATION(S):  THYROID, FINE NEEDLE ASPIRATION, LEFT: BLOOD, COLLOID AND NUMEROUS LYMPHOCYTES.  Comment: There are numerous lymphocytes and colloid and the findings suggest  thyroiditis.        Medication List       This list is accurate as of: 12/26/12  9:29 PM.  Always use your most recent med list.               aspirin EC 81 MG tablet  Take 81 mg by mouth 2 (two) times daily.     clonazePAM 0.5 MG tablet  Commonly known as:  KLONOPIN  TAKE 1 TABLET BY MOUTH TWICE A DAY AS NEEDED FOR ANXIETY     losartan-hydrochlorothiazide 50-12.5 MG per tablet  Commonly known as:  HYZAAR  Take 1 tablet by mouth daily.     meclizine 25 MG tablet  Commonly known as:  ANTIVERT  TAKE 1 TABLET BY MOUTH 3 TIMES A DAY AS NEEDED VERTIGO     metoprolol tartrate 25 MG tablet  Commonly known as:  LOPRESSOR  25 mg in morning and 12.5mg  in evening     mineral/vitamin supplement 70 MG Tabs  Take 1 tablet (70 mg total) by mouth daily.     multivitamin,tx-minerals tablet  Take 1 tablet by mouth daily.  Allergies:  Allergies  Allergen Reactions  . Ibuprofen     REACTION: Blurry vision  . Rosuvastatin     REACTION: muscle aches  . Statins     Past Medical History  Diagnosis Date  . Palpitations   . HTN (hypertension)   . Macular degeneration (senile) of retina, unspecified   . Nontoxic uninodular goiter   . Diffuse cystic mastopathy   . Anxiety   . Hyperlipidemia   . History of colonoscopy   . AAA (abdominal aortic aneurysm)     Past Surgical History  Procedure Laterality Date  . Total knee arthroplasty      Family History  Problem Relation Age of Onset  . Colon cancer Neg Hx   .  Cancer Neg Hx   . Heart disease Neg Hx   . Coronary artery disease Other     Social History:  reports that she has never smoked. She has never used smokeless tobacco. She reports that she does not drink alcohol or use illicit drugs.  Review of Systems  Constitutional: Negative for malaise/fatigue.  Eyes: Negative for blurred vision.  Cardiovascular: Negative for leg swelling.  Musculoskeletal: Positive for joint pain.       She has shoulder pain bilaterally  Endo/Heme/Allergies:       High chol, no DM  She has been treated by her PCP for hypertension  BP 110/60  Pulse 76  Temp(Src) 98.5 F (36.9 C) (Oral)  Ht 5' 4.5" (1.638 m)  Wt 162 lb 1 oz (73.511 kg)  BMI 27.4 kg/m2  SpO2 95%  Physical Exam  Constitutional: She appears well-developed and well-nourished.  Neck: Thyromegaly present.  Thyroid is enlarged about 2-2-1/2 times normal and smooth on both sides, slightly firm. Left lobe is about 3 cm without any distinct nodule; thyroid isthmus is also palpable and firm. There is no tenderness over the thyroid. No lymphadenopathy in the neck  Cardiovascular: Normal rate, regular rhythm and normal heart sounds.   Pulmonary/Chest: Breath sounds normal. She has no rales.  Abdominal: She exhibits no distension.  No hepatosplenomegaly or mass, no tenderness  Musculoskeletal:  Unable to raise her arms above the shoulder level  Skin: Skin is warm and dry.     Assessment/Plan:   She appears to have a multinodular goiter with nodules mostly on the left side present since at least 2009. The nodules on the left side were benign on previous biopsy and there was no change in nodules between 2012 and 2013 on the ultrasound. However she does seem to have palpable right lobe now and she is complaining of local discomfort. Pathology reports indicate that she probably has lymphocytic thyroiditis indicating Hashimoto's thyroiditis rather than thyroid adenoma  Discussed with the patient the  nature of multinodular goiter, thyroid nodules and benign nature of her thyroid nodules. She is however still wanting to get another ultrasound done because of her right-sided discomfort. She did not have a nodule on the right side but only diffuse enlargement. Will also need to evaluate thyroid function and thyroid antibodies to rule out Hashimoto thyroiditis Ultrasound of the thyroid will be ordered and discussed with patient. Explained to her that it thyroid nodules are unchanged in size and she had no new nodules should not need any further ultrasound followup She will followup clinically in 6 months   Addendum: Labs available  Office Visit on 12/26/2012  Component Date Value Range Status  . TSH 12/26/2012 0.55  0.35 - 5.50 uIU/mL Final  . Free  T4 12/26/2012 0.82  0.60 - 1.60 ng/dL Final  . Thyroid Peroxidase Antibody 12/26/2012 22.1  <35.0 IU/mL Final   Comment:                            The thyroid microsomal antigen has been shown to be Thyroid                          Peroxidase (TPO).  This assay detects anti-TPO antibodies.

## 2012-12-27 LAB — T4, FREE: Free T4: 0.82 ng/dL (ref 0.60–1.60)

## 2012-12-27 LAB — TSH: TSH: 0.55 u[IU]/mL (ref 0.35–5.50)

## 2012-12-27 LAB — THYROID PEROXIDASE ANTIBODY: Thyroperoxidase Ab SerPl-aCnc: 22.1 IU/mL (ref <35.0)

## 2013-01-17 ENCOUNTER — Telehealth: Payer: Self-pay | Admitting: *Deleted

## 2013-01-17 DIAGNOSIS — E042 Nontoxic multinodular goiter: Secondary | ICD-10-CM

## 2013-01-17 NOTE — Telephone Encounter (Signed)
Pt saw you on the 7th of July, she said someone would be calling her about her Thyroid, (referral)?  No one has called her yet and she wanted to see what was going on?

## 2013-01-18 ENCOUNTER — Ambulatory Visit
Admission: RE | Admit: 2013-01-18 | Discharge: 2013-01-18 | Disposition: A | Discharge status: Home / Self Care | Payer: No Typology Code available for payment source | Source: Ambulatory Visit

## 2013-01-18 ENCOUNTER — Telehealth: Payer: Self-pay | Admitting: *Deleted

## 2013-01-18 DIAGNOSIS — Z1231 Encounter for screening mammogram for malignant neoplasm of breast: Secondary | ICD-10-CM

## 2013-01-18 NOTE — Telephone Encounter (Signed)
This referral was sent, please find out why this was not scheduled for a thyroid ultrasound

## 2013-01-19 ENCOUNTER — Telehealth: Payer: Self-pay | Admitting: *Deleted

## 2013-01-19 NOTE — Telephone Encounter (Signed)
Left message for pt to return call.

## 2013-01-19 NOTE — Telephone Encounter (Signed)
Pt is calling about her Thyroid Ultrasound referral, she was seen by Dr. Lucianne Muss on the 7th and the referral was made on that day.  Please call Pt at [phone]

## 2013-01-19 NOTE — Telephone Encounter (Signed)
There is a note in order where someone from [facility] tried to schedule appt and dtr wanted to wait. I  Called [facility] to call  And ansked them to call pt and schedule.

## 2013-01-24 ENCOUNTER — Ambulatory Visit
Admission: RE | Admit: 2013-01-24 | Discharge: 2013-01-24 | Disposition: A | Discharge status: Home / Self Care | Payer: No Typology Code available for payment source | Source: Ambulatory Visit | Attending: Endocrinology | Admitting: Endocrinology

## 2013-01-24 DIAGNOSIS — E049 Nontoxic goiter, unspecified: Secondary | ICD-10-CM

## 2013-01-26 ENCOUNTER — Telehealth: Payer: Self-pay | Admitting: *Deleted

## 2013-01-26 NOTE — Telephone Encounter (Signed)
Left message to return call 

## 2013-01-26 NOTE — Telephone Encounter (Signed)
Message copied by Hermenia Bers on Thu Jan 26, 2013  8:59 AM ------      Message from: Reather Littler      Created: Wed Jan 25, 2013  3:41 PM       The ultrasound shows no change compared to a year ago and do not think she will need more ultrasound exams, followup in 6 months ------

## 2013-01-26 NOTE — Telephone Encounter (Signed)
Message copied by Hermenia Bers on Thu Jan 26, 2013  9:11 AM ------      Message from: [name]      Created: Wed Jan 25, 2013  3:41 PM       The ultrasound shows no change compared to a year ago and do not think she will need more ultrasound exams, followup in 6 months ------

## 2013-01-26 NOTE — Telephone Encounter (Signed)
Pt is aware of results. 

## 2013-02-01 ENCOUNTER — Other Ambulatory Visit: Payer: Self-pay | Admitting: Cardiology

## 2013-02-01 DIAGNOSIS — I712 Thoracic aortic aneurysm, without rupture: Secondary | ICD-10-CM

## 2013-02-02 ENCOUNTER — Ambulatory Visit
Admission: RE | Admit: 2013-02-02 | Discharge: 2013-02-02 | Disposition: A | Discharge status: Home / Self Care | Payer: No Typology Code available for payment source | Source: Ambulatory Visit | Attending: Cardiology | Admitting: Cardiology

## 2013-02-02 DIAGNOSIS — I712 Thoracic aortic aneurysm, without rupture: Secondary | ICD-10-CM

## 2013-02-02 MED ORDER — IOHEXOL 350 MG/ML SOLN: 100.0000 mL | Freq: Once | INTRAVENOUS | Status: AC | PRN

## 2013-06-28 ENCOUNTER — Ambulatory Visit: Payer: No Typology Code available for payment source | Admitting: Endocrinology

## 2013-09-14 ENCOUNTER — Other Ambulatory Visit (HOSPITAL_COMMUNITY): Payer: Self-pay | Admitting: Internal Medicine

## 2013-09-14 DIAGNOSIS — E041 Nontoxic single thyroid nodule: Secondary | ICD-10-CM

## 2013-09-14 DIAGNOSIS — E010 Iodine-deficiency related diffuse (endemic) goiter: Secondary | ICD-10-CM

## 2013-09-21 ENCOUNTER — Ambulatory Visit (HOSPITAL_COMMUNITY): Payer: Medicaid Other

## 2013-09-26 ENCOUNTER — Ambulatory Visit (HOSPITAL_COMMUNITY)
Admission: RE | Admit: 2013-09-26 | Discharge: 2013-09-26 | Disposition: A | Discharge status: Home / Self Care | Payer: Medicare Other | Source: Ambulatory Visit | Attending: Internal Medicine | Admitting: Internal Medicine

## 2013-09-26 DIAGNOSIS — E042 Nontoxic multinodular goiter: Secondary | ICD-10-CM | POA: Insufficient documentation

## 2013-09-26 DIAGNOSIS — E010 Iodine-deficiency related diffuse (endemic) goiter: Secondary | ICD-10-CM

## 2013-09-26 DIAGNOSIS — R599 Enlarged lymph nodes, unspecified: Secondary | ICD-10-CM | POA: Insufficient documentation

## 2013-11-15 ENCOUNTER — Encounter: Payer: Self-pay | Admitting: Dietician

## 2013-11-15 ENCOUNTER — Encounter: Payer: Medicare Other | Attending: Internal Medicine | Admitting: Dietician

## 2013-11-15 VITALS — Ht 66.0 in | Wt 153.5 lb

## 2013-11-15 DIAGNOSIS — R7303 Prediabetes: Secondary | ICD-10-CM

## 2013-11-15 DIAGNOSIS — E785 Hyperlipidemia, unspecified: Secondary | ICD-10-CM

## 2013-11-15 DIAGNOSIS — I10 Essential (primary) hypertension: Secondary | ICD-10-CM

## 2013-11-15 DIAGNOSIS — R7309 Other abnormal glucose: Secondary | ICD-10-CM

## 2013-11-15 DIAGNOSIS — Z713 Dietary counseling and surveillance: Secondary | ICD-10-CM | POA: Insufficient documentation

## 2013-11-15 NOTE — Progress Notes (Signed)
Appt start time: 1100 end time:  1200.  Assessment:  Patient was seen on 11/15/13 for individual diabetes education. Pt reports with recent dx of pre-diabetes and has been extremely concerned about what she is eating. She admits she has been skipping foods altogether out of concern about how it will affect her health.  Current HbA1c: 6.0  Preferred Learning Style:   No preference indicated   Learning Readiness:   Ready  MEDICATIONS: see list.  DIETARY INTAKE: Usual eating pattern includes 2-3 meals and 1-2 snacks per day. Everyday foods include ginger tea, smoothie, chix drumstick.  Avoided foods include lately all items with starch, especially rice.    24-hr recall:  B ( AM): one chix drumstick w/o skin, ginger tea, cup of spinach strawberry, and half apple shake.  Snk ( AM): none  L ( PM): none. Sometimes will have chix and veg soup or similar meal to dinner, just smaller. Snk ( PM): none D ( PM): 2 chix drumsticks, 1 cup of almond or soy milk, tomato, carrots, broccoli, celery Snk ( PM): one cup oatmeal with some raisins and cashews Beverages: ginger tea, water, no soda, no juice, no EtOH, almond or soy milk with dinner  Usual physical activity: walks up and down steps in house, but minimal formal exercise  Progress Towards Goal(s):  In progress.   Nutritional Diagnosis:  NB-1.1 Food and nutrition-related knowledge deficit As related to appropriate meal pattern for BG control, type 2 DM prevention.  As evidenced by lack of prior education, pt concerns over many foods and how they affect BG.    Intervention:  Nutrition counseling provided. Misconceptions and misunderstanding regarding diabetes pathology and diet explained. Importance of exercise in prevention and treatment highlighted.  Teaching Method Utilized:  Visual Auditory  Handouts given during visit include:  The Plate Method  Best Protein, Fat, and Carb rich foods for heart health  Barriers to  learning/adherence to lifestyle change: some misconceptions about how diabetes develops, how different foods affect blood sugar  Diabetes self-care support plan:   [facility] support group  Ms. Domine's Meal Plan:  Use the Plate Method for 2-3 meals- Half non-starchy vegetables,  starch,  protein foods. 3 total pieces of fruit per day, about the size of your fist for each piece 2 glasses of milk or yogurt per day  Exercise- begin with 30 minutes per day. Increase every 2 weeks by 5 minutes per day until you get to 60 minutes per day. Good options include walking, jogging, swimming, aerobics classes, yoga, pilates, and many more. Your goal is to work hard enough to make your breathing a bit labored but not extremely difficult.  Demonstrated degree of understanding via:  Teach Back   Monitoring/Evaluation:  Dietary intake, exercise, portion control, and body weight in 2 month(s).

## 2013-12-11 ENCOUNTER — Other Ambulatory Visit: Payer: Self-pay | Admitting: Internal Medicine

## 2013-12-11 DIAGNOSIS — Z1231 Encounter for screening mammogram for malignant neoplasm of breast: Secondary | ICD-10-CM

## 2014-01-16 ENCOUNTER — Encounter: Payer: Medicare Other | Attending: Internal Medicine | Admitting: *Deleted

## 2014-01-16 ENCOUNTER — Encounter: Payer: Self-pay | Admitting: *Deleted

## 2014-01-16 VITALS — Ht 66.0 in | Wt 149.0 lb

## 2014-01-16 DIAGNOSIS — R7303 Prediabetes: Secondary | ICD-10-CM

## 2014-01-16 DIAGNOSIS — I10 Essential (primary) hypertension: Secondary | ICD-10-CM

## 2014-01-16 DIAGNOSIS — R7309 Other abnormal glucose: Secondary | ICD-10-CM | POA: Insufficient documentation

## 2014-01-16 DIAGNOSIS — Z713 Dietary counseling and surveillance: Secondary | ICD-10-CM | POA: Insufficient documentation

## 2014-01-16 NOTE — Patient Instructions (Signed)
Plan:  Aim for 2 Carb Choices per meal (30 grams) +/- 1 either way  Aim for 0-1 Carbs per snack if hungry  Include protein in moderation with your meals and snacks Consider reading food labels for Total Carbohydrate of foods Continue with your activity level daily as tolerated Continue checking your BG daily, you now have the target goals to aim for.

## 2014-01-16 NOTE — Progress Notes (Signed)
Appt start time: 1100 end time:  1130.  Assessment:  Patient was seen on 01/16/14 for individual diabetes education follow up visit. Patient worked with [employer], RD at last visit who no longer works here, so I am seeing her today. She is concerned about continued weight loss, wants to gain a few pounds back, goal weight of 164 pounds. She now has a meter to check her BG, she is testing every AM with reported range of 81-101 mg/dl fasting. She is still afraid of having pre-diabetes and requests more of a plan for her food choices today.  Current HbA1c: 6.0  Preferred Learning Style:   No preference indicated   Learning Readiness:   Ready  MEDICATIONS: see list.  DIETARY INTAKE: Usual eating pattern includes 2-3 meals and 1-2 snacks per day. Everyday foods include ginger tea, smoothie, chix drumstick.  Continues to avoid foods include lately all items with starch, especially rice, though she does include fruit at most meals.    24-hr recall:  B ( AM): one chix drumstick w/o skin, ginger tea, cup of spinach strawberry, and half apple shake.  Snk ( AM): none  L ( PM): none. Sometimes will have chix and veg soup or similar meal to dinner, just smaller. Snk ( PM): none D ( PM): 2 chix drumsticks, 1 cup of almond or soy milk, tomato, carrots, broccoli, celery Snk ( PM): one cup oatmeal with some raisins and cashews Beverages: ginger tea, water, no soda, no juice, no EtOH, almond or soy milk with dinner  Usual physical activity: walks up and down steps in house, but minimal formal exercise  Progress Towards Goal(s):  In progress.   Nutritional Diagnosis:  NB-1.1 Food and nutrition-related knowledge deficit As related to appropriate meal pattern for BG control, type 2 DM prevention.  As evidenced by lack of prior education, pt concerns over many foods and how they affect BG.    Intervention:  Nutrition counseling continued. Discussed carb counting in terms of food groups and  reminded her that she needs some carbohydrate at each meal. Provided additional instruction on carb counting today, more specific than The Plate Method. Encouraged her to have moderate carb choices per meal and snacks. Answered questions regarding using Glucerna occasionally. Provided BG goals for Pre-diabetes since she is testing now.  Plan:  Aim for 2 Carb Choices per meal (30 grams) +/- 1 either way  Aim for 0-1 Carbs per snack if hungry  Include protein in moderation with your meals and snacks Consider reading food labels for Total Carbohydrate of foods Continue with your activity level daily as tolerated Continue checking your BG daily, you now have the target goals to aim for.   Teaching Method Utilized:  Visual Auditory  Handouts given during visit include:  Carb Counting and Food Label handouts  Barriers to learning/adherence to lifestyle change: some misconceptions about how diabetes develops, how different foods affect blood sugar  Diabetes self-care support plan:   [facility] support group  Demonstrated degree of understanding via:  Teach Back   Monitoring/Evaluation:  Dietary intake, exercise, portion control, SMBG, and body weight in 2 month(s).

## 2014-01-22 ENCOUNTER — Ambulatory Visit: Payer: Medicare Other

## 2014-02-07 ENCOUNTER — Ambulatory Visit
Admission: RE | Admit: 2014-02-07 | Discharge: 2014-02-07 | Disposition: A | Discharge status: Home / Self Care | Payer: Medicare Other | Source: Ambulatory Visit | Attending: Internal Medicine | Admitting: Internal Medicine

## 2014-02-07 DIAGNOSIS — Z1231 Encounter for screening mammogram for malignant neoplasm of breast: Secondary | ICD-10-CM

## 2014-03-01 ENCOUNTER — Encounter: Payer: Medicare Other | Attending: Internal Medicine | Admitting: *Deleted

## 2014-03-01 VITALS — Ht 66.0 in | Wt 145.5 lb

## 2014-03-01 DIAGNOSIS — R7309 Other abnormal glucose: Secondary | ICD-10-CM | POA: Diagnosis not present

## 2014-03-01 DIAGNOSIS — Z713 Dietary counseling and surveillance: Secondary | ICD-10-CM | POA: Diagnosis present

## 2014-03-01 DIAGNOSIS — I10 Essential (primary) hypertension: Secondary | ICD-10-CM

## 2014-03-01 DIAGNOSIS — R7303 Prediabetes: Secondary | ICD-10-CM

## 2014-03-01 NOTE — Progress Notes (Signed)
Appt start time: 1100 end time:  1130.  Assessment:  Patient was seen on 01/16/14 for individual diabetes education follow up visit. She brought her Log Book today  with recorded FBG all under 101 mg/dl. She continues to be afraid of eating for fear of BG going too high. She continues to be concerned over continued weight loss too. Weight loss today of 4 pounds since the end of July noted. She also states she is afraid of getting Diabetes because of the complications.   Current HbA1c: 6.0  Preferred Learning Style:   No preference indicated   Learning Readiness:   Ready  MEDICATIONS: see list.  DIETARY INTAKE: Usual eating pattern includes 2-3 meals and 1-2 snacks per day. Everyday foods include ginger tea, smoothie, chix drumstick.  Continues to avoid foods include lately all items with starch, especially rice, though she does include fruit at most meals.    24-hr recall:  B ( AM): one chix drumstick w/o skin, ginger tea, half apple with PNB,  OR oatmeal with 1 cup milk and protein.  Snk ( AM): none  L ( PM): none. Sometimes will have chix and veg soup or similar meal to dinner, just smaller. Snk ( PM): none D ( PM): 1-2 chix drumsticks, 1 cup of almond or soy milk, tomato, carrots, broccoli, celery, apple with PNB Snk ( PM): none anymore Beverages: ginger tea, water, no soda, no juice, almond or soy milk with dinner  Usual physical activity: walks up and down steps in house, but minimal formal exercise  Progress Towards Goal(s):  In progress.   Nutritional Diagnosis:  NB-1.1 Food and nutrition-related knowledge deficit As related to appropriate meal pattern for BG control, type 2 DM prevention.  As evidenced by lack of prior education, pt concerns over many foods and how they affect BG.    Intervention:  Nutrition counseling continued. Reviewed target ranges pre and post meal and reinforced that she is well within these ranges. Also reviewed importance of eating adequate calories  for weight maintenance, if not, mild weight gain. Reviewed Carb Counting and assessed that she is not eating enough Carbs for goal. Encouraged her to increase her plant fat intake as another way to increase calories while having less affect on her BG.  Plan:  Aim for at least 2 Carb Choices per meal (30 grams) +/- 1 either way  Aim for 0-1 Carbs per snack if hungry  Include protein in moderation with your meals and snacks Consider some extra salad dressing, avocado and oils to food to increase calories without affect BG very much Consider reading food labels for Total Carbohydrate of foods Continue with your activity level daily as tolerated Continue checking your BG daily, you now have the target goals to aim for.   Teaching Method Utilized:  Visual Auditory  Handouts given during visit include:  Carb Counting and Food Label handouts  Barriers to learning/adherence to lifestyle change: some misconceptions about how diabetes develops, how different foods affect blood sugar  Diabetes self-care support plan:   [facility] support group  Demonstrated degree of understanding via:  Teach Back   Monitoring/Evaluation:  Dietary intake, exercise, portion control, SMBG, and body weight in 2 month(s).

## 2014-03-01 NOTE — Patient Instructions (Signed)
Plan:  Aim for 2 Carb Choices per meal (30 grams) +/- 1 either way  Aim for 0-1 Carbs per snack if hungry  Include protein in moderation with your meals and snacks Consider some extra salad dressing, avocado and oils to food to increase calories without affect BG very much Consider reading food labels for Total Carbohydrate of foods Continue with your activity level daily as tolerated Continue checking your BG daily, you now have the target goals to aim for.

## 2014-05-01 ENCOUNTER — Encounter: Payer: Medicare Other | Attending: Internal Medicine | Admitting: *Deleted

## 2014-05-01 VITALS — Ht 66.0 in | Wt 150.9 lb

## 2014-05-01 DIAGNOSIS — R7303 Prediabetes: Secondary | ICD-10-CM

## 2014-05-01 DIAGNOSIS — R7309 Other abnormal glucose: Secondary | ICD-10-CM | POA: Insufficient documentation

## 2014-05-01 DIAGNOSIS — Z713 Dietary counseling and surveillance: Secondary | ICD-10-CM | POA: Insufficient documentation

## 2014-05-01 NOTE — Progress Notes (Signed)
Appt start time: 1100 end time:  1130.  Assessment:  Patient was seen on 05/01/14 for individual pre-diabetes education follow up visit. Due to her history of concern over continued weight loss she is pleased with mild weight gain to 150 # today. She still expresses concern over getting Diabetes but was willing to review the risk factors and diagnosis criteria today.  Current HbA1c: 6.0, now down to 5.6%  Preferred Learning Style:   No preference indicated   Learning Readiness:   Ready  MEDICATIONS: see list.  DIETARY INTAKE: Usual eating pattern includes 2-3 meals and 1-2 snacks per day. Everyday foods include ginger tea, smoothie, chix drumstick.  Continues to avoid foods include lately all items with starch, especially rice, though she does include fruit at most meals.    24-hr recall:  B ( AM): one chix drumstick w/o skin, ginger tea, half apple with PNB,  OR oatmeal with 1 cup milk and protein.  Snk ( AM): none  L ( PM): none. Sometimes will have chix and veg soup or similar meal to dinner, just smaller. Snk ( PM): none D ( PM): 1-2 chix drumsticks, 1 cup of almond or soy milk, tomato, carrots, broccoli, celery, apple with PNB Snk ( PM): none anymore Beverages: ginger tea, water, no soda, no juice, almond or soy milk with dinner  Usual physical activity: walks up and down steps in house throughout the day  Progress Towards Goal(s):  In progress.   Nutritional Diagnosis:  NB-1.1 Food and nutrition-related knowledge deficit As related to appropriate meal pattern for BG control, type 2 DM prevention.  As evidenced by lack of prior education, pt concerns over many foods and how they affect BG.    Intervention:  Nutrition counseling continued. Reviewed target ranges pre and post meal and reinforced that she is well within these ranges. Also reviewed criteria for A1c as diagnosis tool for Pre-diabetes. Explained that her A1c is now under the diagnosis range of 5.7 - 6.4%!   Encouraged her to increase her plant fat intake as another way to increase calories while having less affect on her BG.   Plan:  Continue to aim for 2 Carb Choices per meal (30 grams) +/- 1 either way  Continue to aim for 0-1 Carbs per snack if hungry  Include protein in moderation with your meals and snacks Consider some extra salad dressing, avocado and oils to food to increase calories without affect BG very much Continuer reading food labels for Total Carbohydrate of foods Continue with your activity level daily as tolerated Continue checking your BG daily, you now have the target goals to aim for.   Teaching Method Utilized:  Visual Auditory  Handouts given during visit include:  No new handouts today  Barriers to learning/adherence to lifestyle change: some misconceptions about how diabetes develops, how different foods affect blood sugar  Diabetes self-care support plan:   [facility] support group  Demonstrated degree of understanding via:  Teach Back   Monitoring/Evaluation:  Dietary intake, exercise, portion control, SMBG, and body weight in 3 month(s).

## 2014-05-11 NOTE — Patient Instructions (Signed)
Plan:  Continue to aim for 2 Carb Choices per meal (30 grams) +/- 1 either way  Continue to aim for 0-1 Carbs per snack if hungry  Include protein in moderation with your meals and snacks Consider some extra salad dressing, avocado and oils to food to increase calories without affect BG very much Continuer reading food labels for Total Carbohydrate of foods Continue with your activity level daily as tolerated Continue checking your BG daily, you now have the target goals to aim for.

## 2014-08-02 ENCOUNTER — Ambulatory Visit: Payer: Medicare Other | Admitting: *Deleted

## 2014-09-08 ENCOUNTER — Emergency Department (INDEPENDENT_AMBULATORY_CARE_PROVIDER_SITE_OTHER): Payer: Medicare Other

## 2014-09-08 ENCOUNTER — Encounter (HOSPITAL_COMMUNITY): Payer: Self-pay | Admitting: *Deleted

## 2014-09-08 ENCOUNTER — Emergency Department (INDEPENDENT_AMBULATORY_CARE_PROVIDER_SITE_OTHER)
Admission: EM | Admit: 2014-09-08 | Discharge: 2014-09-08 | Disposition: A | Discharge status: Home / Self Care | Payer: Medicare Other | Source: Home / Self Care | Attending: Family Medicine | Admitting: Family Medicine

## 2014-09-08 DIAGNOSIS — W19XXXA Unspecified fall, initial encounter: Secondary | ICD-10-CM

## 2014-09-08 DIAGNOSIS — S20212A Contusion of left front wall of thorax, initial encounter: Secondary | ICD-10-CM | POA: Diagnosis not present

## 2014-09-08 NOTE — Discharge Instructions (Signed)
Chest Contusion A contusion is a deep bruise. Bruises happen when an injury causes bleeding under the skin. Signs of bruising include pain, puffiness (swelling), and discolored skin. The bruise may turn blue, purple, or yellow.  HOME CARE  Put ice on the injured area.  Put ice in a plastic bag.  Place a towel between the skin and the bag.  Leave the ice on for 15-20 minutes at a time, 03-04 times a day for the first 48 hours.  Only take medicine as told by your doctor.  Rest.  Take deep breaths (deep-breathing exercises) as told by your doctor.  Stop smoking if you smoke.  Do not lift objects over 5 pounds (2.3 kilograms) for 3 days or longer if told by your doctor. GET HELP RIGHT AWAY IF:   You have more bruising or puffiness.  You have pain that gets worse.  You have trouble breathing.  You are dizzy, weak, or pass out (faint).  You have blood in your pee (urine) or poop (stool).  You cough up or throw up (vomit) blood.  Your puffiness or pain is not helped with medicines. MAKE SURE YOU:   Understand these instructions.  Will watch your condition.  Will get help right away if you are not doing well or get worse. Document Released: 11/25/2007 Document Revised: 03/02/2012 Document Reviewed: 11/30/2011 [facility] Patient Information 2015 [place], [place]. This information is not intended to replace advice given to you by your health care provider. Make sure you discuss any questions you have with your health care provider.  Fall Prevention and Home Safety Falls cause injuries and can affect all age groups. It is possible to use preventive measures to significantly decrease the likelihood of falls. There are many simple measures which can make your home safer and prevent falls. OUTDOORS  Repair cracks and edges of walkways and driveways.  Remove high doorway thresholds.  Trim shrubbery on the main path into your home.  Have good outside lighting.  Clear walkways  of tools, rocks, debris, and clutter.  Check that handrails are not broken and are securely fastened. Both sides of steps should have handrails.  Have leaves, snow, and ice cleared regularly.  Use sand or salt on walkways during winter months.  In the garage, clean up grease or oil spills. BATHROOM  Install night lights.  Install grab bars by the toilet and in the tub and shower.  Use non-skid mats or decals in the tub or shower.  Place a plastic non-slip stool in the shower to sit on, if needed.  Keep floors dry and clean up all water on the floor immediately.  Remove soap buildup in the tub or shower on a regular basis.  Secure bath mats with non-slip, double-sided rug tape.  Remove throw rugs and tripping hazards from the floors. BEDROOMS  Install night lights.  Make sure a bedside light is easy to reach.  Do not use oversized bedding.  Keep a telephone by your bedside.  Have a firm chair with side arms to use for getting dressed.  Remove throw rugs and tripping hazards from the floor. KITCHEN  Keep handles on pots and pans turned toward the center of the stove. Use back burners when possible.  Clean up spills quickly and allow time for drying.  Avoid walking on wet floors.  Avoid hot utensils and knives.  Position shelves so they are not too high or low.  Place commonly used objects within easy reach.  If necessary, use  a sturdy step stool with a grab bar when reaching.  Keep electrical cables out of the way.  Do not use floor polish or wax that makes floors slippery. If you must use wax, use non-skid floor wax.  Remove throw rugs and tripping hazards from the floor. STAIRWAYS  Never leave objects on stairs.  Place handrails on both sides of stairways and use them. Fix any loose handrails. Make sure handrails on both sides of the stairways are as long as the stairs.  Check carpeting to make sure it is firmly attached along stairs. Make repairs to  worn or loose carpet promptly.  Avoid placing throw rugs at the top or bottom of stairways, or properly secure the rug with carpet tape to prevent slippage. Get rid of throw rugs, if possible.  Have an electrician put in a light switch at the top and bottom of the stairs. OTHER FALL PREVENTION TIPS  Wear low-heel or rubber-soled shoes that are supportive and fit well. Wear closed toe shoes.  When using a stepladder, make sure it is fully opened and both spreaders are firmly locked. Do not climb a closed stepladder.  Add color or contrast paint or tape to grab bars and handrails in your home. Place contrasting color strips on first and last steps.  Learn and use mobility aids as needed. Install an electrical emergency response system.  Turn on lights to avoid dark areas. Replace light bulbs that burn out immediately. Get light switches that glow.  Arrange furniture to create clear pathways. Keep furniture in the same place.  Firmly attach carpet with non-skid or double-sided tape.  Eliminate uneven floor surfaces.  Select a carpet pattern that does not visually hide the edge of steps.  Be aware of all pets. OTHER HOME SAFETY TIPS  Set the water temperature for 120 F (48.8 C).  Keep emergency numbers on or near the telephone.  Keep smoke detectors on every level of the home and near sleeping areas. Document Released: 05/29/2002 Document Revised: 12/08/2011 Document Reviewed: 08/28/2011 [facility] Patient Information 2015 [place], [place]. This information is not intended to replace advice given to you by your health care provider. Make sure you discuss any questions you have with your health care provider.  Rib Contusion A rib contusion (bruise) can occur by a blow to the chest or by a fall against a hard object. Usually these will be much better in a couple weeks. If X-rays were taken today and there are no broken bones (fractures), the diagnosis of bruising is made. However,  broken ribs may not show up for several days, or may be discovered later on a routine X-ray when signs of healing show up. If this happens to you, it does not mean that something was missed on the X-ray, but simply that it did not show up on the first X-rays. Earlier diagnosis will not usually change the treatment. HOME CARE INSTRUCTIONS   Avoid strenuous activity. Be careful during activities and avoid bumping the injured ribs. Activities that pull on the injured ribs and cause pain should be avoided, if possible.  For the first day or two, an ice pack used every 20 minutes while awake may be helpful. Put ice in a plastic bag and put a towel between the bag and the skin.  Eat a normal, well-balanced diet. Drink plenty of fluids to avoid constipation.  Take deep breaths several times a day to keep lungs free of infection. Try to cough several times a day. Splint  the injured area with a pillow while coughing to ease pain. Coughing can help prevent pneumonia.  Wear a rib belt or binder only if told to do so by your caregiver. If you are wearing a rib belt or binder, you must do the breathing exercises as directed by your caregiver. If not used properly, rib belts or binders restrict breathing which can lead to pneumonia.  Only take over-the-counter or prescription medicines for pain, discomfort, or fever as directed by your caregiver. SEEK MEDICAL CARE IF:   You or your child has an oral temperature above 102 F (38.9 C).  Your baby is older than 3 months with a rectal temperature of 100.5 F (38.1 C) or higher for more than 1 day.  You develop a cough, with thick or bloody sputum. SEEK IMMEDIATE MEDICAL CARE IF:   You have difficulty breathing.  You feel sick to your stomach (nausea), have vomiting or belly (abdominal) pain.  You have worsening pain, not controlled with medications, or there is a change in the location of the pain.  You develop sweating or radiation of the pain into the  arms, jaw or shoulders, or become light headed or faint.  You or your child has an oral temperature above 102 F (38.9 C), not controlled by medicine.  Your or your baby is older than 3 months with a rectal temperature of 102 F (38.9 C) or higher.  Your baby is 21 months old or younger with a rectal temperature of 100.4 F (38 C) or higher. MAKE SURE YOU:   Understand these instructions.  Will watch your condition.  Will get help right away if you are not doing well or get worse. Document Released: 03/03/2001 Document Revised: 10/03/2012 Document Reviewed: 01/25/2008 [facility] Patient Information 2015 [place], [place]. This information is not intended to replace advice given to you by your health care provider. Make sure you discuss any questions you have with your health care provider.

## 2014-09-08 NOTE — ED Provider Notes (Signed)
CSN: [id number]     Arrival date & time 09/08/14  0254 History   First MD Initiated Contact with Patient 09/08/14 1108     Chief Complaint  Patient presents with  . Fall   (Consider location/radiation/quality/duration/timing/severity/associated sxs/prior Treatment) HPI Comments: 79 year old female was working in her garden 2 days ago tripped and fell onto her left side. Her only complaint is that of pain to the left lateral chest wall. She denies injuring her head or neck or extremities. Denies shortness of breath, cough or fever. There is been no change in level of consciousness, confusion or disorientation or behavior according to significant other.   Past Medical History  Diagnosis Date  . Palpitations   . HTN (hypertension)   . Macular degeneration (senile) of retina, unspecified   . Nontoxic uninodular goiter   . Diffuse cystic mastopathy   . Anxiety   . Hyperlipidemia   . History of colonoscopy   . AAA (abdominal aortic aneurysm)   . Pre-diabetes    Past Surgical History  Procedure Laterality Date  . Total knee arthroplasty     Family History  Problem Relation Age of Onset  . Colon cancer Neg Hx   . Cancer Neg Hx   . Heart disease Neg Hx   . Coronary artery disease Other    History  Substance Use Topics  . Smoking status: Never Smoker   . Smokeless tobacco: Never Used  . Alcohol Use: No   OB History    No data available     Review of Systems  Constitutional: Positive for activity change. Negative for fever and fatigue.  HENT: Negative.   Respiratory: Negative for cough, chest tightness and shortness of breath.   Cardiovascular: Positive for chest pain. Negative for palpitations and leg swelling.  Gastrointestinal: Negative.   Neurological: Negative for dizziness, tremors, syncope, facial asymmetry, speech difficulty and headaches.    Allergies  Ibuprofen; Rosuvastatin; and Statins  Home Medications   Prior to Admission medications   Medication Sig  Start Date End Date Taking? Authorizing Provider  aspirin EC 81 MG tablet Take 81 mg by mouth 2 (two) times daily.    Historical Provider, MD  clonazePAM (KLONOPIN) 0.5 MG tablet TAKE 1 TABLET BY MOUTH TWICE A DAY AS NEEDED FOR ANXIETY 04/25/12   Janith Lima, MD  losartan-hydrochlorothiazide (HYZAAR) 50-12.5 MG per tablet Take 1 tablet by mouth daily.    Historical Provider, MD  meclizine (ANTIVERT) 25 MG tablet TAKE 1 TABLET BY MOUTH 3 TIMES A DAY AS NEEDED VERTIGO 09/28/11   Janith Lima, MD  metoprolol tartrate (LOPRESSOR) 25 MG tablet 25 mg in morning and 12.5mg  in evening 07/05/12   Davonna Belling, MD  mineral/vitamin supplement (MULTIGEN) 70 MG TABS Take 1 tablet (70 mg total) by mouth daily. 08/10/11   Fay Records, MD  Multiple Vitamins-Minerals (MULTIVITAMIN,TX-MINERALS) tablet Take 1 tablet by mouth daily.      Historical Provider, MD   BP 126/76 mmHg  Pulse 63  Temp(Src) 97.8 F (36.6 C) (Oral)  Resp 16  SpO2 99% Physical Exam  Constitutional: She is oriented to person, place, and time. She appears well-developed and well-nourished. No distress.  HENT:  Head: Normocephalic and atraumatic.  Eyes: EOM are normal. Pupils are equal, round, and reactive to light.  Neck: Normal range of motion. Neck supple.  No cervical tenderness  Cardiovascular: Normal rate, regular rhythm and intact distal pulses.   Pulmonary/Chest: Effort normal and breath sounds normal. No respiratory distress.  She has no wheezes. She has no rales. She exhibits tenderness.  Tenderness level 5 and 6 ribs lateral aspect along the AAL, MAL, beneath breast. No sternal or anterior chest wall tenderness. Resp even and non labored  Abdominal: Soft. There is no tenderness.  Musculoskeletal: She exhibits no edema.  Lymphadenopathy:    She has no cervical adenopathy.  Neurological: She is alert and oriented to person, place, and time. She exhibits normal muscle tone.  Skin: Skin is warm and dry.  Psychiatric: She has  a normal mood and affect.  Nursing note and vitals reviewed.   ED Course  Procedures (including critical care time) Labs Review Labs Reviewed - No data to display  Imaging Review Dg Ribs Unilateral W/chest Left  09/08/2014   CLINICAL DATA:  Pt fell 2 days ago; She complains of left anterior rib pain; Pain marked by "bb"  EXAM: LEFT RIBS AND CHEST - 3+ VIEW  COMPARISON:  Chest CT, 02/02/2013.  FINDINGS: No convincing rib fracture.  No rib lesion.  Cardiac silhouette mildly enlarged. Aorta is uncoiled. No mediastinal or hilar masses. Clear lungs. No pleural effusion or pneumothorax.  IMPRESSION: No rib fracture or rib lesion.  No acute cardiopulmonary disease.   Electronically Signed   By: Lajean Manes M.D.   On: 09/08/2014 11:42     MDM   1. Rib contusion, left, initial encounter   2. Fall, initial encounter    Ice for the next couple days. Continue may try heat Ibuprofen for pain may also use Tylenol For worsening, new symptoms or problems such as cough, fever, shortness of breath sick medical attention promptly. Patient is discharged in stable condition and without respiratory symptoms.    Janne Napoleon, NP 09/08/14 1234

## 2014-09-08 NOTE — ED Notes (Signed)
Pt  Fell  In  [employer]  Yesterday     -  No  Syncope         Pain  l  Ribs              Hurts  When  She  Breaths  Or  Coughs        Sitting      Up   On stretcher  Speaking    In complete  sentances          Awake  And  Alert

## 2014-09-24 ENCOUNTER — Other Ambulatory Visit: Payer: Self-pay | Admitting: Cardiology

## 2014-09-24 DIAGNOSIS — I7102 Dissection of abdominal aorta: Secondary | ICD-10-CM

## 2014-09-24 DIAGNOSIS — I714 Abdominal aortic aneurysm, without rupture, unspecified: Secondary | ICD-10-CM

## 2014-09-28 ENCOUNTER — Other Ambulatory Visit: Payer: Self-pay | Admitting: Cardiology

## 2014-09-28 DIAGNOSIS — I712 Thoracic aortic aneurysm, without rupture, unspecified: Secondary | ICD-10-CM

## 2014-10-01 ENCOUNTER — Ambulatory Visit
Admission: RE | Admit: 2014-10-01 | Discharge: 2014-10-01 | Disposition: A | Discharge status: Home / Self Care | Payer: Medicare Other | Source: Ambulatory Visit | Attending: Cardiology | Admitting: Cardiology

## 2014-10-01 DIAGNOSIS — I712 Thoracic aortic aneurysm, without rupture, unspecified: Secondary | ICD-10-CM

## 2014-10-01 MED ORDER — IOHEXOL 350 MG/ML SOLN
80.0000 mL | Freq: Once | INTRAVENOUS | Status: AC | PRN
  Administered 2014-10-01: 80 mL via INTRAVENOUS

## 2014-12-31 ENCOUNTER — Other Ambulatory Visit: Payer: Self-pay

## 2014-12-31 DIAGNOSIS — Z1231 Encounter for screening mammogram for malignant neoplasm of breast: Secondary | ICD-10-CM

## 2015-01-09 ENCOUNTER — Encounter: Payer: Self-pay | Admitting: Gastroenterology

## 2015-02-04 IMAGING — US US SOFT TISSUE HEAD/NECK
1 series · 14 of 25 positions shown · non-contrast
Comparison: Ultrasound dated 01/24/2013

CLINICAL DATA: Thyromegaly.

EXAM:
THYROID ULTRASOUND
TECHNIQUE: Ultrasound examination of the thyroid gland and adjacent soft
tissues was performed.

[Series 1: us soft tissue head/neck · 0.07mm/px · 14 of 49 slices shown]
[im 1/49]
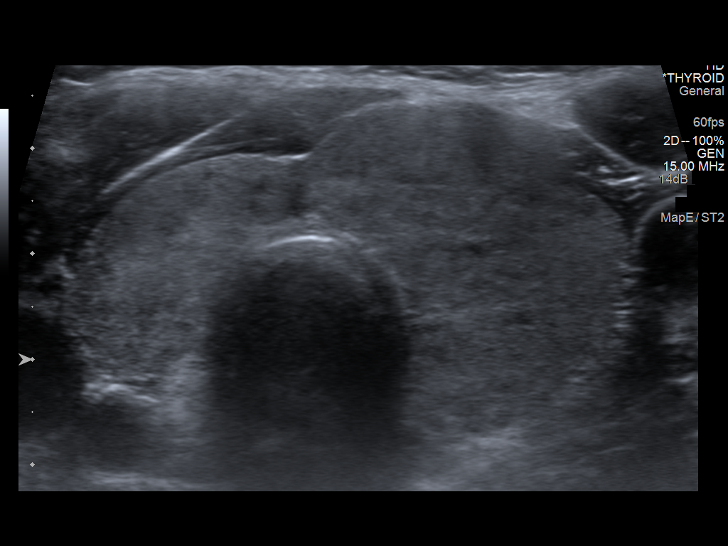
[im 5/49]
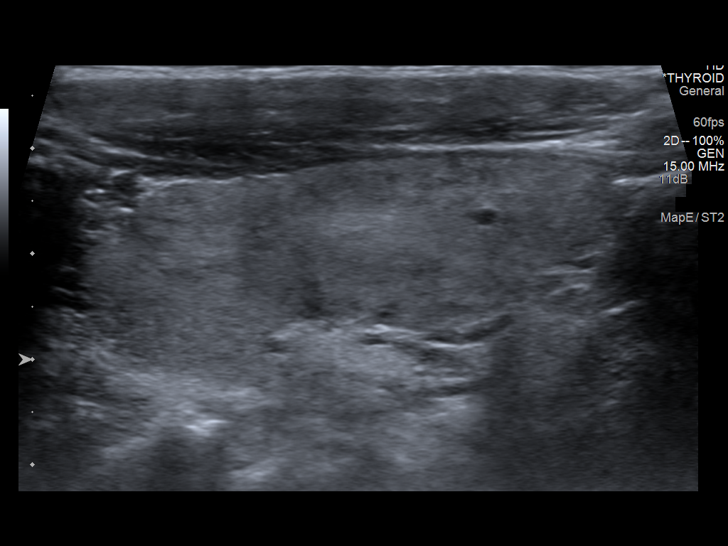
[im 9/49]
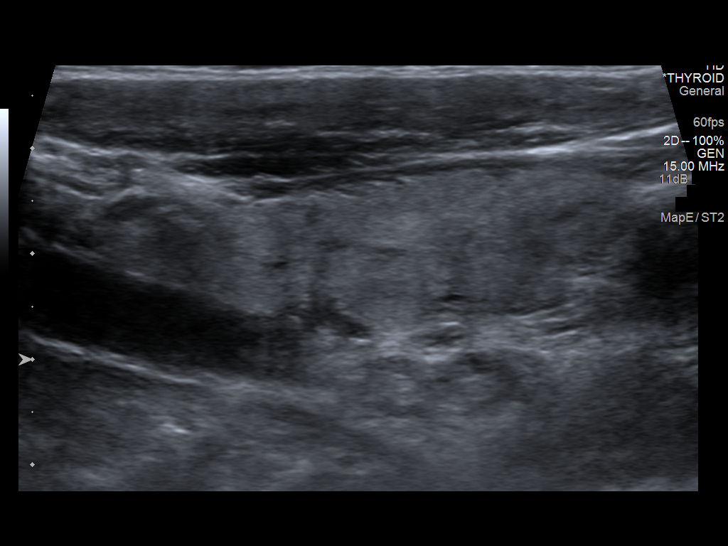
[im 13/49]
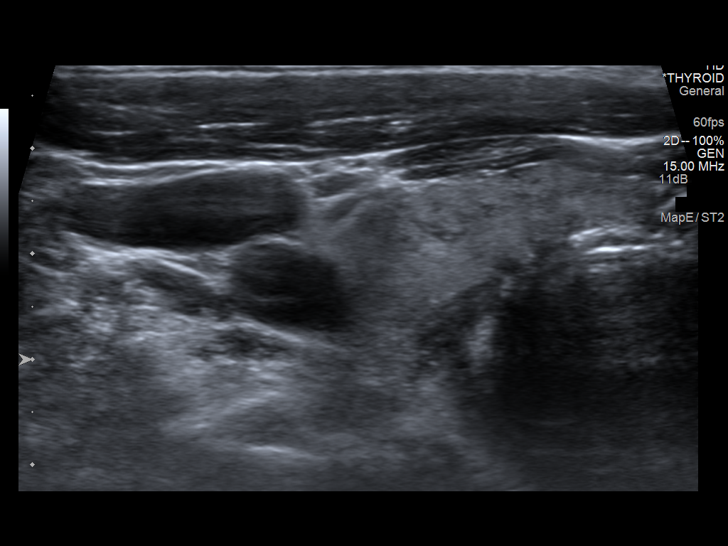
[im 17/49]
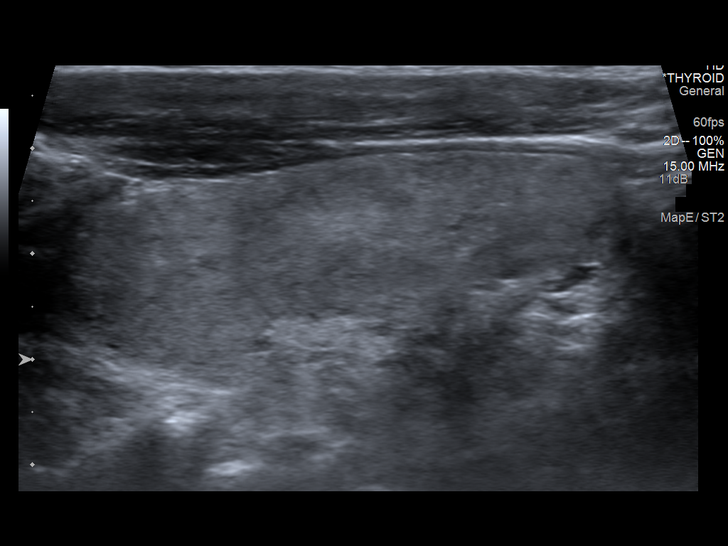
[im 19/49]
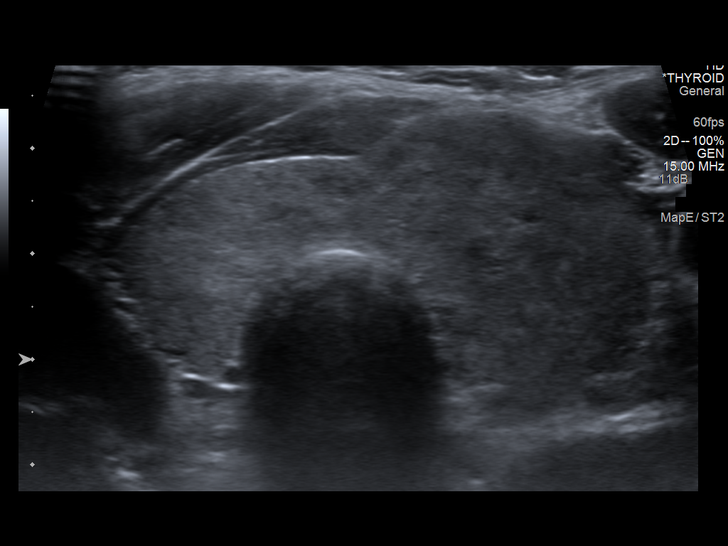
[im 23/49]
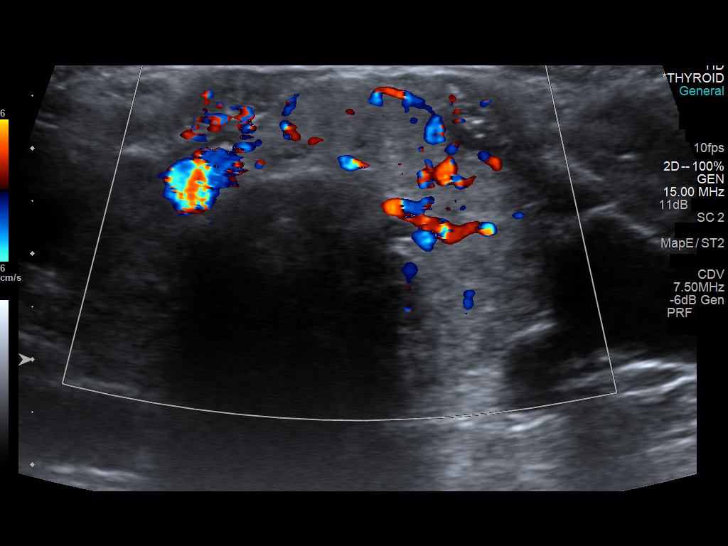
[im 27/49]
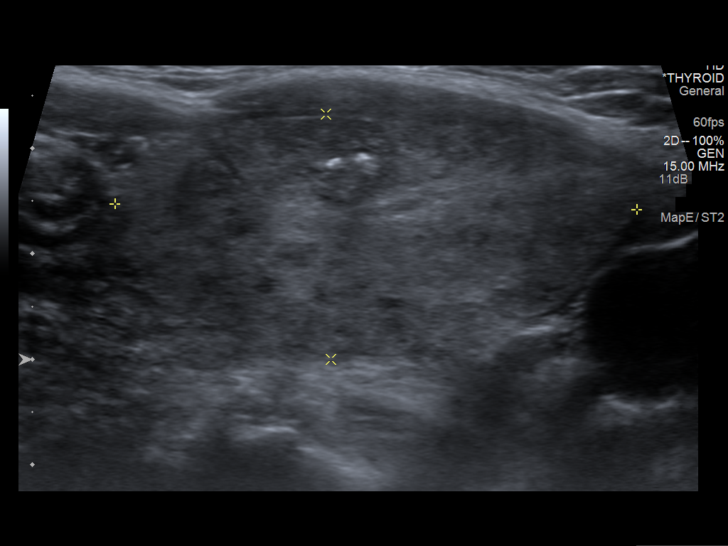
[im 31/49]
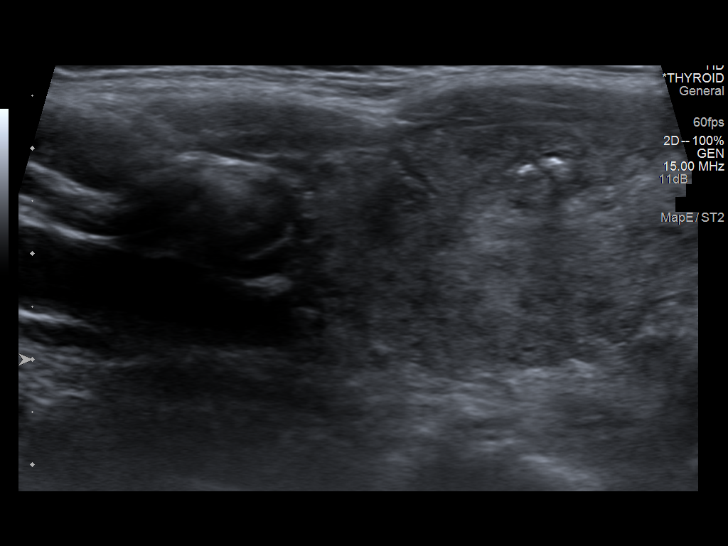
[im 33/49]
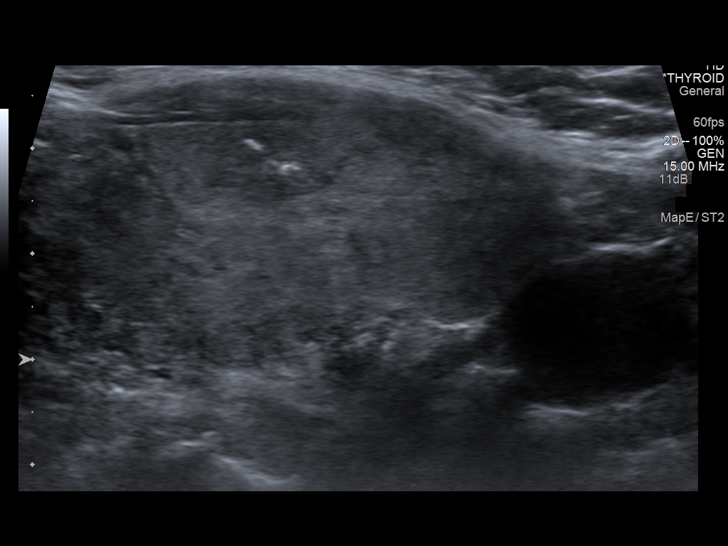
[im 37/49]
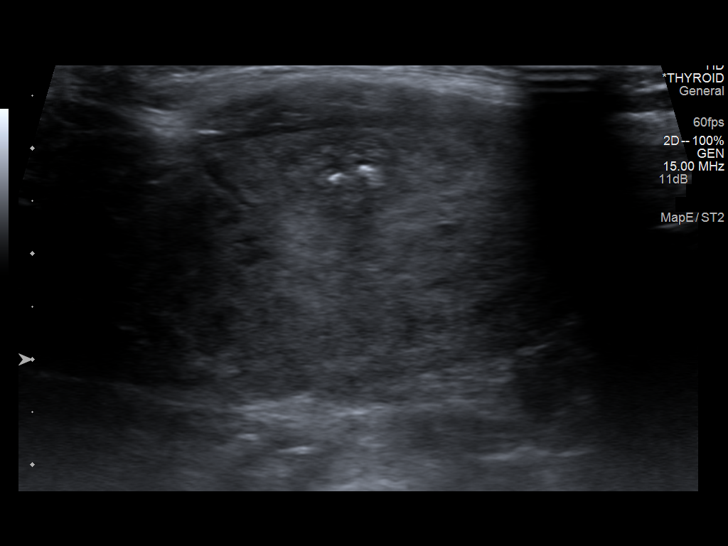
[im 41/49]
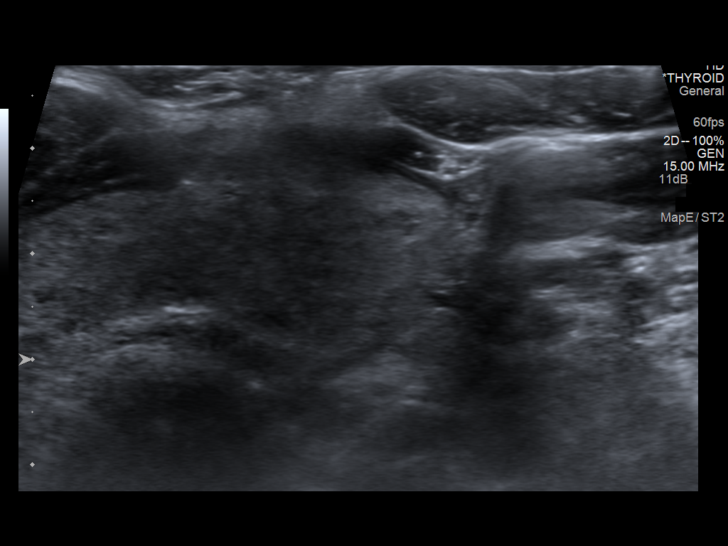
[im 45/49]
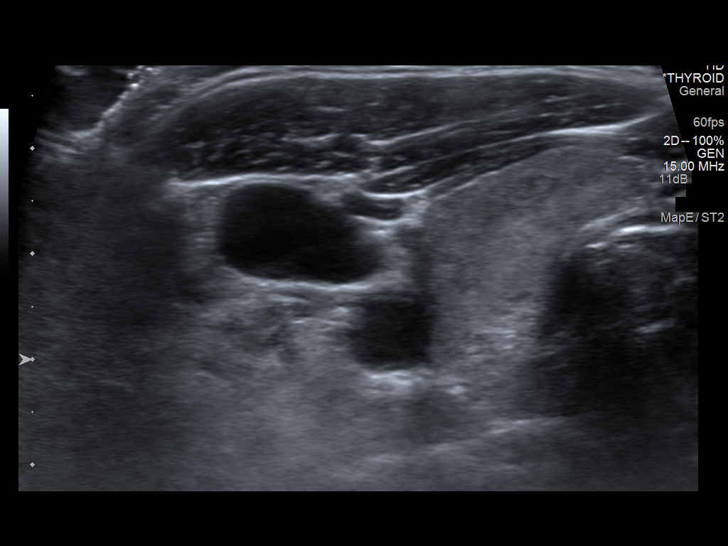
[im 49/49]
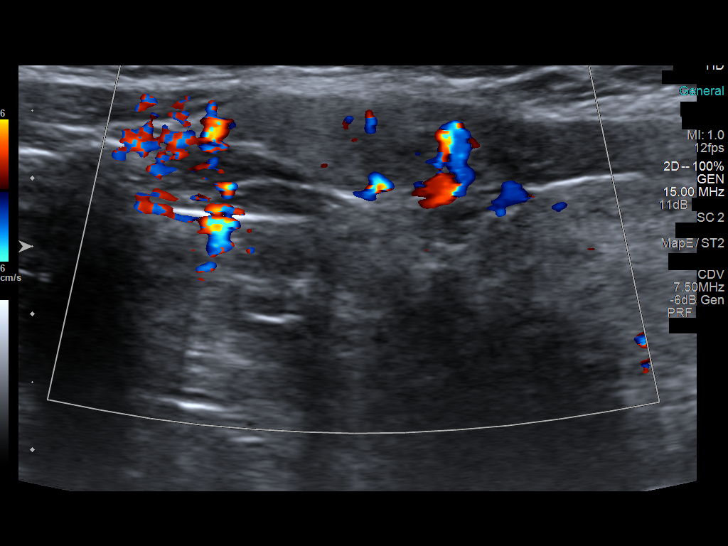

[14 of 25 positions shown; findings below may reference images not displayed]

FINDINGS: The gland is diffusely inhomogeneous and enlarged and slightly
hypervascular.

Right thyroid lobe

Measurements: 5.3 x 1.5 x 1.7 cm.  No nodules visualized.

Left thyroid lobe

Measurements: 4.9 x 2.3 x 2.2 cm. There is a solid partial calcified
1.2 x 0.8 x 1.0 cm nodule in the mid left lobe, unchanged.

Isthmus

Thickness: 0.9 cm. 2.4 x 0.8 x 1.7 cm slightly hypoechoic nodule in
the left side of the isthmus, essentially unchanged.

Lymphadenopathy

None visualized.
IMPRESSION: No significant change in the appearance of the multinodular goiter.

## 2015-02-11 ENCOUNTER — Ambulatory Visit: Payer: Medicare Other

## 2015-03-28 ENCOUNTER — Other Ambulatory Visit: Payer: Self-pay | Admitting: Ophthalmology

## 2015-03-28 NOTE — H&P (Signed)
History & Physical:   DATE:   03-20-15  NAME:  [name], [name]     [id number]       HISTORY OF PRESENT ILLNESS: Chief Eye Complaints   Dry Eye/Mac Degen patient: Patient c/o having a decrease in OD. Patient has not used any gtts this am.   HPI: EYES: Reports symptoms of  TEARING (RIGHT EYE) WHEN WATCHING TV FOR A LONG TIME.     LOCATION: RIGHT EYE        QUALITY/COURSE:   Reports condition is unchanged.        INTENSITY/SEVERITY:    Reports measurement ( or degree) as mild.      DURATION:   Reports the general length of symptoms to be months.      ONSET/TIMING:   Reports occurrence as   CONTEXT/WHEN:   Reports usually associated with   MODIFIERS/TREATMENTS:  Improved by               ACTIVE PROBLEMS: Combined forms of age-related cataract, right eye   ICD10: H25.811  Initial Date:   ICD9:   Hypertensive retinopathy   ICD10: H35.039  Initial Date:   ICD9: 362.11  Macular degeneration   ICD10: H35.30  Initial Date:   ICD9: 362.50 stable - PATIENT STABLE RECENTLY SEEN BY RETINA SPECIALIST NO CRNV EITHER EYE! Dry eye syndrome   ICD10: H04.129  Initial Date:   ICD9: 375.15  SURGERIES: phaco emulsion cataract extraction  w/IOL OS Dr. Ewing Schlein List - Surgeries  MEDICATIONS: Metoprolol  (Lopressor):   50 mg tablet  SIG-  1 each   2 times a day   Azor: Strength-  SIG-    Aspirin:  81 mg tablet  SIG-  1 each   once a day   Ciloxan (Ciprofloxacin) Solution: 0.3% solution SIG-  1 drop in right eye BID   Ilevro: 0.3% suspension SIG-  1 gtt in right eye once a day   artificial tears  REVIEW OF SYSTEMS: ROS:   GEN- Constitutional: HENT: GEN - Endocrine: Reports symptoms of LUNGS/Respiratory:  HEART/Cardiovascular: Reports symptoms of   hypertension ABD/Gastrointestinal:  Musculoskeletal (BJE): NEURO/Neurological: PSYCH/Psychiatric:    Is the pt oriented to time, place, person?  Mood  normal    TOBACCO: Never smoker   ICD10: Z87.898 ICD9: V13.89     SOCIAL HISTORY: retired  FAMILY HISTORY: Positive family history for  -  HTN   Family History - 1st Degree Relatives:  Mother dead.  Father is dead.  ALLERGIES: Drug Allergies.  No Known.   Starter - Allergies - Summary:  PHYSICAL EXAMINATION: VS: BMI: 27.0.  BP: 119/68.  H: 65.00 in.  P: 60 /min.  RR: 20 /min.  W: 162lbs 0oz.    Va     OD:[place] 20/100  ph 20/70   CC 20/60+  PH 20/40 OS:[place] 20/40-2  ph NI       CC  20/30-2  PH 20/25-2  EYEGLASSES:  OD: +1.00 +1.25 x091 OS: -1.50 + 0.75 x063 ADD:+2.75  MR 09/19/2014 12:18   OD:  +1.50 +1.00 x093 20/40 OS:  -1.50 +0.50 x064 20/30 ADD: +2.75  K's OD: 47.00    axis 178       47.50    axis 088 OS: 47.00    axis 020        48.00   axis 110  VF:   EP:PIRJ  to confrontation testing      OS  Full to confrontation testing     Motility: full versions orthophoria  PUPILS: 3 mm normal reaction OU negative Marcus Gunn  EYELIDS & OCULAR ADNEXAright lower lid cyst, normal OS  SLE: Conjunctiva quiet each eye  Cornea decreased tear film trace staining left  eye with endothelial pigment OS and arcus each eye GUTTAE/ PIGMENT    anterior chamber  deep and quiet each eye  Iris brown each eye  Lens  +3 nuclear sclerosis & +3 cortical opacties ANTERIOR VACOULE OD,  posterior chamber intraocular lens implant in good position OS  Vitreous  CCT  Ta   in mmHg    OD 10       OS 12 Time  Dilation   Fundus:  optic nerve  OD  slight discoloration 30% cup                                                                   OS slight discoloration 30% cup   Macula      OD CLEAR     OS CLEAR , RPE DROP OUT   Vessels silver wire change OU, NARROW ARTERIOLES   Periphery normal     Exam: GENERAL: Appearance: HEAD, EARS, NOSE AND THROAT: Ears-Nose (external) Inspection: Externally, nose and ears are normal in appearance and without scars, lesions, or nodules.      Hearing assessment shows no problems with normal conversation.       LUNGS and RESPIRATORY: Lung auscultation elicits no wheezing, rhonci, rales or rubs and with equal breath sounds.    Respiratory effort described as breathing is unlabored and chest movement is symmetrical.    HEART (Cardiovascular): Heart auscultation discovers regular rate and rhythm; no murmur, gallop or rub. Normal heart sounds.    ABDOMEN (Gastrointestinal): Mass/Tenderness Exam: Neither are present.     MUSCULOSKELETAL (BJE): Inspection-Palpation: No major bone, joint, tendon, or muscle changes.      NEUROLOGICAL: Alert and oriented. No major deficits of coordination or sensation.      PSYCHIATRIC: Insight and judgment appear  both to be intact and appropriate.    Mood and affect are described as normal mood and full affect.    SKIN: Skin Inspection: No rashes or lesions  ADMITTING DIAGNOSIS: Combined forms of age-related cataract, right eye   ICD10: H25.811   Hypertensive retinopathy   ICD10: H35.039   Macular degeneration   ICD10: H35.30  stable - PATIENT STABLE RECENTLY SEEN BY RETINA SPECIALIST NO CRNV EITHER EYE Dry eye syndrome   ICD10: H04.129  SURGICAL TREATMENT PLAN:     phaco emulsion cataract extraction  OD   intraocular lens implant     Risk and benefits of surgery have been reviewed with the patient and the patient agrees to proceed with the surgical procedure.    dilation today  Actions:     Handouts: Beta-Blockers.    ___________________________ Marylynn Pearson, Brooke Bonito Starter - Inactive Problems:

## 2015-04-02 ENCOUNTER — Encounter (HOSPITAL_COMMUNITY): Payer: Self-pay | Admitting: *Deleted

## 2015-04-02 MED ORDER — CYCLOPENTOLATE HCL 1 % OP SOLN
1.0000 [drp] | OPHTHALMIC | Status: DC
  Filled 2015-04-02: qty 2

## 2015-04-02 MED ORDER — TROPICAMIDE 1 % OP SOLN
1.0000 [drp] | OPHTHALMIC | Status: DC
  Filled 2015-04-02: qty 3

## 2015-04-02 MED ORDER — PHENYLEPHRINE HCL 2.5 % OP SOLN
1.0000 [drp] | OPHTHALMIC | Status: DC
Start: 2015-04-03 — End: 2015-04-03
  Filled 2015-04-02: qty 2

## 2015-04-02 MED ORDER — GATIFLOXACIN 0.5 % OP SOLN
1.0000 [drp] | OPHTHALMIC | Status: DC
Start: 2015-04-03 — End: 2015-04-03
  Filled 2015-04-02: qty 2.5

## 2015-04-02 NOTE — Anesthesia Preprocedure Evaluation (Addendum)
Anesthesia Evaluation  Patient identified by MRN, date of birth, ID band Patient awake    Reviewed: Allergy & Precautions, Patient's Chart, lab work & pertinent test results  History of Anesthesia Complications Negative for: history of anesthetic complications  Airway Mallampati: II  TM Distance: >3 FB Neck ROM: Full    Dental  (+) Poor Dentition, Dental Advisory Given   Pulmonary neg pulmonary ROS,    Pulmonary exam normal        Cardiovascular hypertension, Pt. on medications + Peripheral Vascular Disease  Normal cardiovascular exam     Neuro/Psych PSYCHIATRIC DISORDERS Anxiety negative neurological ROS     GI/Hepatic Neg liver ROS, GERD  ,  Endo/Other  negative endocrine ROS  Renal/GU negative Renal ROS     Musculoskeletal   Abdominal   Peds  Hematology   Anesthesia Other Findings   Reproductive/Obstetrics                          Anesthesia Physical Anesthesia Plan  ASA: III  Anesthesia Plan: MAC   Post-op Pain Management:    Induction: Intravenous  Airway Management Planned: Simple Face Mask  Additional Equipment:   Intra-op Plan:   Post-operative Plan:   Informed Consent: I have reviewed the patients History and Physical, chart, labs and discussed the procedure including the risks, benefits and alternatives for the proposed anesthesia with the patient or authorized representative who has indicated his/her understanding and acceptance.   Dental advisory given  Plan Discussed with: CRNA, Anesthesiologist and Surgeon  Anesthesia Plan Comments:        Anesthesia Quick Evaluation

## 2015-04-02 NOTE — Progress Notes (Signed)
Pre-op instructions given only; please complete assessment on DOS; pt made aware to stop otc vitamins and herbal medications.

## 2015-04-03 ENCOUNTER — Encounter (HOSPITAL_COMMUNITY): Payer: Self-pay | Admitting: *Deleted

## 2015-04-03 ENCOUNTER — Encounter (HOSPITAL_COMMUNITY)
Admission: RE | Disposition: A | Discharge status: Home / Self Care | Payer: Self-pay | Source: Ambulatory Visit | Attending: Ophthalmology

## 2015-04-03 ENCOUNTER — Ambulatory Visit (HOSPITAL_COMMUNITY)
Admission: RE | Admit: 2015-04-03 | Discharge: 2015-04-03 | Disposition: A | Discharge status: Home / Self Care | Payer: Medicare Other | Source: Ambulatory Visit | Attending: Ophthalmology | Admitting: Ophthalmology

## 2015-04-03 ENCOUNTER — Ambulatory Visit (HOSPITAL_COMMUNITY): Payer: Medicare Other | Admitting: Anesthesiology

## 2015-04-03 DIAGNOSIS — Z79899 Other long term (current) drug therapy: Secondary | ICD-10-CM | POA: Insufficient documentation

## 2015-04-03 DIAGNOSIS — F419 Anxiety disorder, unspecified: Secondary | ICD-10-CM | POA: Insufficient documentation

## 2015-04-03 DIAGNOSIS — I739 Peripheral vascular disease, unspecified: Secondary | ICD-10-CM | POA: Diagnosis not present

## 2015-04-03 DIAGNOSIS — H25811 Combined forms of age-related cataract, right eye: Secondary | ICD-10-CM | POA: Diagnosis not present

## 2015-04-03 DIAGNOSIS — Z7982 Long term (current) use of aspirin: Secondary | ICD-10-CM | POA: Insufficient documentation

## 2015-04-03 DIAGNOSIS — K219 Gastro-esophageal reflux disease without esophagitis: Secondary | ICD-10-CM | POA: Diagnosis not present

## 2015-04-03 DIAGNOSIS — H3530 Unspecified macular degeneration: Secondary | ICD-10-CM | POA: Insufficient documentation

## 2015-04-03 DIAGNOSIS — H269 Unspecified cataract: Secondary | ICD-10-CM | POA: Diagnosis present

## 2015-04-03 DIAGNOSIS — H35039 Hypertensive retinopathy, unspecified eye: Secondary | ICD-10-CM | POA: Insufficient documentation

## 2015-04-03 DIAGNOSIS — H04129 Dry eye syndrome of unspecified lacrimal gland: Secondary | ICD-10-CM | POA: Diagnosis not present

## 2015-04-03 HISTORY — PX: CATARACT EXTRACTION W/PHACO: SHX586

## 2015-04-03 HISTORY — DX: Dizziness and giddiness: R42

## 2015-04-03 LAB — BASIC METABOLIC PANEL
Anion gap: 6 (ref 5–15)
BUN: 17 mg/dL (ref 6–20)
CO2: 26 mmol/L (ref 22–32)
Calcium: 8.8 mg/dL — ABNORMAL LOW (ref 8.9–10.3)
Chloride: 104 mmol/L (ref 101–111)
Creatinine, Ser: 0.8 mg/dL (ref 0.44–1.00)
GFR calc Af Amer: >60 mL/min (ref >60)
GFR calc non Af Amer: >60 mL/min (ref >60)
Glucose, Bld: 93 mg/dL (ref 65–99)
Potassium: 4.1 mmol/L (ref 3.5–5.1)
Sodium: 136 mmol/L (ref 135–145)

## 2015-04-03 LAB — CBC
HCT: 34.4 % — ABNORMAL LOW (ref 36.0–46.0)
Hemoglobin: 12.1 g/dL (ref 12.0–15.0)
MCH: 33.2 pg (ref 26.0–34.0)
MCHC: 35.2 g/dL (ref 30.0–36.0)
MCV: 94.5 fL (ref 78.0–100.0)
Platelets: 165 10*3/uL (ref 150–400)
RBC: 3.64 MIL/uL — ABNORMAL LOW (ref 3.87–5.11)
RDW: 13.3 % (ref 11.5–15.5)
WBC: 5.1 10*3/uL (ref 4.0–10.5)

## 2015-04-03 LAB — GLUCOSE, CAPILLARY
Glucose-Capillary: 87 mg/dL (ref 65–99)
Glucose-Capillary: 84 mg/dL (ref 65–99)

## 2015-04-03 SURGERY — PHACOEMULSIFICATION, CATARACT, WITH IOL INSERTION
Anesthesia: Monitor Anesthesia Care | Laterality: Right

## 2015-04-03 MED ORDER — EPINEPHRINE HCL 1 MG/ML IJ SOLN
INTRAMUSCULAR | Status: AC
  Filled 2015-04-03: qty 1

## 2015-04-03 MED ORDER — DEXAMETHASONE SODIUM PHOSPHATE 10 MG/ML IJ SOLN
INTRAMUSCULAR | Status: AC
  Filled 2015-04-03: qty 1

## 2015-04-03 MED ORDER — HYALURONIDASE HUMAN 150 UNIT/ML IJ SOLN
INTRAMUSCULAR | Status: AC
  Filled 2015-04-03: qty 1

## 2015-04-03 MED ORDER — LIDOCAINE HCL (CARDIAC) 20 MG/ML IV SOLN
INTRAVENOUS | Status: DC | PRN
  Administered 2015-04-03 (×2): 40 mg via INTRAVENOUS

## 2015-04-03 MED ORDER — TETRACAINE HCL 0.5 % OP SOLN
1.0000 [drp] | OPHTHALMIC | Status: DC
  Filled 2015-04-03 (×2): qty 2

## 2015-04-03 MED ORDER — BUPIVACAINE HCL (PF) 0.75 % IJ SOLN
INTRAMUSCULAR | Status: AC
  Filled 2015-04-03: qty 10

## 2015-04-03 MED ORDER — BSS IO SOLN
INTRAOCULAR | Status: AC
  Filled 2015-04-03: qty 500

## 2015-04-03 MED ORDER — LIDOCAINE HCL 2 % IJ SOLN
INTRAMUSCULAR | Status: AC
  Filled 2015-04-03: qty 20

## 2015-04-03 MED ORDER — GATIFLOXACIN 0.5 % OP SOLN
1.0000 [drp] | OPHTHALMIC | Status: AC
  Administered 2015-04-03 (×3): 1 [drp] via OPHTHALMIC

## 2015-04-03 MED ORDER — NA CHONDROIT SULF-NA HYALURON 40-30 MG/ML IO SOLN
INTRAOCULAR | Status: AC
  Filled 2015-04-03: qty 0.5

## 2015-04-03 MED ORDER — CYCLOPENTOLATE HCL 1 % OP SOLN
1.0000 [drp] | OPHTHALMIC | Status: AC
  Administered 2015-04-03 (×3): 1 [drp] via OPHTHALMIC

## 2015-04-03 MED ORDER — MIDAZOLAM HCL 5 MG/5ML IJ SOLN
INTRAMUSCULAR | Status: DC | PRN
  Administered 2015-04-03 (×2): 1 mg via INTRAVENOUS

## 2015-04-03 MED ORDER — BSS IO SOLN
INTRAOCULAR | Status: AC
  Filled 2015-04-03: qty 15

## 2015-04-03 MED ORDER — FENTANYL CITRATE (PF) 100 MCG/2ML IJ SOLN
INTRAMUSCULAR | Status: DC | PRN
  Administered 2015-04-03: 25 ug via INTRAVENOUS

## 2015-04-03 MED ORDER — PROPOFOL 10 MG/ML IV BOLUS
INTRAVENOUS | Status: DC | PRN
  Administered 2015-04-03: 30 mg via INTRAVENOUS
  Administered 2015-04-03: 10 mg via INTRAVENOUS

## 2015-04-03 MED ORDER — SODIUM HYALURONATE 10 MG/ML IO SOLN
INTRAOCULAR | Status: DC | PRN
  Administered 2015-04-03: 0.85 mL via INTRAOCULAR

## 2015-04-03 MED ORDER — SODIUM CHLORIDE 0.9 % IJ SOLN
INTRAMUSCULAR | Status: AC
  Filled 2015-04-03: qty 10

## 2015-04-03 MED ORDER — TROPICAMIDE 1 % OP SOLN
1.0000 [drp] | OPHTHALMIC | Status: AC
  Administered 2015-04-03 (×3): 1 [drp] via OPHTHALMIC

## 2015-04-03 MED ORDER — STERILE WATER FOR IRRIGATION IR SOLN
Status: DC | PRN
  Administered 2015-04-03: 100 mL

## 2015-04-03 MED ORDER — NA CHONDROIT SULF-NA HYALURON 40-30 MG/ML IO SOLN
INTRAOCULAR | Status: DC | PRN
  Administered 2015-04-03: 0.5 mL via INTRAOCULAR

## 2015-04-03 MED ORDER — PHENYLEPHRINE 40 MCG/ML (10ML) SYRINGE FOR IV PUSH (FOR BLOOD PRESSURE SUPPORT)
PREFILLED_SYRINGE | INTRAVENOUS | Status: AC
  Filled 2015-04-03: qty 10

## 2015-04-03 MED ORDER — GLYCOPYRROLATE 0.2 MG/ML IJ SOLN
INTRAMUSCULAR | Status: AC
  Filled 2015-04-03: qty 1

## 2015-04-03 MED ORDER — PILOCARPINE HCL 4 % OP SOLN
OPHTHALMIC | Status: AC
  Filled 2015-04-03: qty 15

## 2015-04-03 MED ORDER — BSS IO SOLN
INTRAOCULAR | Status: DC | PRN
  Administered 2015-04-03: 15 mL via INTRAOCULAR

## 2015-04-03 MED ORDER — ONDANSETRON HCL 4 MG/2ML IJ SOLN
INTRAMUSCULAR | Status: AC
  Filled 2015-04-03: qty 2

## 2015-04-03 MED ORDER — MIDAZOLAM HCL 2 MG/2ML IJ SOLN
INTRAMUSCULAR | Status: AC
  Filled 2015-04-03: qty 4

## 2015-04-03 MED ORDER — GENTAMICIN SULFATE 40 MG/ML IJ SOLN
INTRAMUSCULAR | Status: AC
  Filled 2015-04-03: qty 2

## 2015-04-03 MED ORDER — ACETYLCHOLINE CHLORIDE 20 MG IO SOLR
INTRAOCULAR | Status: AC
  Filled 2015-04-03: qty 1

## 2015-04-03 MED ORDER — PHENYLEPHRINE HCL 2.5 % OP SOLN
1.0000 [drp] | OPHTHALMIC | Status: AC
  Administered 2015-04-03 (×3): 1 [drp] via OPHTHALMIC

## 2015-04-03 MED ORDER — TETRACAINE HCL 0.5 % OP SOLN
OPHTHALMIC | Status: AC
  Filled 2015-04-03: qty 2

## 2015-04-03 MED ORDER — LIDOCAINE-EPINEPHRINE 2 %-1:100000 IJ SOLN
INTRAMUSCULAR | Status: DC | PRN
  Administered 2015-04-03: 4 mL via RETROBULBAR

## 2015-04-03 MED ORDER — BSS IO SOLN
INTRAOCULAR | Status: DC | PRN
  Administered 2015-04-03: 09:00:00

## 2015-04-03 MED ORDER — 0.9 % SODIUM CHLORIDE (POUR BTL) OPTIME
TOPICAL | Status: DC | PRN
  Administered 2015-04-03: 200 mL

## 2015-04-03 MED ORDER — LACTATED RINGERS IV SOLN
INTRAVENOUS | Status: DC | PRN
  Administered 2015-04-03: 08:00:00 via INTRAVENOUS

## 2015-04-03 MED ORDER — LIDOCAINE HCL (CARDIAC) 20 MG/ML IV SOLN
INTRAVENOUS | Status: AC
  Filled 2015-04-03: qty 5

## 2015-04-03 MED ORDER — ROCURONIUM BROMIDE 50 MG/5ML IV SOLN
INTRAVENOUS | Status: AC
  Filled 2015-04-03: qty 1

## 2015-04-03 MED ORDER — SUCCINYLCHOLINE CHLORIDE 20 MG/ML IJ SOLN
INTRAMUSCULAR | Status: AC
  Filled 2015-04-03: qty 1

## 2015-04-03 MED ORDER — TOBRAMYCIN-DEXAMETHASONE 0.3-0.1 % OP OINT
TOPICAL_OINTMENT | OPHTHALMIC | Status: AC
  Filled 2015-04-03: qty 3.5

## 2015-04-03 MED ORDER — ACETYLCHOLINE CHLORIDE 20 MG IO SOLR
INTRAOCULAR | Status: DC | PRN
  Administered 2015-04-03: 20 mg via INTRAOCULAR

## 2015-04-03 MED ORDER — PROPOFOL 10 MG/ML IV BOLUS
INTRAVENOUS | Status: AC
  Filled 2015-04-03: qty 20

## 2015-04-03 MED ORDER — TOBRAMYCIN 0.3 % OP OINT
TOPICAL_OINTMENT | OPHTHALMIC | Status: DC | PRN
  Administered 2015-04-03: 1 via OPHTHALMIC

## 2015-04-03 MED ORDER — LIDOCAINE-EPINEPHRINE 2 %-1:100000 IJ SOLN
INTRAMUSCULAR | Status: AC
  Filled 2015-04-03: qty 1

## 2015-04-03 MED ORDER — SODIUM HYALURONATE 10 MG/ML IO SOLN
INTRAOCULAR | Status: AC
  Filled 2015-04-03: qty 0.85

## 2015-04-03 MED ORDER — FENTANYL CITRATE (PF) 250 MCG/5ML IJ SOLN
INTRAMUSCULAR | Status: AC
  Filled 2015-04-03: qty 5

## 2015-04-03 MED ORDER — ONDANSETRON HCL 4 MG/2ML IJ SOLN
INTRAMUSCULAR | Status: DC | PRN
  Administered 2015-04-03: 4 mg via INTRAVENOUS

## 2015-04-03 MED ORDER — EPHEDRINE SULFATE 50 MG/ML IJ SOLN
INTRAMUSCULAR | Status: AC
  Filled 2015-04-03: qty 1

## 2015-04-03 SURGICAL SUPPLY — 34 items
APL SRG 3 HI ABS STRL LF PLS (MISCELLANEOUS) ×1
APPLICATOR COTTON TIP 6IN STRL (MISCELLANEOUS) ×2 IMPLANT
APPLICATOR DR MATTHEWS STRL (MISCELLANEOUS) ×2 IMPLANT
BLADE KERATOME 2.75 (BLADE) ×2 IMPLANT
CANNULA ANTERIOR CHAMBER 27GA (MISCELLANEOUS) ×2 IMPLANT
CORDS BIPOLAR (ELECTRODE) IMPLANT
COVER MAYO STAND STRL (DRAPES) ×2 IMPLANT
DRAPE OPHTHALMIC 40X48 W POUCH (DRAPES) ×2 IMPLANT
DRAPE RETRACTOR (MISCELLANEOUS) ×2 IMPLANT
GLOVE BIO SURGEON STRL SZ8 (GLOVE) ×2 IMPLANT
GLOVE BIOGEL PI IND STRL 7.5 (GLOVE) ×1 IMPLANT
GLOVE BIOGEL PI INDICATOR 7.5 (GLOVE) ×1
GLOVE SURG SS PI 7.0 STRL IVOR (GLOVE) ×2 IMPLANT
GOWN STRL REUS W/ TWL LRG LVL3 (GOWN DISPOSABLE) ×2 IMPLANT
GOWN STRL REUS W/TWL LRG LVL3 (GOWN DISPOSABLE) ×4
KIT BASIN OR (CUSTOM PROCEDURE TRAY) ×2 IMPLANT
KIT ROOM TURNOVER OR (KITS) ×2 IMPLANT
LENS IOL ACRSF IQ PC 23.5 (Intraocular Lens) ×1 IMPLANT
LENS IOL ACRYSOF IQ POST 23.5 (Intraocular Lens) ×2 IMPLANT
NEEDLE 18GX1X1/2 (RX/OR ONLY) (NEEDLE) IMPLANT
NEEDLE 25GX 5/8IN NON SAFETY (NEEDLE) ×2 IMPLANT
NEEDLE FILTER BLUNT 18X 1/2SAF (NEEDLE) ×1
NEEDLE FILTER BLUNT 18X1 1/2 (NEEDLE) ×1 IMPLANT
NS IRRIG 1000ML POUR BTL (IV SOLUTION) ×2 IMPLANT
PACK CATARACT CUSTOM (CUSTOM PROCEDURE TRAY) ×2 IMPLANT
PAD ARMBOARD 7.5X6 YLW CONV (MISCELLANEOUS) ×4 IMPLANT
PAK PIK CVS CATARACT (OPHTHALMIC) ×2 IMPLANT
SUT ETHILON 10 0 CS140 6 (SUTURE) IMPLANT
SUT SILK 6 0 G 6 (SUTURE) IMPLANT
SYR TB 1ML LUER SLIP (SYRINGE) ×2 IMPLANT
TIP ABS 45DEG FLARED 0.9MM (TIP) ×2 IMPLANT
TOWEL OR 17X26 10 PK STRL BLUE (TOWEL DISPOSABLE) ×2 IMPLANT
WATER STERILE IRR 1000ML POUR (IV SOLUTION) ×2 IMPLANT
WIPE INSTRUMENT VISIWIPE 73X73 (MISCELLANEOUS) ×2 IMPLANT

## 2015-04-03 NOTE — Anesthesia Procedure Notes (Signed)
Procedure Name: MAC Date/Time: 04/03/2015 8:45 AM Performed by: Jenne Campus Pre-anesthesia Checklist: Patient identified, Emergency Drugs available, Suction available, Patient being monitored and Timeout performed Patient Re-evaluated:Patient Re-evaluated prior to inductionOxygen Delivery Method: Nasal cannula

## 2015-04-03 NOTE — Op Note (Signed)
Preoperative diagnosis: Visually significant cataract right eye Postop diagnosis: Same Procedure: Phacoemulsification with intraocular lens implant Combinations: None Anesthesia: 2% Xylocaine with epinephrine in a 50-50 mixture of 0.75% Marcaine with ample Wydase. Procedure: The patient was transported to the operating room where she was given a peribulbar block with the aforementioned local anesthetic agent. Following this the patient's face prepped and draped in the usual sterile fashion. With the surgeon sitting temporally the operating microscope in position a Weck-Cel sponges used to fixate the globe and a 15 blade was used to enter through superior clear cornea using an additional Weck-Cel sponge a 2.75 mm keratome blade was used in a stepwise fashion to enter through clear cornea Viscoat was injected to inflate the anterior chamber a bent 25-gauge needle was used to incise anterior capsule and a continuous tear curvilinear capsulorrhexis was formed the capsule forceps were used to remove the anterior capsule. BSS was used to hydrodissect the hydrodelineate the nucleus and the nucleus was noted to loosened within the capsular bag. The phacoemulsification unit was then introduced into the eye and the unit did not work. Therefore the handpiece was removed from the eye and the assistant had to tighten the handpiece reapply the handpiece after manipulating for 3 minutes the handpiece. Function properly. The fake a muscle case unit was then again introduced into the eye fake emulsification took place remove the epinucleus sculpting a central trough the nucleus was divided using the phacotip and a snack snapper hook into 4 quadrants all nuclear fragments were removed from the eye the posterior capsule remained intact. Aspiration aspiration device was then used to strip cortical fibers from the posterior capsule and removed the posterior epinucleus posterior capsule was polished the capsule remained intact.  Following this Provisc was injected in the anterior chamber the intraocular lens implant was examined and noted to have no defects the lens was an Alcon AcrySof SN 60 WF IQ lens. 23.5 dpt SN number [id number] the lens is placed in lens injector and injected in position with a Kuglen hook following this the I/A was used to remove viscoelastic from the eye the eye was pressurized Miochol was injected in the eye with the pressurized eye there was no leakage therefore all instruments were removed from the eye topical Tobrex ointment was applied to the eye a patch and Fox U were placed and the patient returned to recovery area in stable condition. Marylynn Pearson Junior M.D.

## 2015-04-03 NOTE — Anesthesia Postprocedure Evaluation (Signed)
Anesthesia Post Note  Patient: [name]  Procedure(s) Performed: Procedure(s) (LRB): CATARACT EXTRACTION PHACO AND INTRAOCULAR LENS PLACEMENT ([facility]) (Right)  Anesthesia type: MAC  Patient location: PACU  Post pain: Pain level controlled  Post assessment: Patient's Cardiovascular Status Stable  Last Vitals:  Filed Vitals:   04/03/15 1007  BP:   Pulse: 69  Temp:   Resp: 20    Post vital signs: Reviewed and stable  Level of consciousness: sedated  Complications: No apparent anesthesia complications

## 2015-04-03 NOTE — Discharge Instructions (Signed)
The patient may remove the eye patch today at 1:00 and instilled the eyedrops that were given to her at the office. She should avoid rubbing the eye while asleep the patient should use the plastic eye she'll over her eye sleep on her back or left side the patient should avoid heavy lifting bending or straining.

## 2015-04-03 NOTE — Interval H&P Note (Signed)
History and Physical Interval Note:  04/03/2015 8:33 AM  [name]  has presented today for surgery, with the diagnosis of catract  The various methods of treatment have been discussed with the patient and family. After consideration of risks, benefits and other options for treatment, the patient has consented to  Procedure(s): CATARACT EXTRACTION PHACO AND INTRAOCULAR LENS PLACEMENT ([place]) (Right) as a surgical intervention .  The patient's history has been reviewed, patient examined, no change in status, stable for surgery.  I have reviewed the patient's chart and labs.  Questions were answered to the patient's satisfaction.     Gina Leblond

## 2015-04-03 NOTE — H&P (View-Only) (Signed)
History & Physical:   DATE:   03-20-15  NAME:  [name], [name]     [id number]       HISTORY OF PRESENT ILLNESS: Chief Eye Complaints   Dry Eye/Mac Degen patient: Patient c/o having a decrease in OD. Patient has not used any gtts this am.   HPI: EYES: Reports symptoms of  TEARING (RIGHT EYE) WHEN WATCHING TV FOR A LONG TIME.     LOCATION: RIGHT EYE        QUALITY/COURSE:   Reports condition is unchanged.        INTENSITY/SEVERITY:    Reports measurement ( or degree) as mild.      DURATION:   Reports the general length of symptoms to be months.      ONSET/TIMING:   Reports occurrence as   CONTEXT/WHEN:   Reports usually associated with   MODIFIERS/TREATMENTS:  Improved by               ACTIVE PROBLEMS: Combined forms of age-related cataract, right eye   ICD10: H25.811  Initial Date:   ICD9:   Hypertensive retinopathy   ICD10: H35.039  Initial Date:   ICD9: 362.11  Macular degeneration   ICD10: H35.30  Initial Date:   ICD9: 362.50 stable - PATIENT STABLE RECENTLY SEEN BY RETINA SPECIALIST NO CRNV EITHER EYE! Dry eye syndrome   ICD10: H04.129  Initial Date:   ICD9: 375.15  SURGERIES: phaco emulsion cataract extraction  w/IOL OS Dr. Ewing Schlein List - Surgeries  MEDICATIONS: Metoprolol  (Lopressor):   50 mg tablet  SIG-  1 each   2 times a day   Azor: Strength-  SIG-    Aspirin:  81 mg tablet  SIG-  1 each   once a day   Ciloxan (Ciprofloxacin) Solution: 0.3% solution SIG-  1 drop in right eye BID   Ilevro: 0.3% suspension SIG-  1 gtt in right eye once a day   artificial tears  REVIEW OF SYSTEMS: ROS:   GEN- Constitutional: HENT: GEN - Endocrine: Reports symptoms of LUNGS/Respiratory:  HEART/Cardiovascular: Reports symptoms of   hypertension ABD/Gastrointestinal:  Musculoskeletal (BJE): NEURO/Neurological: PSYCH/Psychiatric:    Is the pt oriented to time, place, person?  Mood  normal    TOBACCO: Never smoker   ICD10: Z87.898 ICD9: V13.89     SOCIAL HISTORY: retired  FAMILY HISTORY: Positive family history for  -  HTN   Family History - 1st Degree Relatives:  Mother dead.  Father is dead.  ALLERGIES: Drug Allergies.  No Known.   Starter - Allergies - Summary:  PHYSICAL EXAMINATION: VS: BMI: 27.0.  BP: 119/68.  H: 65.00 in.  P: 60 /min.  RR: 20 /min.  W: 162lbs 0oz.    Va     OD:[place] 20/100  ph 20/70   CC 20/60+  PH 20/40 OS:[place] 20/40-2  ph NI       CC  20/30-2  PH 20/25-2  EYEGLASSES:  OD: +1.00 +1.25 x091 OS: -1.50 + 0.75 x063 ADD:+2.75  MR 09/19/2014 12:18   OD:  +1.50 +1.00 x093 20/40 OS:  -1.50 +0.50 x064 20/30 ADD: +2.75  K's OD: 47.00    axis 178       47.50    axis 088 OS: 47.00    axis 020        48.00   axis 110  VF:   YB:WLSL  to confrontation testing      OS  Full to confrontation testing     Motility: full versions orthophoria  PUPILS: 3 mm normal reaction OU negative Marcus Gunn  EYELIDS & OCULAR ADNEXAright lower lid cyst, normal OS  SLE: Conjunctiva quiet each eye  Cornea decreased tear film trace staining left  eye with endothelial pigment OS and arcus each eye GUTTAE/ PIGMENT    anterior chamber  deep and quiet each eye  Iris brown each eye  Lens  +3 nuclear sclerosis & +3 cortical opacties ANTERIOR VACOULE OD,  posterior chamber intraocular lens implant in good position OS  Vitreous  CCT  Ta   in mmHg    OD 10       OS 12 Time  Dilation   Fundus:  optic nerve  OD  slight discoloration 30% cup                                                                   OS slight discoloration 30% cup   Macula      OD CLEAR     OS CLEAR , RPE DROP OUT   Vessels silver wire change OU, NARROW ARTERIOLES   Periphery normal     Exam: GENERAL: Appearance: HEAD, EARS, NOSE AND THROAT: Ears-Nose (external) Inspection: Externally, nose and ears are normal in appearance and without scars, lesions, or nodules.      Hearing assessment shows no problems with normal conversation.       LUNGS and RESPIRATORY: Lung auscultation elicits no wheezing, rhonci, rales or rubs and with equal breath sounds.    Respiratory effort described as breathing is unlabored and chest movement is symmetrical.    HEART (Cardiovascular): Heart auscultation discovers regular rate and rhythm; no murmur, gallop or rub. Normal heart sounds.    ABDOMEN (Gastrointestinal): Mass/Tenderness Exam: Neither are present.     MUSCULOSKELETAL (BJE): Inspection-Palpation: No major bone, joint, tendon, or muscle changes.      NEUROLOGICAL: Alert and oriented. No major deficits of coordination or sensation.      PSYCHIATRIC: Insight and judgment appear  both to be intact and appropriate.    Mood and affect are described as normal mood and full affect.    SKIN: Skin Inspection: No rashes or lesions  ADMITTING DIAGNOSIS: Combined forms of age-related cataract, right eye   ICD10: H25.811   Hypertensive retinopathy   ICD10: H35.039   Macular degeneration   ICD10: H35.30  stable - PATIENT STABLE RECENTLY SEEN BY RETINA SPECIALIST NO CRNV EITHER EYE Dry eye syndrome   ICD10: H04.129  SURGICAL TREATMENT PLAN:     phaco emulsion cataract extraction  OD   intraocular lens implant     Risk and benefits of surgery have been reviewed with the patient and the patient agrees to proceed with the surgical procedure.    dilation today  Actions:     Handouts: Beta-Blockers.    ___________________________ Marylynn Pearson, Brooke Bonito Starter - Inactive Problems:

## 2015-04-03 NOTE — Transfer of Care (Signed)
Immediate Anesthesia Transfer of Care Note  Patient: [name]  Procedure(s) Performed: Procedure(s): CATARACT EXTRACTION PHACO AND INTRAOCULAR LENS PLACEMENT ([facility]) (Right)  Patient Location: PACU  Anesthesia Type:MAC  Level of Consciousness: awake, alert , oriented and patient cooperative  Airway & Oxygen Therapy: Patient Spontanous Breathing  Post-op Assessment: Report given to RN and Post -op Vital signs reviewed and stable  Post vital signs: Reviewed  Last Vitals:  Filed Vitals:   04/03/15 0721  BP: 128/61  Pulse: 63  Temp: 36.2 C  Resp: 20    Complications: No apparent anesthesia complications

## 2015-04-04 ENCOUNTER — Encounter (HOSPITAL_COMMUNITY): Payer: Self-pay | Admitting: Ophthalmology

## 2015-04-16 ENCOUNTER — Ambulatory Visit: Payer: Medicare Other

## 2015-05-10 ENCOUNTER — Ambulatory Visit
Admission: RE | Admit: 2015-05-10 | Discharge: 2015-05-10 | Disposition: A | Discharge status: Home / Self Care | Payer: Medicare Other | Source: Ambulatory Visit

## 2015-05-10 DIAGNOSIS — Z1231 Encounter for screening mammogram for malignant neoplasm of breast: Secondary | ICD-10-CM

## 2015-05-18 ENCOUNTER — Emergency Department (INDEPENDENT_AMBULATORY_CARE_PROVIDER_SITE_OTHER)
Admission: EM | Admit: 2015-05-18 | Discharge: 2015-05-18 | Disposition: A | Discharge status: Home / Self Care | Payer: Medicare Other | Source: Home / Self Care | Attending: Emergency Medicine | Admitting: Emergency Medicine

## 2015-05-18 ENCOUNTER — Encounter (HOSPITAL_COMMUNITY): Payer: Self-pay | Admitting: *Deleted

## 2015-05-18 DIAGNOSIS — H11431 Conjunctival hyperemia, right eye: Secondary | ICD-10-CM

## 2015-05-18 DIAGNOSIS — H5711 Ocular pain, right eye: Secondary | ICD-10-CM | POA: Diagnosis not present

## 2015-05-18 MED ORDER — KETOROLAC TROMETHAMINE 0.5 % OP SOLN: OPHTHALMIC | Status: DC

## 2015-05-18 MED ORDER — FLUORESCEIN SODIUM 1 MG OP STRP
ORAL_STRIP | OPHTHALMIC | Status: AC
  Filled 2015-05-18: qty 1

## 2015-05-18 MED ORDER — TETRACAINE HCL 0.5 % OP SOLN
OPHTHALMIC | Status: AC
  Filled 2015-05-18: qty 2

## 2015-05-18 NOTE — ED Provider Notes (Signed)
HPI  SUBJECTIVE:  [name] is a 79 y.o. female who presents with worsening right eye pain, erythema, irritation over the past few days after being started on a Cipro eyedrop. She reports photosensitivity. States that her eye feels like it has "gravel in it". She reports blurry peripheral vision which she says is new, increased tearing. Central vision is normal. She reports that her symptoms occur primarily at night, and wears off during the day. She only takes the Cipro at night. Symptoms are worse with light, the Cipro eyedrops, no alleviating factors. She has not tried anything for this. She is status post cataract surgery on10/12 was started on gatifloxacin and tobramycin, dexamethasone. She followed-up with Dr. Venetia Maxon, ophthalmology last week, and was found to have persistent inflammation, so the gatifloxacin, tobramycin/dexamethasone was discontinued and patient was started on Cipro. No nausea, vomiting, fevers, halos around lights, headache, eye drainage, pain worse in the dark. Past medical history of macular degeneration of the retina, cataract removal. Also hyperlipidemia, prediabetes, hypertension, sickle cell trait. No history of glaucoma.  Past Medical History  Diagnosis Date  . Palpitations     takes Sectral nightly  . Macular degeneration (senile) of retina, unspecified   . Nontoxic uninodular goiter   . Diffuse cystic mastopathy   . Hyperlipidemia   . History of colonoscopy   . AAA (abdominal aortic aneurysm) ([place])   . Pre-diabetes   . HTN (hypertension)     takes Azor daily   . Anxiety     takes Klonopin daily  as needed  . Vertigo     takes Meclizine daily as needed    Past Surgical History  Procedure Laterality Date  . Total knee arthroplasty    . Cataract extraction w/phaco Right 04/03/2015    Procedure: CATARACT EXTRACTION PHACO AND INTRAOCULAR LENS PLACEMENT (IOC);  Surgeon: Marylynn Pearson, MD;  Location: [facility];  Service: Ophthalmology;  Laterality: Right;     Family History  Problem Relation Age of Onset  . Colon cancer Neg Hx   . Cancer Neg Hx   . Heart disease Neg Hx   . Coronary artery disease Other     Social History  Substance Use Topics  . Smoking status: Never Smoker   . Smokeless tobacco: Never Used  . Alcohol Use: No    No current facility-administered medications for this encounter.  Current outpatient prescriptions:  .  aspirin EC 81 MG tablet, Take 81 mg by mouth 2 (two) times daily., Disp: , Rfl:  .  AZOR 10-20 MG tablet, Take 1 tablet by mouth daily., Disp: , Rfl: 1 .  acebutolol (SECTRAL) 200 MG capsule, Take 200 mg by mouth every evening., Disp: , Rfl: 2 .  clonazePAM (KLONOPIN) 0.5 MG tablet, TAKE 1 TABLET BY MOUTH TWICE A DAY AS NEEDED FOR ANXIETY, Disp: 60 tablet, Rfl: 0 .  CVS D3 5000 UNITS capsule, Take 5,000 Units by mouth daily., Disp: , Rfl: 2 .  ketorolac (ACULAR) 0.5 % ophthalmic solution, 1 drop in affected eye 2 times a day, Disp: 3 mL, Rfl: 0 .  meclizine (ANTIVERT) 25 MG tablet, TAKE 1 TABLET BY MOUTH 3 TIMES A DAY AS NEEDED VERTIGO, Disp: 50 tablet, Rfl: 0 .  Multiple Vitamins-Minerals (MULTIVITAMIN,TX-MINERALS) tablet, Take 1 tablet by mouth daily.  , Disp: , Rfl:   Allergies  Allergen Reactions  . Ibuprofen     REACTION: Blurry vision  . Rosuvastatin     REACTION: muscle aches  . Statins  ROS  As noted in HPI.   Physical Exam  BP 146/77 mmHg  Pulse 73  Temp(Src) 97.3 F (36.3 C) (Oral)  Resp 18  SpO2 95%  Constitutional: Well developed, well nourished, no acute distress Eyes:  EOMI, PERRLA, diffuse conjunctival injection worse in the medial aspect of the right eye. No photophobia. No consensual photophobia. No corneal abrasion, negative Seidel seen on slit lamp exam. Tono-Pen right eye 14, left eye 11. No periorbital erythema, edema. HENT: Normocephalic, atraumatic,mucus membranes moist Respiratory: Normal inspiratory effort Cardiovascular: Normal rate GI:  nondistended skin: No rash, skin intact Musculoskeletal: no deformities Neurologic: Alert & oriented x 3, no focal neuro deficits Psychiatric: Speech and behavior appropriate   ED Course   Medications - No data to display  No orders of the defined types were placed in this encounter.    No results found for this or any previous visit (from the past 24 hour(s)). No results found.  ED Clinical Impression  Eye pain, right  Conjunctival injection, right   ED Assessment/Plan  Perhaps patient is having an adverse reaction to the Cipro eyedrops. Does not appear to be glaucoma, no evidence of corneal abrasion, corneal ulcer or other ocular emergency. She tolerated gatifloxacin eyedrops. Paged Dr. Marylynn Pearson to discuss switching her back to gatifloxacin eyedrops and to ensure close follow-up  Discussed with Dr. Venetia Maxon, will discontinue Cipro, will not restart any antibiotic. He suggested either using Pred Forte 1 drop in affected eye 3 times a day or ketorolac 1 drop twice a day. Will start her on the ketorolac due to cost. She has tolerated NSAID eyedrops post surgery. Patient is to call the office on Monday and he will see her early this week. Discussed MDM, plan and followup with patient and family. Discussed sn/sx that should prompt return to the UC or ED. Patient agrees with plan.  *This clinic note was created using Dragon dictation software. Therefore, there may be occasional mistakes despite careful proofreading.  ?   Melynda Ripple, MD 05/19/15 1137

## 2015-05-18 NOTE — Discharge Instructions (Signed)
Stop the Cipro. Start the ketorolac 1 drop twice a day. Call Dr. Gertie Exon office on Monday and they will see you this week.  go to the ER for nausea, vomiting, fevers, severe headache, if you see halos around lights, pain worse in the dark or other concerns

## 2015-05-18 NOTE — ED Notes (Signed)
Dr. Alphonzo Cruise consulting with opthalmologist.

## 2015-05-18 NOTE — ED Notes (Signed)
Pt S/P right cataract surgery 04/03/15.  Was placed on abx eye drops at time of surgery.  At first f/u, Dr. Venetia Maxon noticed some slight inflammation in right eye; placed pt on another abx eye oint.  At another f/u appt this past week, pt still was having some inflammation in right eye - was given Cipro eye drops.  After first dose of Cipro at night, pt woke next morning with photosensitivity and eye pain and redness.  At dose during day, pt does not notice any significant change, but did again with an additional night dose.  Pt has f/u with Dr. Venetia Maxon this next week, but pain and inflammation are worse with sensation of "gravel in my eye".  States vision looking straight on is normal, but describes peripheral blurriness.

## 2015-08-26 ENCOUNTER — Emergency Department (HOSPITAL_COMMUNITY)
Admission: EM | Admit: 2015-08-26 | Discharge: 2015-08-27 | Disposition: A | Discharge status: Home / Self Care | Payer: Medicare Other | Attending: Physician Assistant | Admitting: Physician Assistant

## 2015-08-26 ENCOUNTER — Encounter (HOSPITAL_COMMUNITY): Payer: Self-pay | Admitting: *Deleted

## 2015-08-26 ENCOUNTER — Emergency Department (HOSPITAL_COMMUNITY): Payer: Medicare Other

## 2015-08-26 ENCOUNTER — Other Ambulatory Visit: Payer: Self-pay | Admitting: Ophthalmology

## 2015-08-26 DIAGNOSIS — H4710 Unspecified papilledema: Secondary | ICD-10-CM | POA: Diagnosis not present

## 2015-08-26 DIAGNOSIS — H5711 Ocular pain, right eye: Secondary | ICD-10-CM | POA: Insufficient documentation

## 2015-08-26 DIAGNOSIS — F419 Anxiety disorder, unspecified: Secondary | ICD-10-CM | POA: Diagnosis not present

## 2015-08-26 DIAGNOSIS — Z8639 Personal history of other endocrine, nutritional and metabolic disease: Secondary | ICD-10-CM | POA: Diagnosis not present

## 2015-08-26 DIAGNOSIS — Z79899 Other long term (current) drug therapy: Secondary | ICD-10-CM | POA: Diagnosis not present

## 2015-08-26 DIAGNOSIS — Z8742 Personal history of other diseases of the female genital tract: Secondary | ICD-10-CM | POA: Insufficient documentation

## 2015-08-26 DIAGNOSIS — H579 Unspecified disorder of eye and adnexa: Secondary | ICD-10-CM

## 2015-08-26 DIAGNOSIS — Z7982 Long term (current) use of aspirin: Secondary | ICD-10-CM | POA: Diagnosis not present

## 2015-08-26 DIAGNOSIS — I10 Essential (primary) hypertension: Secondary | ICD-10-CM | POA: Insufficient documentation

## 2015-08-26 LAB — I-STAT CHEM 8, ED
BUN: 16 mg/dL (ref 6–20)
Calcium, Ion: 1.19 mmol/L (ref 1.13–1.30)
Chloride: 106 mmol/L (ref 101–111)
Creatinine, Ser: 0.8 mg/dL (ref 0.44–1.00)
Glucose, Bld: 113 mg/dL — ABNORMAL HIGH (ref 65–99)
HCT: 40 % (ref 36.0–46.0)
Hemoglobin: 13.6 g/dL (ref 12.0–15.0)
Potassium: 3.9 mmol/L (ref 3.5–5.1)
Sodium: 142 mmol/L (ref 135–145)
TCO2: 22 mmol/L (ref 0–100)

## 2015-08-26 LAB — COMPREHENSIVE METABOLIC PANEL
ALT: 20 U/L (ref 14–54)
AST: 20 U/L (ref 15–41)
Albumin: 3.5 g/dL (ref 3.5–5.0)
Alkaline Phosphatase: 64 U/L (ref 38–126)
Anion gap: 9 (ref 5–15)
BUN: 17 mg/dL (ref 6–20)
CO2: 24 mmol/L (ref 22–32)
Calcium: 9.4 mg/dL (ref 8.9–10.3)
Chloride: 108 mmol/L (ref 101–111)
Creatinine, Ser: 0.76 mg/dL (ref 0.44–1.00)
GFR calc Af Amer: >60 mL/min (ref >60)
GFR calc non Af Amer: >60 mL/min (ref >60)
Glucose, Bld: 93 mg/dL (ref 65–99)
Potassium: 4.4 mmol/L (ref 3.5–5.1)
Sodium: 141 mmol/L (ref 135–145)
Total Bilirubin: 0.7 mg/dL (ref 0.3–1.2)
Total Protein: 7.1 g/dL (ref 6.5–8.1)

## 2015-08-26 LAB — CBC WITH DIFFERENTIAL/PLATELET
Basophils Absolute: 0 10*3/uL (ref 0.0–0.1)
Basophils Relative: 1 %
Eosinophils Absolute: 0.3 10*3/uL (ref 0.0–0.7)
Eosinophils Relative: 5 %
HCT: 33.8 % — ABNORMAL LOW (ref 36.0–46.0)
Hemoglobin: 12 g/dL (ref 12.0–15.0)
Lymphocytes Relative: 49 %
Lymphs Abs: 2.3 10*3/uL (ref 0.7–4.0)
MCH: 32.9 pg (ref 26.0–34.0)
MCHC: 35.5 g/dL (ref 30.0–36.0)
MCV: 92.6 fL (ref 78.0–100.0)
Monocytes Absolute: 0.4 10*3/uL (ref 0.1–1.0)
Monocytes Relative: 8 %
Neutro Abs: 1.8 10*3/uL (ref 1.7–7.7)
Neutrophils Relative %: 38 %
Platelets: 202 10*3/uL (ref 150–400)
RBC: 3.65 MIL/uL — ABNORMAL LOW (ref 3.87–5.11)
RDW: 13.6 % (ref 11.5–15.5)
WBC: 4.8 10*3/uL (ref 4.0–10.5)

## 2015-08-26 LAB — C-REACTIVE PROTEIN: CRP: <0.5 mg/dL (ref <1.0)

## 2015-08-26 MED ORDER — GADOBENATE DIMEGLUMINE 529 MG/ML IV SOLN
15.0000 mL | Freq: Once | INTRAVENOUS | Status: AC | PRN
  Administered 2015-08-26: 15 mL via INTRAVENOUS

## 2015-08-26 NOTE — ED Provider Notes (Signed)
Patient sent by ophthalmology for MR to evaluate for optic neuritis. Study ordered w&wo contrast to help expedite care.  Leo Grosser, MD 08/27/15 0200

## 2015-08-26 NOTE — ED Notes (Signed)
Pt went for eye dr exam today and sent here for MRI due to optic nerve being inflammed. No acute distress noted at triage.

## 2015-08-26 NOTE — ED Provider Notes (Signed)
CSN: [id number]     Arrival date & time 08/26/15  1424 History   First MD Initiated Contact with Patient 08/26/15 1828     Chief Complaint  Patient presents with  . Eye Problem     (Consider location/radiation/quality/duration/timing/severity/associated sxs/prior Treatment) HPI   Patient is a 80 year old female with history of stroke, diabetes, AAA, macular degeneration with recent cataract surgery to the right eye. Since her cataract surgery patient's been having pain in that eye. Patient went to follow up with ophthalmologist today. Ophthalmologist found questionable optic swelling so sent her to the emergency room. MRI was ordered at triage.    Past Medical History  Diagnosis Date  . Palpitations     takes Sectral nightly  . Macular degeneration (senile) of retina, unspecified   . Nontoxic uninodular goiter   . Diffuse cystic mastopathy   . Hyperlipidemia   . History of colonoscopy   . AAA (abdominal aortic aneurysm) ([place])   . Pre-diabetes   . HTN (hypertension)     takes Azor daily   . Anxiety     takes Klonopin daily  as needed  . Vertigo     takes Meclizine daily as needed   Past Surgical History  Procedure Laterality Date  . Total knee arthroplasty    . Cataract extraction w/phaco Right 04/03/2015    Procedure: CATARACT EXTRACTION PHACO AND INTRAOCULAR LENS PLACEMENT (IOC);  Surgeon: Marylynn Pearson, MD;  Location: [facility];  Service: Ophthalmology;  Laterality: Right;   Family History  Problem Relation Age of Onset  . Colon cancer Neg Hx   . Cancer Neg Hx   . Heart disease Neg Hx   . Coronary artery disease Other    Social History  Substance Use Topics  . Smoking status: Never Smoker   . Smokeless tobacco: Never Used  . Alcohol Use: No   OB History    No data available     Review of Systems  Constitutional: Negative for fever, activity change and fatigue.  HENT: Negative for congestion.   Eyes: Positive for pain. Negative for photophobia and visual  disturbance.  Respiratory: Negative for shortness of breath.   Cardiovascular: Negative for chest pain.  Gastrointestinal: Negative for abdominal pain.  Genitourinary: Negative for dysuria.  Musculoskeletal: Negative for back pain.  Neurological: Negative for syncope, facial asymmetry, speech difficulty, weakness, light-headedness, numbness and headaches.      Allergies  Ibuprofen; Rosuvastatin; and Statins  Home Medications   Prior to Admission medications   Medication Sig Start Date End Date Taking? Authorizing Provider  acebutolol (SECTRAL) 200 MG capsule Take 200 mg by mouth every evening. 01/30/15   Historical Provider, MD  aspirin EC 81 MG tablet Take 81 mg by mouth 2 (two) times daily.    Historical Provider, MD  AZOR 10-20 MG tablet Take 1 tablet by mouth daily. 03/04/15   Historical Provider, MD  clonazePAM (KLONOPIN) 0.5 MG tablet TAKE 1 TABLET BY MOUTH TWICE A DAY AS NEEDED FOR ANXIETY 04/25/12   Janith Lima, MD  CVS D3 5000 UNITS capsule Take 5,000 Units by mouth daily. 03/20/15   Historical Provider, MD  ketorolac (ACULAR) 0.5 % ophthalmic solution 1 drop in affected eye 2 times a day 05/18/15   Melynda Ripple, MD  meclizine (ANTIVERT) 25 MG tablet TAKE 1 TABLET BY MOUTH 3 TIMES A DAY AS NEEDED VERTIGO 09/28/11   Janith Lima, MD  Multiple Vitamins-Minerals (MULTIVITAMIN,TX-MINERALS) tablet Take 1 tablet by mouth daily.  Historical Provider, MD   BP 131/76 mmHg  Pulse 77  Temp(Src) 98 F (36.7 C) (Oral)  Resp 20  SpO2 99% Physical Exam  Constitutional: She is oriented to person, place, and time. She appears well-developed and well-nourished.  HENT:  Head: Normocephalic and atraumatic.  Eyes: Conjunctivae are normal. Right eye exhibits no discharge.  EOMI,   Neck: Neck supple.  Cardiovascular: Normal rate, regular rhythm and normal heart sounds.   No murmur heard. Pulmonary/Chest: Effort normal and breath sounds normal. She has no wheezes. She has no rales.   Abdominal: Soft. She exhibits no distension. There is no tenderness.  Musculoskeletal: Normal range of motion. She exhibits no edema.  Neurological: She is oriented to person, place, and time. No cranial nerve deficit.  Skin: Skin is warm and dry. She is not diaphoretic.  Psychiatric: She has a normal mood and affect. Her behavior is normal.  Nursing note and vitals reviewed.   ED Course  Procedures (including critical care time) Labs Review Labs Reviewed  I-STAT CHEM 8, ED - Abnormal; Notable for the following:    Glucose, Bld 113 (*)    All other components within normal limits    Imaging Review Mr Kizzie Fantasia Contrast  08/26/2015  CLINICAL DATA:  80 year old hypertensive female with blurred vision and cataract surgery 04/03/2015. Exam. Revealed optic nerve inflammation. Initial encounter. EXAM: MRI HEAD AND ORBITS WITHOUT AND WITH CONTRAST TECHNIQUE: Multiplanar, multiecho pulse sequences of the brain and surrounding structures were obtained without and with intravenous contrast. Multiplanar, multiecho pulse sequences of the orbits and surrounding structures were obtained including fat saturation techniques, before and after intravenous contrast administration. CONTRAST:  65m MULTIHANCE GADOBENATE DIMEGLUMINE 529 MG/ML IV SOLN COMPARISON:  07/06/2010 brain MR. FINDINGS: MRI HEAD FINDINGS No acute infarct or intracranial hemorrhage. 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 Remote tiny right cerebellar infarct. Moderate to marked small vessel disease changes have progressed since 2012 exam. Pituitary gland top-normal to slightly prominent for patient's age but without focal mass noted. Transverse ligament hypertrophy. Mild cervical spondylotic changes C2-3 and C3-4 incompletely assessed. Suggestion of mild cord flattening C3-4 level. Cervical medullary junction unremarkable.  Orbital structures as below. Mild maxillary sinus mucosal thickening. MRI ORBITS FINDINGS Exam is motion degraded. Minimal enhancement along the periphery of the globe may be related normal and accentuated by motion. With the degree of motion it would be difficult to completely excluded mild inflammation if of high clinical concern however, there are no surrounding findings to suggest inflammation. Normal symmetric extraocular muscles. Taking into account motion, no optic nerve abnormality is noted. Post lens replacement. No lacrimal gland abnormality. Minimal paranasal sinus mucosal thickening. IMPRESSION: MRI ORBITS FINDINGS Exam is motion degraded. Minimal enhancement along the periphery of the globe may be related normal and accentuated by motion. With the degree of motion it would be difficult to completely excluded mild inflammation if of high clinical concern however, there are no surrounding findings to suggest inflammation. Normal symmetric extraocular muscles. Taking into account motion, no optic nerve abnormality is noted. Post lens replacement. Minimal to mild paranasal sinus mucosal thickening. MRI HEAD No acute infarct. 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 Remote tiny right cerebellar infarct. Moderate to marked small vessel disease changes have progressed since 2012 exam. Pituitary gland top-normal to slightly prominent for patient's age but  without focal mass noted. Mild cervical spondylotic changes C2-3 and C3-4 incompletely assessed. Suggestion of mild cord flattening C3-4 level. Cervical medullary junction Electronically Signed   By: Genia Del M.D.   On: 08/26/2015 21:16   Mr Darnelle Catalan Wo/w Cm  08/26/2015  CLINICAL DATA:  80 year old hypertensive female with blurred vision and cataract surgery 04/03/2015. Exam. Revealed optic nerve inflammation. Initial encounter. EXAM: MRI HEAD AND ORBITS WITHOUT AND WITH CONTRAST  TECHNIQUE: Multiplanar, multiecho pulse sequences of the brain and surrounding structures were obtained without and with intravenous contrast. Multiplanar, multiecho pulse sequences of the orbits and surrounding structures were obtained including fat saturation techniques, before and after intravenous contrast administration. CONTRAST:  31m MULTIHANCE GADOBENATE DIMEGLUMINE 529 MG/ML IV SOLN COMPARISON:  07/06/2010 brain MR. FINDINGS: MRI HEAD FINDINGS No acute infarct or intracranial hemorrhage. 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 Remote tiny right cerebellar infarct. Moderate to marked small vessel disease changes have progressed since 2012 exam. Pituitary gland top-normal to slightly prominent for patient's age but without focal mass noted. Transverse ligament hypertrophy. Mild cervical spondylotic changes C2-3 and C3-4 incompletely assessed. Suggestion of mild cord flattening C3-4 level. Cervical medullary junction unremarkable. Orbital structures as below. Mild maxillary sinus mucosal thickening. MRI ORBITS FINDINGS Exam is motion degraded. Minimal enhancement along the periphery of the globe may be related normal and accentuated by motion. With the degree of motion it would be difficult to completely excluded mild inflammation if of high clinical concern however, there are no surrounding findings to suggest inflammation. Normal symmetric extraocular muscles. Taking into account motion, no optic nerve abnormality is noted. Post lens replacement. No lacrimal gland abnormality. Minimal paranasal sinus mucosal thickening. IMPRESSION: MRI ORBITS FINDINGS Exam is motion degraded. Minimal enhancement along the periphery of the globe may be related normal and accentuated by motion. With the degree of motion it would be difficult to completely excluded mild inflammation if of high clinical concern  however, there are no surrounding findings to suggest inflammation. Normal symmetric extraocular muscles. Taking into account motion, no optic nerve abnormality is noted. Post lens replacement. Minimal to mild paranasal sinus mucosal thickening. MRI HEAD No acute infarct. 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 Remote tiny right cerebellar infarct. Moderate to marked small vessel disease changes have progressed since 2012 exam. Pituitary gland top-normal to slightly prominent for patient's age but without focal mass noted. Mild cervical spondylotic changes C2-3 and C3-4 incompletely assessed. Suggestion of mild cord flattening C3-4 level. Cervical medullary junction Electronically Signed   By: SGenia DelM.D.   On: 08/26/2015 21:16   I have personally reviewed and evaluated these images and lab results as part of my medical decision-making.   EKG Interpretation None      MDM   Final diagnoses:  Eye problem  Optic disk edema    Patient is a 80year old female with past medical history of stroke, hypertension, vertigo macular degeneration presenting after eye surgery remotely. Patient's been having eye pain since the eye [place]. [place] went for follow-up in the noted to have optic disc swelling today. Sent here for MRI. MRI done in triage. MRI shows no optic nerve acute issues. Difficult to read patient's notes that she came with their handwritten. [facility] who saw her.  Note asks for  MRI, ESR CRP. These are pending. Also asks for potential LP. Given the patient has had this pain for several months, has no acute findings, no fever, no visual changes that are acute I do not think that an LP is warranted at this time.  9:59 PM Discussed with her optho doctor, he would like neuro to see for work up for optic disc edema.  Non arteritic ischemia optic  neuritis vs other cause (temportal arteritis). He is fine with close outpatiet follow. Optho doesn't think that LP is necessary today given MRI findings.   11:59 PM Discussed with neurology. We will do a referral to neurology to follow up within 1 week for the optic disc swelling. Neurology reviewed MRI and thinks that this is a reasonable outpatient follow-up plan. Awaiting ESR CRP. If either is elevated would start her on a prednisone burst until follow-up with neurology.   Courteney Julio Alm, MD 08/27/15 0002

## 2015-08-26 NOTE — ED Notes (Signed)
Pt is in MRI  

## 2015-08-27 LAB — SEDIMENTATION RATE: Sed Rate: 29 mm/hr — ABNORMAL HIGH (ref 0–22)

## 2015-08-27 MED ORDER — PREDNISONE 20 MG PO TABS: 60.0000 mg | ORAL_TABLET | Freq: Every day | ORAL | Status: DC

## 2015-08-27 NOTE — Discharge Instructions (Signed)
We talked to your ophthalmologist. We have sent the tests that he wanted [place] to send. He will be able to check on them in the morning. We have referred you to neurology. We've given you the phone number but also have given them your phone number. Take prednisone daily for 5 days for treatment.  Pain Without a Known Cause WHAT IS PAIN WITHOUT A KNOWN CAUSE? Pain can occur in any part of the body and can range from mild to severe. Sometimes no cause can be found for why you are having pain. Some types of pain that can occur without a known cause include:   Headache.  Back pain.  Abdominal pain.  Neck pain. HOW IS PAIN WITHOUT A KNOWN CAUSE DIAGNOSED?  Your health care provider will try to find the cause of your pain. This may include:  Physical exam.  Medical history.  Blood tests.  Urine tests.  X-rays. If no cause is found, your health care provider may diagnose you with pain without a known cause.  IS THERE TREATMENT FOR PAIN WITHOUT A CAUSE?  Treatment depends on the kind of pain you have. Your health care provider may prescribe medicines to help relieve your pain.  WHAT CAN I DO AT HOME FOR MY PAIN?   Take medicines only as directed by your health care provider.  Stop any activities that cause pain. During periods of severe pain, bed rest may help.  Try to reduce your stress with activities such as yoga or meditation. Talk to your health care provider for other stress-reducing activity recommendations.  Exercise regularly, if approved by your health care provider.  Eat a healthy diet that includes fruits and vegetables. This may improve pain. Talk to your health care provider if you have any questions about your diet. WHAT IF MY PAIN DOES NOT GET BETTER?  If you have a painful condition and no reason can be found for the pain or the pain gets worse, it is important to follow up with your health care provider. It may be necessary to repeat tests and look further for a  possible cause.    This information is not intended to replace advice given to you by your health care provider. Make sure you discuss any questions you have with your health care provider.   Document Released: 03/03/2001 Document Revised: 06/29/2014 Document Reviewed: 10/24/2013 Elsevier Interactive Patient Education [employer].

## 2015-08-27 NOTE — ED Provider Notes (Signed)
I was signed out as pending ESR for possible  temporal arteritis. ESR is elevated, will discharge with prednisone for 5 days.  Everlene Balls, MD 08/27/15 [phone]

## 2015-08-28 ENCOUNTER — Encounter: Payer: Self-pay | Admitting: Neurology

## 2015-08-28 ENCOUNTER — Ambulatory Visit (INDEPENDENT_AMBULATORY_CARE_PROVIDER_SITE_OTHER): Payer: Medicare Other | Admitting: Neurology

## 2015-08-28 VITALS — BP 109/61 | HR 75 | Temp 97.4°F | Ht 66.0 in | Wt 165.8 lb

## 2015-08-28 DIAGNOSIS — I638 Other cerebral infarction: Secondary | ICD-10-CM | POA: Diagnosis not present

## 2015-08-28 DIAGNOSIS — H469 Unspecified optic neuritis: Secondary | ICD-10-CM

## 2015-08-28 DIAGNOSIS — H468 Other optic neuritis: Secondary | ICD-10-CM | POA: Diagnosis not present

## 2015-08-28 DIAGNOSIS — I6389 Other cerebral infarction: Secondary | ICD-10-CM

## 2015-08-28 DIAGNOSIS — H53131 Sudden visual loss, right eye: Secondary | ICD-10-CM

## 2015-08-28 NOTE — Progress Notes (Addendum)
[facility]    Provider:  Dr Jaynee Eagles Referring Provider: Zenovia Jarred MD Primary Care Physician:  Benito Mccreedy, MD  CC:  Optic neuritis  HPI:  [name] is a 80 y.o. female here as a referral from the emergency room for eye pain possible right eye optic neuritis. Past medical history of macular degeneration of the retina, cataract removal, hyperlipidemia, prediabetes, hypertension, sickle cell trait, stroke. No history of glaucoma.Started after cataract surgery 04/03/2015. Started after cataract surgery and felt like gravel in the eye. She had been in the emergency room twice. After cataract surgery was started on was started on gatifloxacin and tobramycin, dexamethasone and then all were discontinued due to irritation and she was started on cipro in November. She had a dark spot in the middle of her vision which is better. She started steroid drops in the eyes. The floaters are better. She can't take prednisone pills because of side effects but she does have steroid eye drops. If she doesn't use the steroid drops her eyes hurt. Started right after the surgery. Never had any eye problems in the past only the cataracts. The gritty feeling in her eye is gone. The vision changes started after cipro eye drops. It does not hurt when she moves her eyes. She last saw her eye surgeon last month. [place] [facility] diagnosed her with optic nerve edema. This has been ongoing for 5 months now. She has not taken the steroid pills. Denies trauma. No headache previous or illnesses or any inciting event except the eye surgery. No double vision. No history of vision loss. No FHx of multiple sclerosis or autoimmune disorder. There was a stroke seen on the MRi and she takes aspirin but not daily. No nausea, vomiting, fevers, halos around lights, headache, eye drainage. She was started on prednisone 3m a day on 08/27/2015. Cardiology notes from 2012 state she has SVT (not aflutter or  afib).  Reviewed notes, labs and imaging from outside physicians, which showed:  Personally reviewed MRI of the brain and orbits and agree with the following:  Taking into account motion, no optic nerve abnormality is noted.Normal symmetric extraocular muscles.  No acute infarct.  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  Remote tiny right cerebellar infarct.  Moderate to marked small vessel disease changes have progressed since 2012 exam.  Pituitary gland top-normal to slightly prominent for patient's age but without focal mass noted.  Mild cervical spondylotic changes C2-3 and C3-4 incompletely assessed. Suggestion of mild cord flattening C3-4 level. Cervical medullary junction  She presented in the ED on 11/26 with worsening right eye pain, erythema after being started on cipro eye drops. Endorsed photosensitivity, gravel feeling in the eye, blurry peripheral vision, tearing, normal central vision mostly after taking the cipro drops. They saw no evidence of corneal abrasion, corneal ulcer or other ocular emergency. She tolerated gatifloxacin eyedrops. They paged Dr. RMarylynn Pearson they discontinued cipro and started her on ketorolac.   Review of Systems: Patient complains of symptoms per HPI as well as the following symptoms: Ringing in ears, constipation. Pertinent negatives per HPI. All others negative.   Social History   Social History  . Marital Status: Divorced    Spouse Name: N/A  . Number of Children: 2  . Years of Education: 12   Occupational History  . Not on file.   Social History Main Topics  . Smoking status: Never Smoker   . Smokeless tobacco:  Never Used  . Alcohol Use: No  . Drug Use: No  . Sexual Activity: No   Other Topics Concern  . Not on file   Social History Narrative   Lives with daughter   Caffeine use: none   Regular Exercise -  NO          Family History  Problem  Relation Age of Onset  . Colon cancer Neg Hx   . Cancer Neg Hx   . Heart disease Neg Hx   . Stroke Neg Hx   . Neuropathy Neg Hx   . Multiple sclerosis Neg Hx   . Migraines Neg Hx   . Coronary artery disease Other     Past Medical History  Diagnosis Date  . Palpitations     takes Sectral nightly  . Macular degeneration (senile) of retina, unspecified   . Nontoxic uninodular goiter   . Diffuse cystic mastopathy   . Hyperlipidemia   . History of colonoscopy   . AAA (abdominal aortic aneurysm) ([place])   . Pre-diabetes   . HTN (hypertension)     takes Azor daily   . Anxiety     takes Klonopin daily  as needed  . Vertigo     takes Meclizine daily as needed  . Headache     Past Surgical History  Procedure Laterality Date  . Total knee arthroplasty    . Cataract extraction w/phaco Right 04/03/2015    Procedure: CATARACT EXTRACTION PHACO AND INTRAOCULAR LENS PLACEMENT (IOC);  Surgeon: Marylynn Pearson, MD;  Location: [facility];  Service: Ophthalmology;  Laterality: Right;    Current Outpatient Prescriptions  Medication Sig Dispense Refill  . acebutolol (SECTRAL) 200 MG capsule Take 200 mg by mouth every evening.  2  . aspirin EC 81 MG tablet Take 81 mg by mouth daily.     . AZOR 10-20 MG tablet Take 1 tablet by mouth daily.  1  . clonazePAM (KLONOPIN) 0.5 MG tablet TAKE 1 TABLET BY MOUTH TWICE A DAY AS NEEDED FOR ANXIETY 60 tablet 0  . [employer] D3 5000 UNITS capsule Take 5,000 Units by mouth daily.  2  . ketorolac (ACULAR) 0.4 % SOLN Place 1 drop into the right eye 4 (four) times daily.    . meclizine (ANTIVERT) 25 MG tablet TAKE 1 TABLET BY MOUTH 3 TIMES A DAY AS NEEDED VERTIGO 50 tablet 0  . Multiple Vitamins-Minerals (MULTIVITAMIN,TX-MINERALS) tablet Take 1 tablet by mouth daily.      Vladimir Faster Glycol-Propyl Glycol (SYSTANE) 0.4-0.3 % SOLN Place 1 drop into the left eye 4 (four) times daily.    . prednisoLONE acetate (PRED FORTE) 1 % ophthalmic suspension Place 1 drop into the right eye  4 (four) times daily.    . predniSONE (DELTASONE) 20 MG tablet Take 3 tablets (60 mg total) by mouth daily. (Patient not taking: Reported on 08/28/2015) 15 tablet 0   No current facility-administered medications for this visit.    Allergies as of 08/28/2015 - Review Complete 08/28/2015  Allergen Reaction Noted  . Ibuprofen Other (See Comments) 09/12/2008  . Rosuvastatin Other (See Comments) 05/30/2010  . Statins Other (See Comments) 08/10/2011  . Prednisone  08/28/2015    Vitals: BP 109/61 mmHg  Pulse 75  Temp(Src) 97.4 F (36.3 C) (Oral)  Ht 5' 6" (1.676 m)  Wt 165 lb 12.8 oz (75.206 kg)  BMI 26.77 kg/m2 Last Weight:  Wt Readings from Last 1 Encounters:  08/28/15 165 lb 12.8 oz (75.206 kg)  Last Height:   Ht Readings from Last 1 Encounters:  08/28/15 5' 6" (1.676 m)   Physical exam: Exam: Gen: NAD, conversant, well nourised, obese, well groomed                     CV: RRR, no MRG. No Carotid Bruits. No peripheral edema, warm, nontender Eyes: Conjunctivae clear without exudates or hemorrhage  Neuro: Detailed Neurologic Exam  Speech:    Speech is normal; fluent and spontaneous with normal comprehension.  Cognition:    The patient is oriented to person, place, and time;     recent and remote memory intact;     language fluent;     normal attention, concentration,     fund of knowledge Cranial Nerves:    The pupils are equal, round, and reactive to light. Right ptosis, Right optic disk edema and pallor. Visual fields are full to finger confrontation. Extraocular movements are intact. Trigeminal sensation is intact and the muscles of mastication are normal. Right ptosis otherwise the face is symmetric. The palate elevates in the midline. Hearing intact. Voice is normal. Shoulder shrug is normal. The tongue has normal motion without fasciculations.   Coordination:    No dysmetria  Gait:    Not ataxic  Motor Observation:    No asymmetry, no atrophy, and no  involuntary movements noted. Tone:    Normal muscle tone.    Posture:    Posture is normal. normal erect    Strength:    Strength is V/V in the upper and lower limbs.      Sensation: intact to LT     Reflex Exam:  DTR's:    Deep tendon reflexes in the upper and lower extremities are symmetrical bilaterally.   Toes:    The toes are downgoing bilaterally.   Clonus:    Clonus is absent.      Assessment/Plan:  80 year old female with optic nerve edema that developed after cataract surgery 04/03/2015 and cipro eye drops over the last 5 months. MRI of the brain and orbits did not see any optic nerve abnormalities.  Will perform an extensive serum evaluation including NMO antibodies, b12, b1, b6, ANA, ace, cmp, cbc, hgba1c, lipid panel, sed rate and crp, tsh Visual evoked potentials F/u 4 weeks Encouraged her to take the oral steroids prescribed to her. ESR was only slightly elevated at 29 and actually decreased compared to last 5 years of esr (4 years ago 23). Can consider temporal artery biopsy or lumbar puncture. Will request records from [facility]: [facility].  I had a long d/w patient about her strokes seen on MRI of the brain, risk for recurrent stroke/TIAs, personally independently reviewed imaging studies and stroke evaluation results and answered questions.Continue ASA 29m daily for secondary stroke prevention and maintain strict control of hypertension with blood pressure goal below 130/90, diabetes with hemoglobin A1c goal below 6.5% and lipids with LDL cholesterol goal below 70 mg/dL.. Will check lipid panel and hgba1c today.  I also advised the patient to eat a healthy diet with plenty of whole grains, cereals, fruits and vegetables, exercise regularly and maintain ideal body weight .   ASarina Ill MD  [facility] [address] [address] Phone [phone] [phone]

## 2015-08-28 NOTE — Patient Instructions (Signed)
As far as your medications are concerned, I would like to suggest; Try 1/2 of one pill of steroids every day and increase to one whole pill.  As far as diagnostic testing: We will do a few more MRIs of brain and neck, labs, lumbar puncture  I would like to see you back in 4-5 weeks, sooner if we need to. Please call us with any interim questions, concerns, problems, updates or refill requests.   Our phone number is [phone]. We also have an after hours call service for urgent matters and there is a physician on-call for urgent questions. For any emergencies you know to call 911 or go to the nearest emergency room

## 2015-09-01 ENCOUNTER — Encounter: Payer: Self-pay | Admitting: Neurology

## 2015-09-01 DIAGNOSIS — H469 Unspecified optic neuritis: Secondary | ICD-10-CM | POA: Insufficient documentation

## 2015-09-01 LAB — VITAMIN B6: Vitamin B6: 23.1 ug/L (ref 2.0–32.8)

## 2015-09-01 LAB — ANA COMPREHENSIVE PANEL
Anti JO-1: <0.2 AI (ref 0.0–0.9)
Centromere Ab Screen: <0.2 AI (ref 0.0–0.9)
Chromatin Ab SerPl-aCnc: <0.2 AI (ref 0.0–0.9)
ENA RNP Ab: <0.2 AI (ref 0.0–0.9)
ENA SM Ab Ser-aCnc: <0.2 AI (ref 0.0–0.9)
ENA SSA (RO) Ab: <0.2 AI (ref 0.0–0.9)
ENA SSB (LA) Ab: <0.2 AI (ref 0.0–0.9)
Scleroderma SCL-70: <0.2 AI (ref 0.0–0.9)
dsDNA Ab: <1 IU/mL (ref 0–9)

## 2015-09-01 LAB — COMPREHENSIVE METABOLIC PANEL
ALT: 13 IU/L (ref 0–32)
AST: 13 IU/L (ref 0–40)
Albumin/Globulin Ratio: 1.5 (ref 1.1–2.5)
Albumin: 4.2 g/dL (ref 3.5–4.8)
Alkaline Phosphatase: 82 IU/L (ref 39–117)
BUN/Creatinine Ratio: 22 (ref 11–26)
BUN: 22 mg/dL (ref 8–27)
Bilirubin Total: 0.3 mg/dL (ref 0.0–1.2)
CO2: 25 mmol/L (ref 18–29)
Calcium: 9.3 mg/dL (ref 8.7–10.3)
Chloride: 101 mmol/L (ref 96–106)
Creatinine, Ser: 0.98 mg/dL (ref 0.57–1.00)
GFR calc Af Amer: 64 mL/min/{1.73_m2} (ref >59)
GFR calc non Af Amer: 56 mL/min/{1.73_m2} — ABNORMAL LOW (ref >59)
Globulin, Total: 2.8 g/dL (ref 1.5–4.5)
Glucose: 95 mg/dL (ref 65–99)
Potassium: 4.4 mmol/L (ref 3.5–5.2)
Sodium: 139 mmol/L (ref 134–144)
Total Protein: 7 g/dL (ref 6.0–8.5)

## 2015-09-01 LAB — CBC
Hematocrit: 33.9 % — ABNORMAL LOW (ref 34.0–46.6)
Hemoglobin: 11.3 g/dL (ref 11.1–15.9)
MCH: 31.2 pg (ref 26.6–33.0)
MCHC: 33.3 g/dL (ref 31.5–35.7)
MCV: 94 fL (ref 79–97)
Platelets: 227 10*3/uL (ref 150–379)
RBC: 3.62 x10E6/uL — ABNORMAL LOW (ref 3.77–5.28)
RDW: 14 % (ref 12.3–15.4)
WBC: 5.2 10*3/uL (ref 3.4–10.8)

## 2015-09-01 LAB — THYROID PANEL WITH TSH
Free Thyroxine Index: 2.3 (ref 1.2–4.9)
T3 Uptake Ratio: 27 % (ref 24–39)
T4, Total: 8.6 ug/dL (ref 4.5–12.0)
TSH: 0.791 u[IU]/mL (ref 0.450–4.500)

## 2015-09-01 LAB — LIPID PANEL
Chol/HDL Ratio: 3.6 ratio units (ref 0.0–4.4)
Cholesterol, Total: 203 mg/dL — ABNORMAL HIGH (ref 100–199)
HDL: 57 mg/dL (ref >39)
LDL Calculated: 112 mg/dL — ABNORMAL HIGH (ref 0–99)
Triglycerides: 172 mg/dL — ABNORMAL HIGH (ref 0–149)
VLDL Cholesterol Cal: 34 mg/dL (ref 5–40)

## 2015-09-01 LAB — NEUROMYELITIS OPTICA AUTOAB, IGG: NMO IgG Autoantibodies: <1.5 U/mL (ref 0.0–3.0)

## 2015-09-01 LAB — HEMOGLOBIN A1C
Est. average glucose Bld gHb Est-mCnc: 117 mg/dL
Hgb A1c MFr Bld: 5.7 % — ABNORMAL HIGH (ref 4.8–5.6)

## 2015-09-01 LAB — ANTINUCLEAR ANTIBODIES, IFA: ANA Titer 1: NEGATIVE

## 2015-09-01 LAB — ANGIOTENSIN CONVERTING ENZYME: Angio Convert Enzyme: 39 U/L (ref 14–82)

## 2015-09-01 LAB — B12 AND FOLATE PANEL
Folate: 17.1 ng/mL (ref >3.0)
Vitamin B-12: >2000 pg/mL — ABNORMAL HIGH (ref 211–946)

## 2015-09-01 LAB — VITAMIN B1: Thiamine: 95.8 nmol/L (ref 66.5–200.0)

## 2015-09-01 LAB — METHYLMALONIC ACID, SERUM: Methylmalonic Acid: 136 nmol/L (ref 0–378)

## 2015-09-02 ENCOUNTER — Telehealth: Payer: Self-pay | Admitting: Neurology

## 2015-09-02 NOTE — Telephone Encounter (Signed)
[name], can you call patient and let her know what I wrote below? If you can' treach her, please send her a letter thanks

## 2015-09-02 NOTE — Telephone Encounter (Signed)
Called patient to discuss labs. Home number mailbox full. Left message on her cell phone to call us back. Labs unremarkable except hgba1c 5.7 and ldl 112. Given her prior infarcts I recommend daily aspirin 81mg  and starting a statin for a goal ldl < 70. If patient calls back please discuss with her. thanks

## 2015-09-03 ENCOUNTER — Other Ambulatory Visit: Payer: Medicare Other

## 2015-09-03 NOTE — Telephone Encounter (Signed)
Dr Jaynee Eagles--  Called and spoke to pt about lab results per Dr Jaynee Eagles note. Pt already takes daily aspirin 81mg . She is going to continue to take this. She cannot take a statin. She has an allergy to this. She bleed through her eyes/mouth when she took this before. Advised I will let Dr Jaynee Eagles know. She verbalized understanding.

## 2015-09-04 ENCOUNTER — Telehealth: Payer: Self-pay | Admitting: Neurology

## 2015-09-04 ENCOUNTER — Other Ambulatory Visit: Payer: Self-pay | Admitting: Neurology

## 2015-09-04 DIAGNOSIS — H469 Unspecified optic neuritis: Secondary | ICD-10-CM

## 2015-09-04 NOTE — Telephone Encounter (Signed)
Please call patinet and daughter and let her know in addition to the tests I am going to perform I think she should also see someone who specializes in both neurology and ophthalmology, called a neuro- ophthalmologist. This is a specialist at [facility] I would like her to see given that the cause of her optic neuropathy is unclear. He is the best neuro[place]ophthalmologist [place]. I placed the referral. I still want to her to come to our office for the tests I ordered as well. thanks

## 2015-09-04 NOTE — Telephone Encounter (Signed)
LVM for pt daughter to call back. Gave [facility] phone number.  LVM for pt to call office back. Also gave [facility] phone number.

## 2015-09-05 NOTE — Telephone Encounter (Signed)
Pt returned Emma's call °

## 2015-09-05 NOTE — Telephone Encounter (Signed)
Abbe Amsterdam Faxed referral for Dr. Hassell Done apt August 4th at 10:00 am that's his first available . Per Dr. Earlie Server scheduler Dr. Hassell Done will review records and see if patient needs to come in sooner. All records have been faxed to [phone] telephone . [phone]. Looking at chart Im not sure if Dr. Jaynee Eagles has ordered any MRI's . Terrence Dupont will you tell the daughter when she call's back to please get her mother's MRI's for apt. She has had MRI's at [facility] and [facility] . Thanks Hinton Dyer.

## 2015-09-05 NOTE — Telephone Encounter (Signed)
LVM relaying message from Dr Jaynee Eagles and Hinton Dyer below. Gave [facility] phone number if she has further questions.

## 2015-09-08 ENCOUNTER — Encounter (HOSPITAL_COMMUNITY): Payer: Self-pay | Admitting: Emergency Medicine

## 2015-09-08 ENCOUNTER — Emergency Department (HOSPITAL_COMMUNITY)
Admission: EM | Admit: 2015-09-08 | Discharge: 2015-09-08 | Disposition: A | Discharge status: Home / Self Care | Payer: Medicare Other | Attending: Emergency Medicine | Admitting: Emergency Medicine

## 2015-09-08 ENCOUNTER — Emergency Department (HOSPITAL_COMMUNITY): Payer: Medicare Other

## 2015-09-08 DIAGNOSIS — Z7952 Long term (current) use of systemic steroids: Secondary | ICD-10-CM | POA: Insufficient documentation

## 2015-09-08 DIAGNOSIS — R51 Headache: Secondary | ICD-10-CM | POA: Diagnosis not present

## 2015-09-08 DIAGNOSIS — Z9889 Other specified postprocedural states: Secondary | ICD-10-CM | POA: Diagnosis not present

## 2015-09-08 DIAGNOSIS — H578 Other specified disorders of eye and adnexa: Secondary | ICD-10-CM | POA: Diagnosis not present

## 2015-09-08 DIAGNOSIS — Z87898 Personal history of other specified conditions: Secondary | ICD-10-CM | POA: Insufficient documentation

## 2015-09-08 DIAGNOSIS — R42 Dizziness and giddiness: Secondary | ICD-10-CM | POA: Diagnosis not present

## 2015-09-08 DIAGNOSIS — Z8639 Personal history of other endocrine, nutritional and metabolic disease: Secondary | ICD-10-CM | POA: Diagnosis not present

## 2015-09-08 DIAGNOSIS — I10 Essential (primary) hypertension: Secondary | ICD-10-CM | POA: Insufficient documentation

## 2015-09-08 DIAGNOSIS — Z7982 Long term (current) use of aspirin: Secondary | ICD-10-CM | POA: Insufficient documentation

## 2015-09-08 DIAGNOSIS — F419 Anxiety disorder, unspecified: Secondary | ICD-10-CM | POA: Diagnosis not present

## 2015-09-08 DIAGNOSIS — Z79899 Other long term (current) drug therapy: Secondary | ICD-10-CM | POA: Insufficient documentation

## 2015-09-08 DIAGNOSIS — R519 Headache, unspecified: Secondary | ICD-10-CM

## 2015-09-08 LAB — CBC WITH DIFFERENTIAL/PLATELET
Basophils Absolute: 0 10*3/uL (ref 0.0–0.1)
Basophils Relative: 0 %
Eosinophils Absolute: 0 10*3/uL (ref 0.0–0.7)
Eosinophils Relative: 0 %
HCT: 35.6 % — ABNORMAL LOW (ref 36.0–46.0)
Hemoglobin: 12.1 g/dL (ref 12.0–15.0)
Lymphocytes Relative: 18 %
Lymphs Abs: 1 10*3/uL (ref 0.7–4.0)
MCH: 31.8 pg (ref 26.0–34.0)
MCHC: 34 g/dL (ref 30.0–36.0)
MCV: 93.7 fL (ref 78.0–100.0)
Monocytes Absolute: 0.4 10*3/uL (ref 0.1–1.0)
Monocytes Relative: 6 %
Neutro Abs: 4.3 10*3/uL (ref 1.7–7.7)
Neutrophils Relative %: 76 %
Platelets: 179 10*3/uL (ref 150–400)
RBC: 3.8 MIL/uL — ABNORMAL LOW (ref 3.87–5.11)
RDW: 14.2 % (ref 11.5–15.5)
WBC: 5.7 10*3/uL (ref 4.0–10.5)

## 2015-09-08 LAB — BASIC METABOLIC PANEL
Anion gap: 10 (ref 5–15)
BUN: 21 mg/dL — ABNORMAL HIGH (ref 6–20)
CO2: 24 mmol/L (ref 22–32)
Calcium: 9.4 mg/dL (ref 8.9–10.3)
Chloride: 105 mmol/L (ref 101–111)
Creatinine, Ser: 0.78 mg/dL (ref 0.44–1.00)
GFR calc Af Amer: >60 mL/min (ref >60)
GFR calc non Af Amer: >60 mL/min (ref >60)
Glucose, Bld: 134 mg/dL — ABNORMAL HIGH (ref 65–99)
Potassium: 4.4 mmol/L (ref 3.5–5.1)
Sodium: 139 mmol/L (ref 135–145)

## 2015-09-08 LAB — I-STAT TROPONIN, ED: Troponin i, poc: 0 ng/mL (ref 0.00–0.08)

## 2015-09-08 MED ORDER — FENTANYL CITRATE (PF) 100 MCG/2ML IJ SOLN
50.0000 ug | Freq: Once | INTRAMUSCULAR | Status: AC
  Administered 2015-09-08: 50 ug via INTRAVENOUS
  Filled 2015-09-08: qty 2

## 2015-09-08 MED ORDER — METOCLOPRAMIDE HCL 5 MG/ML IJ SOLN
10.0000 mg | Freq: Once | INTRAMUSCULAR | Status: AC
  Administered 2015-09-08: 10 mg via INTRAVENOUS
  Filled 2015-09-08: qty 2

## 2015-09-08 MED ORDER — DIPHENHYDRAMINE HCL 50 MG/ML IJ SOLN
25.0000 mg | Freq: Once | INTRAMUSCULAR | Status: AC
  Administered 2015-09-08: 25 mg via INTRAVENOUS
  Filled 2015-09-08: qty 1

## 2015-09-08 MED ORDER — KETOROLAC TROMETHAMINE 15 MG/ML IJ SOLN
15.0000 mg | Freq: Once | INTRAMUSCULAR | Status: AC
  Administered 2015-09-08: 15 mg via INTRAVENOUS
  Filled 2015-09-08: qty 1

## 2015-09-08 MED ORDER — TRAMADOL HCL 50 MG PO TABS: 50.0000 mg | ORAL_TABLET | Freq: Four times a day (QID) | ORAL | Status: DC | PRN

## 2015-09-08 NOTE — ED Notes (Signed)
Pt presents to ED for assessment of head pressure/pain that began approx 9pm.  Pt has had recent visits to multiple providers for continuing headache.

## 2015-09-08 NOTE — ED Notes (Signed)
EDP at bedside  

## 2015-09-08 NOTE — ED Provider Notes (Signed)
CSN: [id number]     Arrival date & time 09/08/15  0103 History  By signing my name below, I, Hansel Feinstein, attest that this documentation has been prepared under the direction and in the presence of  [employer], PA-C. Electronically Signed: Hansel Feinstein, ED Scribe. 09/08/2015. 1:45 AM.     Chief Complaint  Patient presents with  . Headache   The history is provided by the patient and a relative. No language interpreter was used.   HPI Comments: [name] is a 80 y.o. female with h/o palpitations on qd sectral, HLD, AAA, HTN, vertigo on prn meclizine, HA who presents to the Emergency Department complaining of moderate, sudden onset, throbbing 9/10 right occipital HA onset 2 hours ago with associated dizziness. Pt has taken Excedrin with no relief of HA.  Pt was seen in the ED on 08/26/15 after referral from her ophthalmologist for questionable right eye swelling. She had MRI in the ED and then followed up with neurology with dx of right optic nerve neuritis. She was seen at [facility] and is scheduled on 3/28 for a visual evoked response test with Dr. Jaynee Eagles at [facility]. She is also in the process of being scheduled for LP by neurology. Ophthalmologist is Dr. Valetta Close. Pt is taking prednisolone optic drops, 20mg  tid prednisone, systane 4x/day, Ketorolac 0.4 4x/day in right eye. Denies neck pain, fevers, nausea, emesis. Daughter at bedside providing most of her history.   Past Medical History  Diagnosis Date  . Palpitations     takes Sectral nightly  . Macular degeneration (senile) of retina, unspecified   . Nontoxic uninodular goiter   . Diffuse cystic mastopathy   . Hyperlipidemia   . History of colonoscopy   . AAA (abdominal aortic aneurysm) ([place])   . Pre-diabetes   . HTN (hypertension)     takes Azor daily   . Anxiety     takes Klonopin daily  as needed  . Vertigo     takes Meclizine daily as needed  . Headache    Past Surgical History  Procedure  Laterality Date  . Total knee arthroplasty    . Cataract extraction w/phaco Right 04/03/2015    Procedure: CATARACT EXTRACTION PHACO AND INTRAOCULAR LENS PLACEMENT (IOC);  Surgeon: Marylynn Pearson, MD;  Location: [place];  Service: Ophthalmology;  Laterality: Right;   Family History  Problem Relation Age of Onset  . Colon cancer Neg Hx   . Cancer Neg Hx   . Heart disease Neg Hx   . Stroke Neg Hx   . Neuropathy Neg Hx   . Multiple sclerosis Neg Hx   . Migraines Neg Hx   . Coronary artery disease Other    Social History  Substance Use Topics  . Smoking status: Never Smoker   . Smokeless tobacco: Never Used  . Alcohol Use: No   OB History    No data available     Review of Systems  Constitutional: Negative for fever.  Gastrointestinal: Negative for nausea and vomiting.  Musculoskeletal: Negative for neck pain.  Neurological: Positive for dizziness and headaches.  All other systems reviewed and are negative.  Allergies  Ibuprofen; Rosuvastatin; Statins; and Prednisone  Home Medications   Prior to Admission medications   Medication Sig Start Date End Date Taking? Authorizing Provider  acebutolol (SECTRAL) 200 MG capsule Take 200 mg by mouth every evening. 01/30/15  Yes Historical Provider, MD  aspirin EC 81 MG tablet Take 81 mg by mouth daily.  Yes Historical Provider, MD  AZOR 10-20 MG tablet Take 1 tablet by mouth daily. 03/04/15  Yes Historical Provider, MD  clonazePAM (KLONOPIN) 0.5 MG tablet TAKE 1 TABLET BY MOUTH TWICE A DAY AS NEEDED FOR ANXIETY 04/25/12  Yes Janith Lima, MD  CVS D3 5000 UNITS capsule Take 5,000 Units by mouth once a week.  03/20/15  Yes Historical Provider, MD  ketorolac (ACULAR) 0.4 % SOLN Place 1 drop into the right eye 4 (four) times daily. 08/27/15  Yes Historical Provider, MD  meclizine (ANTIVERT) 25 MG tablet TAKE 1 TABLET BY MOUTH 3 TIMES A DAY AS NEEDED VERTIGO 09/28/11  Yes Janith Lima, MD  Multiple Vitamins-Minerals (MULTIVITAMIN,TX-MINERALS)  tablet Take 1 tablet by mouth 2 (two) times daily.    Yes Historical Provider, MD  Polyethyl Glycol-Propyl Glycol (SYSTANE) 0.4-0.3 % SOLN Place 1 drop into the left eye 4 (four) times daily.   Yes Historical Provider, MD  prednisoLONE acetate (PRED FORTE) 1 % ophthalmic suspension Place 1 drop into the right eye 4 (four) times daily. 08/27/15  Yes Historical Provider, MD  predniSONE (DELTASONE) 20 MG tablet Take 10 mg by mouth 2 (two) times daily.   Yes Historical Provider, MD  traMADol (ULTRAM) 50 MG tablet Take 1 tablet (50 mg total) by mouth every 6 (six) hours as needed (severe headaches). 09/08/15   Ankit Kathrynn Humble, MD   BP 113/63 mmHg  Pulse 47  Temp(Src) 98 F (36.7 C) (Oral)  Resp 13  Ht 5\' 6"  (1.676 m)  Wt 74.39 kg  BMI 26.48 kg/m2  SpO2 96% Physical Exam  Constitutional: She is oriented to person, place, and time. Vital signs are normal. She appears well-developed and well-nourished. No distress.  Appears in pain.   HENT:  Head: Normocephalic and atraumatic.  Eyes: Conjunctivae and EOM are normal.  Right PERRL.  Left pupil does not constrict as the right.  Neck: Normal range of motion. Neck supple.  Cardiovascular: Normal rate, regular rhythm and normal heart sounds.   No murmur heard. Pulmonary/Chest: Effort normal and breath sounds normal. No respiratory distress.  Musculoskeletal: Normal range of motion.  Neurological: She is alert and oriented to person, place, and time. She has normal strength. No sensory deficit. Coordination normal. GCS eye subscore is 4. GCS verbal subscore is 5. GCS motor subscore is 6.  Cranial nerves III-XII in tact.  GCS 15. No sensory or motor deficit.  Normal finger to nose bilaterally.   Skin: Skin is warm and dry.  Psychiatric: She has a normal mood and affect. Her behavior is normal.  Nursing note and vitals reviewed.   ED Course  Procedures (including critical care time) DIAGNOSTIC STUDIES: Oxygen Saturation is 95% on RA, adequate by my  interpretation.    COORDINATION OF CARE: 1:41 AM Discussed treatment plan with pt at bedside and pt agreed to plan.   Labs Review Labs Reviewed  CBC WITH DIFFERENTIAL/PLATELET - Abnormal; Notable for the following:    RBC 3.80 (*)    HCT 35.6 (*)    All other components within normal limits  BASIC METABOLIC PANEL - Abnormal; Notable for the following:    Glucose, Bld 134 (*)    BUN 21 (*)    All other components within normal limits  I-STAT TROPOININ, ED    Imaging Review Ct Head Wo Contrast  09/08/2015  CLINICAL DATA:  Headaches and pressure, onset 21:00. EXAM: CT HEAD WITHOUT CONTRAST TECHNIQUE: Contiguous axial images were obtained from the base of the  skull through the vertex without intravenous contrast. COMPARISON:  MRI 08/26/2015 FINDINGS: There is no intracranial hemorrhage, mass or evidence of acute infarction. There is mild generalized atrophy. There is moderate chronic white matter hypodensity consistent with microvascular ischemic change. There is no significant extra-axial fluid collection. No acute intracranial findings are evident. The atrophy and white matter changes are consistent with the findings of the recent MRI. The visible paranasal sinuses and mastoid air cells are clear. IMPRESSION: 1. No acute intracranial findings. 2. Mild generalized atrophy. Moderate white matter hypodensities consistent with small vessel ischemic disease, similar to the recent MRI. 3. Paranasal sinuses are clear where seen. Electronically Signed   By: Andreas Newport M.D.   On: 09/08/2015 02:48   I have personally reviewed and evaluated these images and lab results as part of my medical decision-making.   EKG Interpretation None      MDM   Final diagnoses:  Acute intractable headache, unspecified headache type  Patient presents for sudden onset headache with dizziness 2 hours ago.  Vitals and BP are stable. She appears in pain. Will CT head. Patient given fentanyl.  Patient had  recent diagnosis of optic neuritis by Dr. Valetta Close.  She also had MR of orbits and brain which showed inflammation. She is being followed by Dr. Jaynee Eagles, [facility] and is waiting for outpatient LP to be scheduled.  I discussed and signed this patient out to Dr. Lenoria Chime who will follow up with CT and labs.   Meds given in ED:  Medications  fentaNYL (SUBLIMAZE) injection 50 mcg (50 mcg Intravenous Given 09/08/15 0202)  ketorolac (TORADOL) 15 MG/ML injection 15 mg (15 mg Intravenous Given 09/08/15 0342)  metoCLOPramide (REGLAN) injection 10 mg (10 mg Intravenous Given 09/08/15 0342)  diphenhydrAMINE (BENADRYL) injection 25 mg (25 mg Intravenous Given 09/08/15 0342)    Discharge Medication List as of 09/08/2015  5:11 AM    START taking these medications   Details  traMADol (ULTRAM) 50 MG tablet Take 1 tablet (50 mg total) by mouth every 6 (six) hours as needed (severe headaches)., Starting 09/08/2015, Until Discontinued, Print         BP 113/63 mmHg  Pulse 47  Temp(Src) 98 F (36.7 C) (Oral)  Resp 13  Ht 5\' 6"  (1.676 m)  Wt 74.39 kg  BMI 26.48 kg/m2  SpO2 96%    I personally performed the services described in this documentation, which was scribed in my presence. The recorded information has been reviewed and is accurate.   Ottie Glazier, PA-C 09/08/15 2013  Varney Biles, MD 09/08/15 [phone]

## 2015-09-08 NOTE — Discharge Instructions (Signed)
We saw you in the ER for headaches. All the labs and imaging are normal. We are not sure what is causing your headaches, however, there appears to be no evidence of infection, bleeds or tumors based on our exam and results.  Please take over the counter pain meds round the clock for the next 6 hours, and take other meds prescribed only for break through pain. See your doctor if the pain persists, as you might need better medications or a specialist.  Please return to the ER if the headache gets severe and in not improving, you have associated new one sided numbness, tingling, weakness or confusion, seizures, poor balance or poor vision.  General Headache Without Cause A headache is pain or discomfort felt around the head or neck area. The specific cause of a headache may not be found. There are many causes and types of headaches. A few common ones are:  Tension headaches.  Migraine headaches.  Cluster headaches.  Chronic daily headaches. HOME CARE INSTRUCTIONS  Watch your condition for any changes. Take these steps to help with your condition: Managing Pain  Take over-the-counter and prescription medicines only as told by your health care provider.  Lie down in a dark, quiet room when you have a headache.  If directed, apply ice to the head and neck area:  Put ice in a plastic bag.  Place a towel between your skin and the bag.  Leave the ice on for 20 minutes, 2-3 times per day.  Use a heating pad or hot shower to apply heat to the head and neck area as told by your health care provider.  Keep lights dim if bright lights bother you or make your headaches worse. Eating and Drinking  Eat meals on a regular schedule.  Limit alcohol use.  Decrease the amount of caffeine you drink, or stop drinking caffeine. General Instructions  Keep all follow-up visits as told by your health care provider. This is important.  Keep a headache journal to help find out what may trigger your  headaches. For example, write down:  What you eat and drink.  How much sleep you get.  Any change to your diet or medicines.  Try massage or other relaxation techniques.  Limit stress.  Sit up straight, and do not tense your muscles.  Do not use tobacco products, including cigarettes, chewing tobacco, or e-cigarettes. If you need help quitting, ask your health care provider.  Exercise regularly as told by your health care provider.  Sleep on a regular schedule. Get 7-9 hours of sleep, or the amount recommended by your health care provider. SEEK MEDICAL CARE IF:   Your symptoms are not helped by medicine.  You have a headache that is different from the usual headache.  You have nausea or you vomit.  You have a fever. SEEK IMMEDIATE MEDICAL CARE IF:   Your headache becomes severe.  You have repeated vomiting.  You have a stiff neck.  You have a loss of vision.  You have problems with speech.  You have pain in the eye or ear.  You have muscular weakness or loss of muscle control.  You lose your balance or have trouble walking.  You feel faint or pass out.  You have confusion.   This information is not intended to replace advice given to you by your health care provider. Make sure you discuss any questions you have with your health care provider.   Document Released: 06/08/2005 Document Revised: 02/27/2015 Document  Reviewed: 10/01/2014 Elsevier Interactive Patient Education [employer].

## 2015-09-08 NOTE — ED Provider Notes (Signed)
Medical screening examination/treatment/procedure(s) were conducted as a shared visit with non-physician practitioner(s) and myself.  I personally evaluated the patient during the encounter.   EKG Interpretation None       Pt comes in with cc of headache. Being worked up for acute neuritis.  CN 2-12 intact Cerebellar exam is normal finger to nose and heel to shin Sensory exam normal for bilateral upper and lower extremities - and patient is able to discriminate between sharp and dull. Motor exam is 4+/5  She presented within 3 hours of the acute headache onset, which was severe right away. CT head is neg. Pt given fentanyl - and pain improved to 5/10.  Post toradol and reglan - pain resolved, but then returned to 4/10.  In the setting of no neuro deficits, normal neuro exam, no bruits, no nuchal rigidity - we dont see any reason to proceed with LP. Pt is supposed to have LP done this week for diagnostic purposes.  Strict ER return precautions have been discussed, and patient is agreeing with the plan and is comfortable with the workup done and the recommendations from the ER.     Varney Biles, MD 09/08/15 [phone]

## 2015-09-17 ENCOUNTER — Ambulatory Visit (INDEPENDENT_AMBULATORY_CARE_PROVIDER_SITE_OTHER): Payer: Medicare Other | Admitting: Neurology

## 2015-09-17 DIAGNOSIS — H53131 Sudden visual loss, right eye: Secondary | ICD-10-CM

## 2015-09-17 DIAGNOSIS — H469 Unspecified optic neuritis: Secondary | ICD-10-CM

## 2015-09-17 NOTE — Procedures (Signed)
    History:   [name] is a 80 year old patient with a history of cataract surgery in October 2016. The patient has had some visual changes centrally following the surgery, and some sensation of irritation in the eye on the right. The patient is being evaluated for possible optic neuritis.  Description: The visual evoked response test was performed today using 32 x 32 check sizes. The absolute latencies for the N1 and the P100 wave forms were within normal limits bilaterally. The amplitudes for the P100 wave forms were also within normal limits bilaterally. The visual acuity was 20/100 OD and 20/40 OS uncorrected.  Impression:  The visual evoked response test above was within normal limits bilaterally. No evidence of conduction slowing was seen within the anterior visual pathways on either side on today's evaluation.

## 2015-09-18 ENCOUNTER — Other Ambulatory Visit: Payer: Self-pay | Admitting: Neurology

## 2015-09-18 ENCOUNTER — Telehealth: Payer: Self-pay | Admitting: Neurology

## 2015-09-18 DIAGNOSIS — H4710 Unspecified papilledema: Secondary | ICD-10-CM

## 2015-09-18 DIAGNOSIS — H53131 Sudden visual loss, right eye: Secondary | ICD-10-CM

## 2015-09-18 DIAGNOSIS — H05111 Granuloma of right orbit: Secondary | ICD-10-CM

## 2015-09-20 ENCOUNTER — Other Ambulatory Visit: Payer: Self-pay | Admitting: Neurology

## 2015-09-20 ENCOUNTER — Telehealth: Payer: Self-pay | Admitting: Neurology

## 2015-09-20 DIAGNOSIS — H469 Unspecified optic neuritis: Secondary | ICD-10-CM

## 2015-09-20 NOTE — Telephone Encounter (Signed)
Patient's daughter Rosann Auerbach is returning a call regarding the patient. She can be reached at [phone].

## 2015-09-20 NOTE — Telephone Encounter (Signed)
Spoke to daughter and to Dr. Valetta Close who is seeing patient. Will order a lumbar puncture. Discussed testing for the following infectious causes of ON such as :herpes Zoster, Lyme disease, syphilis, tuberculosis or toxoplasmosis, bartonella, cells. Will repeat MRI orbits.  Dr Lamarr Lulas Cell [phone]

## 2015-09-20 NOTE — Telephone Encounter (Signed)
Spoke with Dr. Tama High, patient reports she has a follow up scheduled with him. He saw her in the ED. LP will be low yield. She is on steroids but unlikely temporal arteritis. Will hold off before ordering anything more. Called left message for patient, will call back again.

## 2015-09-20 NOTE — Telephone Encounter (Signed)
Dr Jaynee Eagles- Pt daughter returning your call, please call [phone]

## 2015-09-20 NOTE — Telephone Encounter (Signed)
Dr. Brigitte Pulse with [facility] returned Dr. Cathren Laine phone call. May call his cell at [phone]. Thank you

## 2015-09-21 NOTE — Telephone Encounter (Signed)
error 

## 2015-09-26 ENCOUNTER — Telehealth: Payer: Self-pay | Admitting: *Deleted

## 2015-09-26 ENCOUNTER — Other Ambulatory Visit: Payer: Self-pay | Admitting: Cardiology

## 2015-09-26 NOTE — Telephone Encounter (Signed)
Dr Jaynee Eagles- please advise  [name] called from [facility]. She would like to know if Dr Jaynee Eagles would like to order IgG antibody or current infection for toxoplasma. Advised I will speak to Dr Jaynee Eagles and call her back to advise. She verbalized understanding.

## 2015-09-27 ENCOUNTER — Other Ambulatory Visit: Payer: Self-pay | Admitting: Cardiology

## 2015-09-27 DIAGNOSIS — I712 Thoracic aortic aneurysm, without rupture, unspecified: Secondary | ICD-10-CM

## 2015-09-30 NOTE — Telephone Encounter (Signed)
Called [place] at [facility]. Advised Dr Jaynee Eagles would like current infection for toxoplasma. She verbalized understanding.

## 2015-09-30 NOTE — Telephone Encounter (Signed)
Current infection thanks

## 2015-10-02 ENCOUNTER — Telehealth: Payer: Self-pay | Admitting: Neurology

## 2015-10-02 NOTE — Telephone Encounter (Signed)
Patient has an appointment on the 20th. I don't think her LP is until the 18th. If so,  I won't have many results back from LP in 2 days, would prefer patient to reschedule for the first week of May if possible so we can see what the results show and discuss. I don;t need an hour with patient, 1/2 hour is fine. We can open up a 4pm, 4:30 or noon for her if I don't have any follow ups available. thanks

## 2015-10-02 NOTE — Telephone Encounter (Signed)
Spoke to patient - she has been rescheduled for 10/24/15.

## 2015-10-04 ENCOUNTER — Inpatient Hospital Stay: Admission: RE | Admit: 2015-10-04 | Payer: Medicare Other | Source: Ambulatory Visit

## 2015-10-04 ENCOUNTER — Other Ambulatory Visit: Payer: Medicare Other

## 2015-10-07 ENCOUNTER — Ambulatory Visit
Admission: RE | Admit: 2015-10-07 | Discharge: 2015-10-07 | Disposition: A | Discharge status: Home / Self Care | Payer: Medicare Other | Source: Ambulatory Visit | Attending: Cardiology | Admitting: Cardiology

## 2015-10-07 ENCOUNTER — Ambulatory Visit
Admission: RE | Admit: 2015-10-07 | Discharge: 2015-10-07 | Disposition: A | Discharge status: Home / Self Care | Payer: Medicare Other | Source: Ambulatory Visit | Attending: Neurology | Admitting: Neurology

## 2015-10-07 ENCOUNTER — Other Ambulatory Visit: Payer: Medicare Other

## 2015-10-07 ENCOUNTER — Other Ambulatory Visit: Payer: Self-pay | Admitting: Neurology

## 2015-10-07 ENCOUNTER — Other Ambulatory Visit (HOSPITAL_COMMUNITY)
Admission: RE | Admit: 2015-10-07 | Discharge: 2015-10-07 | Disposition: A | Discharge status: Home / Self Care | Payer: Medicare Other | Source: Ambulatory Visit | Attending: Neurology | Admitting: Neurology

## 2015-10-07 DIAGNOSIS — I712 Thoracic aortic aneurysm, without rupture, unspecified: Secondary | ICD-10-CM

## 2015-10-07 DIAGNOSIS — H469 Unspecified optic neuritis: Secondary | ICD-10-CM

## 2015-10-07 MED ORDER — IOPAMIDOL (ISOVUE-370) INJECTION 76%
80.0000 mL | Freq: Once | INTRAVENOUS | Status: AC | PRN
  Administered 2015-10-07: 80 mL via INTRAVENOUS

## 2015-10-07 NOTE — Discharge Instructions (Signed)

## 2015-10-07 NOTE — Progress Notes (Signed)
Patient's daughter not available at 22 for discharge, so patient back in nursing station to lie down for about 15 minutes until daughter gets here.

## 2015-10-10 ENCOUNTER — Ambulatory Visit: Payer: Medicare Other | Admitting: Neurology

## 2015-10-16 ENCOUNTER — Inpatient Hospital Stay: Admission: RE | Admit: 2015-10-16 | Payer: Medicare Other | Source: Ambulatory Visit

## 2015-10-24 ENCOUNTER — Ambulatory Visit: Payer: Self-pay | Admitting: Neurology

## 2015-10-24 ENCOUNTER — Telehealth: Payer: Self-pay | Admitting: *Deleted

## 2015-10-24 NOTE — Telephone Encounter (Signed)
Called and spoke to [place] from [facility]. She is going to fax results of LP from [facility] to [phone].

## 2015-10-24 NOTE — Telephone Encounter (Signed)
no showed f/u 

## 2015-10-24 NOTE — Telephone Encounter (Signed)
She is coming in today thanks

## 2015-10-24 NOTE — Telephone Encounter (Signed)
Called [name] at [facility] per Dr Jaynee Eagles request. Pt had LP a couple weeks ago and Dr Jaynee Eagles cannot see results. Asked her to call back to discuss. Gave [facility] phone number.

## 2015-10-24 NOTE — Telephone Encounter (Signed)
Dr Jaynee Eagles- I am happy to call patient. Can you just let me know what you would like me to tell her about results? Thank you!

## 2015-10-24 NOTE — Telephone Encounter (Signed)
Received results from [place]. Dr Jaynee Eagles aware.

## 2015-10-25 ENCOUNTER — Encounter: Payer: Self-pay | Admitting: Neurology

## 2015-11-11 LAB — FUNGUS CULTURE W SMEAR

## 2015-11-14 ENCOUNTER — Encounter (HOSPITAL_COMMUNITY): Payer: Self-pay

## 2015-11-14 ENCOUNTER — Ambulatory Visit (HOSPITAL_COMMUNITY)
Admission: EM | Admit: 2015-11-14 | Discharge: 2015-11-14 | Disposition: A | Discharge status: Home / Self Care | Payer: Medicare Other | Attending: Family Medicine | Admitting: Family Medicine

## 2015-11-14 DIAGNOSIS — K921 Melena: Secondary | ICD-10-CM

## 2015-11-14 LAB — POCT I-STAT, CHEM 8
BUN: 26 mg/dL — ABNORMAL HIGH (ref 6–20)
Calcium, Ion: 1.18 mmol/L (ref 1.13–1.30)
Chloride: 103 mmol/L (ref 101–111)
Creatinine, Ser: 0.9 mg/dL (ref 0.44–1.00)
Glucose, Bld: 104 mg/dL — ABNORMAL HIGH (ref 65–99)
HCT: 36 % (ref 36.0–46.0)
Hemoglobin: 12.2 g/dL (ref 12.0–15.0)
Potassium: 4.5 mmol/L (ref 3.5–5.1)
Sodium: 139 mmol/L (ref 135–145)
TCO2: 23 mmol/L (ref 0–100)

## 2015-11-14 LAB — OCCULT BLOOD, POC DEVICE: Fecal Occult Bld: NEGATIVE

## 2015-11-14 NOTE — ED Notes (Signed)
Pt stated that she had a dark stool once yesterday Pt has not had any other discomfort

## 2015-11-14 NOTE — Discharge Instructions (Signed)
Your initial stool test today was negative there was no blood found on examination today.  Your initial blood counts today are normal which means that there is no significant blood loss if any.  Be sure to follow-up with your doctor tomorrow.  You may wish to take a stool specimen with few to be checked for blood tomorrow also.  If you need further evaluation your primary care provider can arrange this.

## 2015-11-14 NOTE — ED Provider Notes (Signed)
CSN: [id number]     Arrival date & time 11/14/15  1814 History   First MD Initiated Contact with Patient 11/14/15 1857     Chief Complaint  Patient presents with  . Melena   (Consider location/radiation/quality/duration/timing/severity/associated sxs/prior Treatment) HPI History obtained from patient:  Pt presents with the cc of:  Black stools Duration of symptoms: Couple of days Treatment prior to arrival: Using laxatives Context: Patient states that she developed dark stools in the last couple of days. Not tarry in consistency. No significant odor. Other symptoms include: None Pain score: 0 FAMILY HISTORY: Negative for colon cancer    Past Medical History  Diagnosis Date  . Palpitations     takes Sectral nightly  . Macular degeneration (senile) of retina, unspecified   . Nontoxic uninodular goiter   . Diffuse cystic mastopathy   . Hyperlipidemia   . History of colonoscopy   . AAA (abdominal aortic aneurysm) ([place])   . Pre-diabetes   . HTN (hypertension)     takes Azor daily   . Anxiety     takes Klonopin daily  as needed  . Vertigo     takes Meclizine daily as needed  . Headache    Past Surgical History  Procedure Laterality Date  . Total knee arthroplasty    . Cataract extraction w/phaco Right 04/03/2015    Procedure: CATARACT EXTRACTION PHACO AND INTRAOCULAR LENS PLACEMENT (IOC);  Surgeon: Marylynn Pearson, MD;  Location: [facility];  Service: Ophthalmology;  Laterality: Right;   Family History  Problem Relation Age of Onset  . Colon cancer Neg Hx   . Cancer Neg Hx   . Heart disease Neg Hx   . Stroke Neg Hx   . Neuropathy Neg Hx   . Multiple sclerosis Neg Hx   . Migraines Neg Hx   . Coronary artery disease Other    Social History  Substance Use Topics  . Smoking status: Never Smoker   . Smokeless tobacco: Never Used  . Alcohol Use: No   OB History    No data available     Review of Systems  Denies: HEADACHE, NAUSEA, ABDOMINAL PAIN, CHEST PAIN, CONGESTION,  DYSURIA, SHORTNESS OF BREATH   Allergies  Statins; Prednisone; and Ibuprofen  Home Medications   Prior to Admission medications   Medication Sig Start Date End Date Taking? Authorizing Provider  acebutolol (SECTRAL) 200 MG capsule Take 200 mg by mouth every evening. 01/30/15   Historical Provider, MD  aspirin EC 81 MG tablet Take 81 mg by mouth daily.     Historical Provider, MD  AZOR 10-20 MG tablet Take 1 tablet by mouth daily. 03/04/15   Historical Provider, MD  clonazePAM (KLONOPIN) 0.5 MG tablet TAKE 1 TABLET BY MOUTH TWICE A DAY AS NEEDED FOR ANXIETY 04/25/12   Janith Lima, MD  CVS D3 5000 UNITS capsule Take 5,000 Units by mouth once a week.  03/20/15   Historical Provider, MD  ketorolac (ACULAR) 0.4 % SOLN Place 1 drop into the right eye 4 (four) times daily. 08/27/15   Historical Provider, MD  meclizine (ANTIVERT) 25 MG tablet TAKE 1 TABLET BY MOUTH 3 TIMES A DAY AS NEEDED VERTIGO 09/28/11   Janith Lima, MD  Multiple Vitamins-Minerals (MULTIVITAMIN,TX-MINERALS) tablet Take 1 tablet by mouth 2 (two) times daily.     Historical Provider, MD  Polyethyl Glycol-Propyl Glycol (SYSTANE) 0.4-0.3 % SOLN Place 1 drop into the left eye 4 (four) times daily.    Historical Provider, MD  prednisoLONE acetate (PRED FORTE) 1 % ophthalmic suspension Place 1 drop into the right eye 4 (four) times daily. 08/27/15   Historical Provider, MD  predniSONE (DELTASONE) 20 MG tablet Take 10 mg by mouth 2 (two) times daily.    Historical Provider, MD  traMADol (ULTRAM) 50 MG tablet Take 1 tablet (50 mg total) by mouth every 6 (six) hours as needed (severe headaches). 09/08/15   Varney Biles, MD   Meds Ordered and Administered this Visit  Medications - No data to display  BP 127/68 mmHg  Pulse 74  Temp(Src) 98.2 F (36.8 C) (Oral)  Resp 12  SpO2 100% No data found.   Physical Exam NURSES NOTES AND VITAL SIGNS REVIEWED. CONSTITUTIONAL: Well developed, well nourished, no acute distress HEENT:  normocephalic, atraumatic EYES: Conjunctiva normal NECK:normal ROM, supple, no adenopathy PULMONARY:No respiratory distress, normal effort ABDOMINAL: Soft, ND, NT BS+, No CVAT, Rectal exam heme neg, external hemorrhoid noted.  MUSCULOSKELETAL: Normal ROM of all extremities,  SKIN: warm and dry without rash PSYCHIATRIC: Mood and affect, behavior are normal  ED Course  Procedures (including critical care time)  Labs Review Labs Reviewed  POCT I-STAT, CHEM 8 - Abnormal; Notable for the following:    BUN 26 (*)    Glucose, Bld 104 (*)    All other components within normal limits  OCCULT BLOOD, POC DEVICE  I HAVE PERSONALLY REVIEWED TESTS RESULTS WITH PATIENT.  Imaging Review No results found.   Visual Acuity Review  Right Eye Distance:   Left Eye Distance:   Bilateral Distance:    Right Eye Near:   Left Eye Near:    Bilateral Near:      There is no emergent pathology that pt needs to go to the ER for at this time.  She should see her doctor tomorrow for further testing.   May wish to take stool sample to appointment for PCP to test in office.    MDM  No diagnosis found.  Patient is reassured that there are no issues that require transfer to higher level of care at this time or additional tests. Patient is advised to continue home symptomatic treatment. Patient is advised that if there are new or worsening symptoms to attend the emergency department, contact primary care provider, or return to UC. Instructions of care provided discharged home in stable condition.    THIS NOTE WAS GENERATED USING A VOICE RECOGNITION SOFTWARE PROGRAM. ALL REASONABLE EFFORTS  WERE MADE TO PROOFREAD THIS DOCUMENT FOR ACCURACY.  I have verbally reviewed the discharge instructions with the patient. A printed AVS was given to the patient.  All questions were answered prior to discharge.      Konrad Felix, PA 11/14/15 1924

## 2015-11-26 ENCOUNTER — Telehealth: Payer: Self-pay | Admitting: Gastroenterology

## 2015-11-26 NOTE — Telephone Encounter (Signed)
Left message on machine to call back  

## 2015-11-27 ENCOUNTER — Observation Stay (HOSPITAL_COMMUNITY)
Admission: EM | Admit: 2015-11-27 | Discharge: 2015-11-30 | Disposition: A | Discharge status: Home / Self Care | Payer: Medicare Other | Attending: Internal Medicine | Admitting: Internal Medicine

## 2015-11-27 ENCOUNTER — Other Ambulatory Visit: Payer: Self-pay

## 2015-11-27 ENCOUNTER — Encounter (HOSPITAL_COMMUNITY): Payer: Self-pay | Admitting: Emergency Medicine

## 2015-11-27 DIAGNOSIS — D62 Acute posthemorrhagic anemia: Secondary | ICD-10-CM | POA: Diagnosis not present

## 2015-11-27 DIAGNOSIS — Z96659 Presence of unspecified artificial knee joint: Secondary | ICD-10-CM | POA: Diagnosis not present

## 2015-11-27 DIAGNOSIS — K922 Gastrointestinal hemorrhage, unspecified: Secondary | ICD-10-CM | POA: Diagnosis not present

## 2015-11-27 DIAGNOSIS — Z7952 Long term (current) use of systemic steroids: Secondary | ICD-10-CM | POA: Diagnosis not present

## 2015-11-27 DIAGNOSIS — Z79899 Other long term (current) drug therapy: Secondary | ICD-10-CM | POA: Diagnosis not present

## 2015-11-27 DIAGNOSIS — K3182 Dieulafoy lesion (hemorrhagic) of stomach and duodenum: Principal | ICD-10-CM | POA: Insufficient documentation

## 2015-11-27 DIAGNOSIS — F419 Anxiety disorder, unspecified: Secondary | ICD-10-CM | POA: Insufficient documentation

## 2015-11-27 DIAGNOSIS — D649 Anemia, unspecified: Secondary | ICD-10-CM | POA: Diagnosis present

## 2015-11-27 DIAGNOSIS — K625 Hemorrhage of anus and rectum: Secondary | ICD-10-CM | POA: Diagnosis present

## 2015-11-27 DIAGNOSIS — R42 Dizziness and giddiness: Secondary | ICD-10-CM | POA: Diagnosis not present

## 2015-11-27 DIAGNOSIS — M545 Low back pain, unspecified: Secondary | ICD-10-CM

## 2015-11-27 DIAGNOSIS — K5900 Constipation, unspecified: Secondary | ICD-10-CM | POA: Diagnosis not present

## 2015-11-27 DIAGNOSIS — K921 Melena: Secondary | ICD-10-CM | POA: Insufficient documentation

## 2015-11-27 DIAGNOSIS — R51 Headache: Secondary | ICD-10-CM | POA: Diagnosis not present

## 2015-11-27 DIAGNOSIS — Z7982 Long term (current) use of aspirin: Secondary | ICD-10-CM | POA: Insufficient documentation

## 2015-11-27 DIAGNOSIS — I10 Essential (primary) hypertension: Secondary | ICD-10-CM | POA: Diagnosis not present

## 2015-11-27 DIAGNOSIS — Z791 Long term (current) use of non-steroidal anti-inflammatories (NSAID): Secondary | ICD-10-CM | POA: Insufficient documentation

## 2015-11-27 DIAGNOSIS — D500 Iron deficiency anemia secondary to blood loss (chronic): Secondary | ICD-10-CM

## 2015-11-27 HISTORY — DX: Gastro-esophageal reflux disease without esophagitis: K21.9

## 2015-11-27 HISTORY — DX: Thoracic aortic aneurysm, without rupture: I71.2

## 2015-11-27 LAB — PREPARE RBC (CROSSMATCH)

## 2015-11-27 LAB — CBC WITH DIFFERENTIAL/PLATELET
Basophils Absolute: 0 10*3/uL (ref 0.0–0.1)
Basophils Relative: 0 %
Eosinophils Absolute: 0.2 10*3/uL (ref 0.0–0.7)
Eosinophils Relative: 4 %
HCT: 21.1 % — ABNORMAL LOW (ref 36.0–46.0)
Hemoglobin: 7.1 g/dL — ABNORMAL LOW (ref 12.0–15.0)
Lymphocytes Relative: 34 %
Lymphs Abs: 2 10*3/uL (ref 0.7–4.0)
MCH: 32.7 pg (ref 26.0–34.0)
MCHC: 33.6 g/dL (ref 30.0–36.0)
MCV: 97.2 fL (ref 78.0–100.0)
Monocytes Absolute: 0.4 10*3/uL (ref 0.1–1.0)
Monocytes Relative: 6 %
Neutro Abs: 3.2 10*3/uL (ref 1.7–7.7)
Neutrophils Relative %: 55 %
Platelets: 171 10*3/uL (ref 150–400)
RBC: 2.17 MIL/uL — ABNORMAL LOW (ref 3.87–5.11)
RDW: 16.5 % — ABNORMAL HIGH (ref 11.5–15.5)
WBC: 5.8 10*3/uL (ref 4.0–10.5)

## 2015-11-27 LAB — ABO/RH: ABO/RH(D): O POS

## 2015-11-27 LAB — BASIC METABOLIC PANEL
Anion gap: 6 (ref 5–15)
BUN: 19 mg/dL (ref 6–20)
CO2: 23 mmol/L (ref 22–32)
Calcium: 8.8 mg/dL — ABNORMAL LOW (ref 8.9–10.3)
Chloride: 111 mmol/L (ref 101–111)
Creatinine, Ser: 0.77 mg/dL (ref 0.44–1.00)
GFR calc Af Amer: >60 mL/min (ref >60)
GFR calc non Af Amer: >60 mL/min (ref >60)
Glucose, Bld: 101 mg/dL — ABNORMAL HIGH (ref 65–99)
Potassium: 4 mmol/L (ref 3.5–5.1)
Sodium: 140 mmol/L (ref 135–145)

## 2015-11-27 LAB — CBC
HCT: 26.1 % — ABNORMAL LOW (ref 36.0–46.0)
Hemoglobin: 8.8 g/dL — ABNORMAL LOW (ref 12.0–15.0)
MCH: 31.4 pg (ref 26.0–34.0)
MCHC: 33.7 g/dL (ref 30.0–36.0)
MCV: 93.2 fL (ref 78.0–100.0)
Platelets: 153 10*3/uL (ref 150–400)
RBC: 2.8 MIL/uL — ABNORMAL LOW (ref 3.87–5.11)
RDW: 17.1 % — ABNORMAL HIGH (ref 11.5–15.5)
WBC: 5.7 10*3/uL (ref 4.0–10.5)

## 2015-11-27 LAB — POC OCCULT BLOOD, ED: Fecal Occult Bld: NEGATIVE

## 2015-11-27 MED ORDER — IRBESARTAN 300 MG PO TABS: 150.0000 mg | ORAL_TABLET | Freq: Every day | ORAL | Status: DC

## 2015-11-27 MED ORDER — CLONAZEPAM 0.5 MG PO TABS
0.5000 mg | ORAL_TABLET | Freq: Two times a day (BID) | ORAL | Status: DC | PRN
  Administered 2015-11-27 – 2015-11-28 (×2): 0.5 mg via ORAL
  Filled 2015-11-27 (×2): qty 1

## 2015-11-27 MED ORDER — PANTOPRAZOLE SODIUM 40 MG PO TBEC
40.0000 mg | DELAYED_RELEASE_TABLET | Freq: Every day | ORAL | Status: DC
  Administered 2015-11-28 – 2015-11-30 (×3): 40 mg via ORAL
  Filled 2015-11-27 (×3): qty 1

## 2015-11-27 MED ORDER — ONDANSETRON HCL 4 MG PO TABS: 4.0000 mg | ORAL_TABLET | Freq: Four times a day (QID) | ORAL | Status: DC | PRN

## 2015-11-27 MED ORDER — IRBESARTAN 300 MG PO TABS
150.0000 mg | ORAL_TABLET | Freq: Every day | ORAL | Status: DC
Start: 2015-11-27 — End: 2015-11-30
  Administered 2015-11-27 – 2015-11-28 (×2): 150 mg via ORAL
  Filled 2015-11-27 (×4): qty 1

## 2015-11-27 MED ORDER — POLYVINYL ALCOHOL 1.4 % OP SOLN
1.0000 [drp] | Freq: Four times a day (QID) | OPHTHALMIC | Status: DC
  Administered 2015-11-27 – 2015-11-30 (×10): 1 [drp] via OPHTHALMIC
  Filled 2015-11-27: qty 15

## 2015-11-27 MED ORDER — POLYETHYL GLYCOL-PROPYL GLYCOL 0.4-0.3 % OP SOLN: 1.0000 [drp] | Freq: Four times a day (QID) | OPHTHALMIC | Status: DC

## 2015-11-27 MED ORDER — KETOROLAC TROMETHAMINE 0.5 % OP SOLN
1.0000 [drp] | Freq: Four times a day (QID) | OPHTHALMIC | Status: DC
  Administered 2015-11-27 – 2015-11-30 (×10): 1 [drp] via OPHTHALMIC
  Filled 2015-11-27: qty 3

## 2015-11-27 MED ORDER — AMLODIPINE BESYLATE 10 MG PO TABS
10.0000 mg | ORAL_TABLET | Freq: Every day | ORAL | Status: DC
  Administered 2015-11-27 – 2015-11-28 (×2): 10 mg via ORAL
  Filled 2015-11-27 (×4): qty 1

## 2015-11-27 MED ORDER — SODIUM CHLORIDE 0.9 % IV SOLN
INTRAVENOUS | Status: AC
  Administered 2015-11-27: 21:00:00 via INTRAVENOUS

## 2015-11-27 MED ORDER — POLYETHYLENE GLYCOL 3350 17 G PO PACK: 17.0000 g | PACK | Freq: Every day | ORAL | Status: DC | PRN

## 2015-11-27 MED ORDER — ACEBUTOLOL HCL 200 MG PO CAPS
200.0000 mg | ORAL_CAPSULE | Freq: Every evening | ORAL | Status: DC
  Filled 2015-11-27: qty 1

## 2015-11-27 MED ORDER — POLYETHYLENE GLYCOL 3350 17 G PO PACK: 17.0000 g | PACK | Freq: Four times a day (QID) | ORAL | Status: DC | PRN

## 2015-11-27 MED ORDER — AMLODIPINE-OLMESARTAN 10-20 MG PO TABS: 1.0000 | ORAL_TABLET | Freq: Every day | ORAL | Status: DC

## 2015-11-27 MED ORDER — BISACODYL 5 MG PO TBEC: 5.0000 mg | DELAYED_RELEASE_TABLET | Freq: Every day | ORAL | Status: DC | PRN

## 2015-11-27 MED ORDER — SODIUM CHLORIDE 0.9 % IV SOLN
80.0000 mg | Freq: Every day | INTRAVENOUS | Status: DC
  Filled 2015-11-27: qty 80

## 2015-11-27 MED ORDER — PANTOPRAZOLE SODIUM 40 MG IV SOLR
8.0000 mg/h | INTRAVENOUS | Status: DC
  Filled 2015-11-27 (×2): qty 80

## 2015-11-27 MED ORDER — TRAZODONE HCL 50 MG PO TABS
25.0000 mg | ORAL_TABLET | Freq: Every evening | ORAL | Status: DC | PRN
  Filled 2015-11-27: qty 1

## 2015-11-27 MED ORDER — SODIUM CHLORIDE 0.9 % IV SOLN
Freq: Once | INTRAVENOUS | Status: AC
  Administered 2015-11-27: 13:00:00 via INTRAVENOUS

## 2015-11-27 MED ORDER — ACETAMINOPHEN 650 MG RE SUPP: 650.0000 mg | Freq: Four times a day (QID) | RECTAL | Status: DC | PRN

## 2015-11-27 MED ORDER — MECLIZINE HCL 25 MG PO TABS
25.0000 mg | ORAL_TABLET | Freq: Three times a day (TID) | ORAL | Status: DC | PRN
  Filled 2015-11-27: qty 1

## 2015-11-27 MED ORDER — SODIUM CHLORIDE 0.9 % IV SOLN
INTRAVENOUS | Status: AC
  Administered 2015-11-27: 18:00:00 via INTRAVENOUS

## 2015-11-27 MED ORDER — ACETAMINOPHEN 325 MG PO TABS: 650.0000 mg | ORAL_TABLET | Freq: Four times a day (QID) | ORAL | Status: DC | PRN

## 2015-11-27 MED ORDER — ONDANSETRON HCL 4 MG/2ML IJ SOLN
4.0000 mg | Freq: Four times a day (QID) | INTRAMUSCULAR | Status: DC | PRN
Start: 2015-11-27 — End: 2015-11-30

## 2015-11-27 MED ORDER — AMLODIPINE BESYLATE 10 MG PO TABS: 10.0000 mg | ORAL_TABLET | Freq: Every day | ORAL | Status: DC

## 2015-11-27 MED ORDER — PREDNISOLONE ACETATE 1 % OP SUSP
1.0000 [drp] | Freq: Four times a day (QID) | OPHTHALMIC | Status: DC
  Administered 2015-11-27 – 2015-11-30 (×10): 1 [drp] via OPHTHALMIC
  Filled 2015-11-27: qty 1

## 2015-11-27 NOTE — ED Notes (Signed)
Pt reports about 1 week hx of dark tarry stools, denies abdominal pain. States takes daily 81mg  asa. VSS.

## 2015-11-27 NOTE — Telephone Encounter (Signed)
appt has been made for 11/28/15 with Aundra Millet with Dr osei-Bonsu will call the pt with appt time. Records given to [facility]

## 2015-11-27 NOTE — Telephone Encounter (Signed)
Records on my desk

## 2015-11-27 NOTE — Telephone Encounter (Signed)
Left message for [name] to return call to discuss pt

## 2015-11-27 NOTE — H&P (Signed)
History and Physical    [name] [id number] DOB: September 24, 1935 DOA: 11/27/2015  PCP: Benito Mccreedy, MD  Patient coming from:  home    Chief Complaint:  dark stool, shortness of breath and weakness.   HPI: [name] is a 80 y.o. female with medical history significant for but not necessarily limited to,  hypertension, thyroid disease, and anxiety. Patient presented to the emergency department this a.m. with a 1.5 week history of dark tarry stools. No nausea  or abdominal pain. Stools are firm, no more than 2 Bms a day. No history of peptic ulcer disease. Patient takes a daily baby aspirin, very occas Motrin (none recently) and no other NSAIDs. No Pepto-Bismol. Hemoglobin 10/25/15 was 12. 2, down to 7.1 today. BUN is normal, interestingly she is Hemoccult-negative today. Last week patient has been short of breath with even mild exertion. She has been extremely weak.   ED Course:  Hemodynamically stable  2 units of packed red blood cells ordered, first unit in progress now Hemoglobin 7.1, normal BUN. White count normal. Platelets normal  Review of Systems: As per HPI, otherwise 10 point review of systems negative.   Past Medical History  Diagnosis Date  . Palpitations     takes Sectral nightly  . Macular degeneration (senile) of retina, unspecified   . Nontoxic uninodular goiter   . Diffuse cystic mastopathy   . Hyperlipidemia   . History of colonoscopy   . AAA (abdominal aortic aneurysm) ([place])   . Pre-diabetes   . HTN (hypertension)     takes Azor daily   . Anxiety     takes Klonopin daily  as needed  . Vertigo     takes Meclizine daily as needed    Past Surgical History  Procedure Laterality Date  . Total knee arthroplasty    . Cataract extraction w/phaco Right 04/03/2015    Procedure: CATARACT EXTRACTION PHACO AND INTRAOCULAR LENS PLACEMENT (IOC);  Surgeon: Marylynn Pearson, MD;  Location: [facility];  Service: Ophthalmology;  Laterality: Right;   Social History    Social History  . Marital Status: Divorced    Spouse Name: N/A  . Number of Children: 2  . Years of Education: 12   Occupational History  . Not on file.   Social History Main Topics  . Smoking status: Never Smoker   . Smokeless tobacco: Never Used  . Alcohol Use: No  . Drug Use: No  . Sexual Activity: No   Other Topics Concern  . Not on file   Social History Narrative   Lives with daughter   Caffeine use: none   Regular Exercise -  NO         No assistive device as needed for ambulation   Allergies  Allergen Reactions  . Statins Other (See Comments)    Nose bleeds, muscle aches  . Prednisone     Oral tablet: Per pt: made her heart race  . Ibuprofen Other (See Comments)    Blurry vision    Family History  Problem Relation Age of Onset  . Colon cancer Neg Hx   . Cancer Neg Hx   . Heart disease Neg Hx   . Stroke Neg Hx   . Neuropathy Neg Hx   . Multiple sclerosis Neg Hx   . Migraines Neg Hx   . Coronary artery disease Other     Prior to Admission medications   Medication Sig Start Date End Date Taking? Authorizing Provider  acebutolol (SECTRAL) 200 MG  capsule Take 200 mg by mouth every evening. 01/30/15  Yes Historical Provider, MD  AZOR 10-20 MG tablet Take 1 tablet by mouth daily. 03/04/15  Yes Historical Provider, MD  cholecalciferol (VITAMIN D) 1000 units tablet Take 1,000 Units by mouth daily.   Yes Historical Provider, MD  clonazePAM (KLONOPIN) 0.5 MG tablet TAKE 1 TABLET BY MOUTH TWICE A DAY AS NEEDED FOR ANXIETY 04/25/12  Yes Janith Lima, MD  ketorolac (ACULAR) 0.4 % SOLN Place 1 drop into the right eye 4 (four) times daily. 08/27/15  Yes Historical Provider, MD  meclizine (ANTIVERT) 25 MG tablet TAKE 1 TABLET BY MOUTH 3 TIMES A DAY AS NEEDED VERTIGO 09/28/11  Yes Janith Lima, MD  Multiple Vitamins-Minerals (MULTIVITAMIN,TX-MINERALS) tablet Take 1 tablet by mouth 2 (two) times daily.    Yes Historical Provider, MD  Polyethyl Glycol-Propyl Glycol  (SYSTANE) 0.4-0.3 % SOLN Place 1 drop into the left eye 4 (four) times daily.   Yes Historical Provider, MD  prednisoLONE acetate (PRED FORTE) 1 % ophthalmic suspension Place 1 drop into the right eye 4 (four) times daily. 08/27/15  Yes Historical Provider, MD  aspirin EC 81 MG tablet Take 81 mg by mouth daily.     Historical Provider, MD  CVS D3 5000 UNITS capsule Take 5,000 Units by mouth once a week.  03/20/15   Historical Provider, MD  predniSONE (DELTASONE) 20 MG tablet Take 10 mg by mouth 2 (two) times daily.    Historical Provider, MD  traMADol (ULTRAM) 50 MG tablet Take 1 tablet (50 mg total) by mouth every 6 (six) hours as needed (severe headaches). 09/08/15   Varney Biles, MD    Physical Exam: Filed Vitals:   11/27/15 0959 11/27/15 1015 11/27/15 1229  BP: 103/62 132/78 122/68  Pulse: 66 71 69  Temp: 98 F (36.7 C)  98.3 F (36.8 C)  TempSrc: Oral  Oral  Resp: 16 19 22   SpO2: 98% 98% 97%    Constitutional:  Pleasant, well-developed black female in NAD, calm, comfortable Filed Vitals:   11/27/15 0959 11/27/15 1015 11/27/15 1229  BP: 103/62 132/78 122/68  Pulse: 66 71 69  Temp: 98 F (36.7 C)  98.3 F (36.8 C)  TempSrc: Oral  Oral  Resp: 16 19 22   SpO2: 98% 98% 97%   Eyes: PER, lids and conjunctivae normal ENMT: Mucous membranes are moist. Posterior pharynx clear of any exudate or lesions.Normal dentition.  Neck: normal, supple, no masses Respiratory: clear to auscultation bilaterally, no wheezing, no crackles. Normal respiratory effort. No accessory muscle use.  Cardiovascular: Regular rate and rhythm, no murmurs / rubs / gallops. No extremity edema. 2+ pedal pulses. No carotid bruits.  Abdomen: no tenderness, no masses palpated. No hComputer little slow epatomegaly. Bowel sounds positive.  Musculoskeletal: no clubbing / cyanosis. No joint deformity upper and lower extremities. Good ROM, no contractures. Normal muscle tone.  Skin: no rashes, lesions, ulcers.    Neurologic: CN 2-12 grossly intact. Sensation intact, Strength 5/5 in all 4.  Psychiatric: Normal judgment and insight. Alert and oriented x 3. Normal mood.   Labs on Admission: I have personally reviewed following labs and imaging studies  Assessment/Plan   Active Problems:   Essential hypertension, benign   Anxiety   Rectal bleed   Gastrointestinal bleeding      Weakness / dyspnea in setting of black stools x 1.5 weeks, acute anemia. Interestingly BUN is normal and she is Hemoccult-negative today. Patient obviously had recent bleed and now with  symptomatic anemia. Took 5 days of Prednisone a month ago and is on daily ASA increasing risk for PUD.    -Place in Observation -[facility] consulted -2 units of packed red blood cells in progress -PPI bolus plus continuous infusion -Keep NPO until seen by GI -2 hour CBC following last unit of blood then Q6 CBC x 4.  -Hold home ASA for now  Anemia of acute blood loss -EDP has ordered 2 units of PRBC -Follow up CBC ordered     Hypertension. Controlled -Continue home Azor and Sectral.  Anxiety. Stable -Continue home Klonipin as needed  Constipation, recently started on Linzess which she stopped when black stools started.    DVT prophylaxis:  SCDs  Code Status:   Full code   Family Communication:  None Disposition Plan:  Discharge home 24-48 hours              Consults called:    [facility]  Admission status:  Observation - Medical bed / telemetry  Admission -  Medical bed  Telemetry    Tye Savoy NP [facility] Pager [phone]  If 7PM-7AM, please contact night-coverage [email or web address] Password [email or web address]  11/27/2015, 12:54 PM

## 2015-11-27 NOTE — Consult Note (Signed)
[facility] Consult: 5:03 PM 11/27/2015     Referring Provider: triad hospitalist Dr Marily Memos Primary Care Physician:  Benito Mccreedy, MD Primary Gastroenterologist:  Dr. Ardis Hughs.      Reason for Consultation:  Anemia.     IMPRESSION:   *  Normocytic anemia.  Hgb drop of near 5 gms in last 5 months.   Black stools x 10 days in setting of recently added Linzess and Meloxicam but she is FOBT negative on 6/6 and 6/7.   Currently transfusing with first of 2 PRBCS.    *  Rectal mass in 2011, resected.  Pathology: hemorrhoid.   *  4.9 cm Thoracic aortic aneurysm on CT 10/07/15.     PLAN:     *  Per Dr Carlean Purl.    *  CBC q 8.  discontinued the orders for Protonix drip.  With FOBT negative stool 3 times in the last 2 weeks, oral PPI can be used empirically.   *  ? reimage abdomen to assess the aneurysm?  - see below    Silvano Rusk  11/27/2015, 5:03 PM Pager: [place] Attending   I have taken an interval history, reviewed the chart and examined the patient. I agree with the Advanced Practitioner's note, impression and recommendations.   Black stools, new severe symptomatic anemia and heme negative x 3 (2 documnted here she says 3 x). LP 6 weeks ago, meloxicam x 2 weeks and recent Linzess. No pepto bismol  Everything sounds like GI bleed except heme negative and BUN also not elevated. She tells me she has low grade low back pain - not tender but deeper. No flank hemorrhages or hematomas. Some pain into posterior right thigh.  Will do CT abd/pelvis w/o contrast ? Retroperitoneal hematoma.  If that is unrevealing would pursue an EGD I suppose.  Note I corrected Hx - she has a thoracic aortic aneurysm nou abdominal.  Gatha Mayer, MD, [facility] [phone]  (pager) [phone] after 5 PM, weekends and holidays  11/27/2015 5:08 PM      HPI: [name] is a 80 y.o. female.  PMH: HLD.  Hypothyroidism.  Anxiety.  Non-toxic goiter.   Given Prednisone taper over several days late 08/2015 for possible optic neuritis.  10/07/15 lumbar puncture to assess for possible optic neuritis 10/07/15 CT angio chest, abd, pelvis to assess asymmetric thyroid :4.9 cm AAA; patent celiac, mesenteric and renal arteries.     04/2010 Colonoscopy (Dr Ardis Hughs) for follow up of 06/2007 colonoscopy with finding of rectal mass (Dr Penelope Coop): 1.5 cm anal nodule.  S/p transanal excision of the rectal mass 07/2010.  Pathology: hemorrhoid.     ~ 2 weeks ago started on Linzess, started having 1 to 2 BMs per day, they were dark but not bloody or melenic.  Texture varied from soft to formed.  ~ 10 days ago, after the dark stools had started, she began taking Meloxicam.  Over last 10 to 14 days has had progressive weakness, fatigue, DOE, dizziness.  No abdominal pain.  No  n/v.  Some appetite decline. No weight loss.  11/14/15 visit to ED with c/o black stool. Was FOBT negative in ED.  Hgb 12.5.   Went to PMD 6/5: FOBT negative, told to stop Meloxicam, CBC drawn.  PMD called her today and said blood counts low, go to ED.   FOBT negative again today.  Hgb 7.1.  MCV 97.  BUN is not elevated. No CKD.  No NSAIDs  Initial BP soft at 103/62.  Pulses in 60s - 70s.    No nose bleeds.  Takes ASA 81 daily.  No ETOH. No peripheral tingling, numbness or loss of limb function.  No chest pain or cough.  Eyesight fair, previous inflammation has resolved.  Generally active, retired [employer].  Busy volunteer at her church.      Past Medical History  Diagnosis Date  . Palpitations     takes Sectral nightly  . Macular degeneration (senile) of retina, unspecified   . Nontoxic uninodular goiter   . Diffuse cystic mastopathy   . Hyperlipidemia   . History of colonoscopy   . AAA (abdominal aortic aneurysm)  ([place])   . Pre-diabetes   . HTN (hypertension)     takes Azor daily   . Anxiety     takes Klonopin daily  as needed  . Vertigo     takes Meclizine daily as needed  . Headache   . GERD (gastroesophageal reflux disease)     Past Surgical History  Procedure Laterality Date  . Total knee arthroplasty    . Cataract extraction w/phaco Right 04/03/2015    Procedure: CATARACT EXTRACTION PHACO AND INTRAOCULAR LENS PLACEMENT (IOC);  Surgeon: Marylynn Pearson, MD;  Location: [facility];  Service: Ophthalmology;  Laterality: Right;  . Transanal excision of rectal mass  07/2010    pathology: hemorrhoid.     Prior to Admission medications   Medication Sig Start Date End Date Taking? Authorizing Provider  acebutolol (SECTRAL) 200 MG capsule Take 200 mg by mouth every evening. 01/30/15  Yes Historical Provider, MD  AZOR 10-20 MG tablet Take 1 tablet by mouth daily. 03/04/15  Yes Historical Provider, MD  cholecalciferol (VITAMIN D) 1000 units tablet Take 1,000 Units by mouth daily.   Yes Historical Provider, MD  clonazePAM (KLONOPIN) 0.5 MG tablet TAKE 1 TABLET BY MOUTH TWICE A DAY AS NEEDED FOR ANXIETY 04/25/12  Yes Janith Lima, MD  ketorolac (ACULAR) 0.4 % SOLN Place 1 drop into the right eye 4 (four) times daily. 08/27/15  Yes Historical Provider, MD  meclizine (ANTIVERT) 25 MG tablet TAKE 1 TABLET BY MOUTH 3 TIMES A DAY AS NEEDED VERTIGO 09/28/11  Yes Janith Lima, MD  Multiple Vitamins-Minerals (MULTIVITAMIN,TX-MINERALS) tablet Take 1 tablet by mouth 2 (two) times daily.    Yes Historical Provider, MD  Polyethyl Glycol-Propyl Glycol (SYSTANE) 0.4-0.3 % SOLN Place 1 drop into the left eye 4 (four) times daily.   Yes Historical Provider, MD  prednisoLONE acetate (PRED FORTE) 1 % ophthalmic suspension Place 1 drop into the right eye 4 (four) times daily. 08/27/15  Yes Historical Provider, MD  aspirin EC 81 MG tablet Take 81 mg by mouth daily.     Historical Provider, MD  CVS D3 5000 UNITS capsule Take 5,000 Units  by mouth once a week.  03/20/15   Historical Provider, MD  predniSONE (DELTASONE) 20 MG tablet Take 10 mg by mouth 2 (two) times daily.    Historical Provider, MD  traMADol (ULTRAM) 50 MG tablet Take 1  tablet (50 mg total) by mouth every 6 (six) hours as needed (severe headaches). 09/08/15   Varney Biles, MD    Scheduled Meds: . acebutolol  200 mg Oral QPM  . amlodipine-olmesartan  1 tablet Oral Daily  . ketorolac  1 drop Right Eye QID  . [START ON 11/28/2015] pantoprazole  40 mg Oral Q0600  . Polyethyl Glycol-Propyl Glycol  1 drop Left Eye QID  . prednisoLONE acetate  1 drop Right Eye QID   Infusions: . sodium chloride    . sodium chloride     PRN Meds: acetaminophen **OR** acetaminophen, bisacodyl, clonazePAM, meclizine, ondansetron **OR** ondansetron (ZOFRAN) IV, polyethylene glycol, traZODone   Allergies as of 11/27/2015 - Review Complete 11/27/2015  Allergen Reaction Noted  . Statins Other (See Comments) 08/10/2011  . Prednisone  08/28/2015  . Ibuprofen Other (See Comments) 09/12/2008    Family History  Problem Relation Age of Onset  . Colon cancer Neg Hx   . Cancer Neg Hx   . Heart disease Neg Hx   . Stroke Neg Hx   . Neuropathy Neg Hx   . Multiple sclerosis Neg Hx   . Migraines Neg Hx   . Coronary artery disease Other     Social History   Social History  . Marital Status: Divorced    Spouse Name: N/A  . Number of Children: 2  . Years of Education: 12   Occupational History  . Not on file.   Social History Main Topics  . Smoking status: Never Smoker   . Smokeless tobacco: Never Used  . Alcohol Use: No  . Drug Use: No  . Sexual Activity: No   Other Topics Concern  . Not on file   Social History Narrative   Lives with daughter   Caffeine use: none   Regular Exercise -  NO          REVIEW OF SYSTEMS: Constitutional:  Weight stable.   ENT:  Epistaxis on one occasion 3 to 4 months ago.  Pulm:  No SOB or cough CV:  No palpitations, no LE edema.    GU:  No hematuria, no frequency GI:  Per HPI.  No dysphagia.  No hx ulcers Heme:  No previous issue with anemia.     Transfusions:  No previous transfusion Neuro:  No headaches, no peripheral tingling or numbness Derm:  No itching, no rash or sores.  Endocrine:  No sweats or chills.  No polyuria or dysuria Immunization:  Not queried.  Travel:  None beyond local counties in last few months.    PHYSICAL EXAM: Vital signs in last 24 hours: Filed Vitals:   11/27/15 1625 11/27/15 1647  BP: 125/79 128/70  Pulse: 67 70  Temp:  98.2 F (36.8 C)  Resp: 20 18   Wt Readings from Last 3 Encounters:  09/08/15 164 lb (74.39 kg)  08/28/15 165 lb 12.8 oz (75.206 kg)  04/03/15 170 lb (77.111 kg)    General: pleasant, comfortable, looks well Head:  No asymmetry, no swelling.    Eyes:  No pallor or icterus.   Ears:  Not HOH  Nose:  No discharge.   Mouth:  Moist, clear.  No lesions Neck:  No mass, no JVD.  No TMG.   Lungs:  Clear bil.  No dyspnea, no cough.   Heart: RRR.  No MRG.   Abdomen:  Soft, NT, ND.  No mass.  No HSM.  No hernias or bruits.  .   Rectal: FOBT negative  per ED MD exam.    Musc/Skeltl: no joint swelling, gross deformity or redness Extremities:  No CCE  Neurologic:  Alert, oriented.  Moving all 4 limbs.  No tremor. Skin:  No rash, sores or telangectasia Tattoos:  none   Psych:  Pleasant, somewhat anxious.  Appropriate, cooperative. Good historian.      Intake/Output this shift: Total I/O In: 345 [I.V.:10; Blood:335] Out: 400 [Urine:400]  LAB RESULTS:  Recent Labs  11/27/15 1030  WBC 5.8  HGB 7.1*  HCT 21.1*  PLT 171   BMET Lab Results  Component Value Date   NA 140 11/27/2015   NA 139 11/14/2015   NA 139 09/08/2015   K 4.0 11/27/2015   K 4.5 11/14/2015   K 4.4 09/08/2015   CL 111 11/27/2015   CL 103 11/14/2015   CL 105 09/08/2015   CO2 23 11/27/2015   CO2 24 09/08/2015   CO2 25 08/28/2015   GLUCOSE 101* 11/27/2015   GLUCOSE 104* 11/14/2015    GLUCOSE 134* 09/08/2015   BUN 19 11/27/2015   BUN 26* 11/14/2015   BUN 21* 09/08/2015   CREATININE 0.77 11/27/2015   CREATININE 0.90 11/14/2015   CREATININE 0.78 09/08/2015   CALCIUM 8.8* 11/27/2015   CALCIUM 9.4 09/08/2015   CALCIUM 9.3 08/28/2015   LFT No results for input(s): PROT, ALBUMIN, AST, ALT, ALKPHOS, BILITOT, BILIDIR, IBILI in the last 72 hours. PT/INR Lab Results  Component Value Date   INR 1.01 07/04/2011   INR 1.0 ratio 05/27/2009   Lipase     Component Value Date/Time   LIPASE 33 09/12/2010 1959     ENDOSCOPIC STUDIES: Colonoscopy per HPI

## 2015-11-27 NOTE — ED Notes (Signed)
RN at bedside administering blood

## 2015-11-27 NOTE — ED Provider Notes (Signed)
CSN: [id number]     Arrival date & time 11/27/15  [id number] History   First MD Initiated Contact with Patient 11/27/15 [phone]     Chief Complaint  Patient presents with  . Rectal Bleeding     (Consider location/radiation/quality/duration/timing/severity/associated sxs/prior Treatment) HPI   [name] is a 80 y.o. female who presents for evaluation of dark tarry stools, low hemoglobin, and dyspnea on exertion. Onset dark stools 2 weeks ago. She was evaluated at an urgent care on 11/14/2014 and discharged to follow-up with her PCP. She saw her PCP yesterday. She states that she took a stool sample, but it was "too old". Apparently her blood was drawn and it returned with a hemoglobin of 7, so she was instructed to come here. No ongoing chest pain, anorexia, nausea, vomiting, headache or back pain. She presents by private vehicle. She states that if she needs a blood transfusion, she would like to get it from a family member. There are no other known modifying factors.   Past Medical History  Diagnosis Date  . Palpitations     takes Sectral nightly  . Macular degeneration (senile) of retina, unspecified   . Nontoxic uninodular goiter   . Diffuse cystic mastopathy   . Hyperlipidemia   . History of colonoscopy   . AAA (abdominal aortic aneurysm) ([place])   . Pre-diabetes   . HTN (hypertension)     takes Azor daily   . Anxiety     takes Klonopin daily  as needed  . Vertigo     takes Meclizine daily as needed  . Headache    Past Surgical History  Procedure Laterality Date  . Total knee arthroplasty    . Cataract extraction w/phaco Right 04/03/2015    Procedure: CATARACT EXTRACTION PHACO AND INTRAOCULAR LENS PLACEMENT (IOC);  Surgeon: Marylynn Pearson, MD;  Location: [facility];  Service: Ophthalmology;  Laterality: Right;   Family History  Problem Relation Age of Onset  . Colon cancer Neg Hx   . Cancer Neg Hx   . Heart disease Neg Hx   . Stroke Neg Hx   . Neuropathy Neg Hx   . Multiple  sclerosis Neg Hx   . Migraines Neg Hx   . Coronary artery disease Other    Social History  Substance Use Topics  . Smoking status: Never Smoker   . Smokeless tobacco: Never Used  . Alcohol Use: No   OB History    No data available     Review of Systems  All other systems reviewed and are negative.     Allergies  Statins; Prednisone; and Ibuprofen  Home Medications   Prior to Admission medications   Medication Sig Start Date End Date Taking? Authorizing Provider  acebutolol (SECTRAL) 200 MG capsule Take 200 mg by mouth every evening. 01/30/15  Yes Historical Provider, MD  AZOR 10-20 MG tablet Take 1 tablet by mouth daily. 03/04/15  Yes Historical Provider, MD  cholecalciferol (VITAMIN D) 1000 units tablet Take 1,000 Units by mouth daily.   Yes Historical Provider, MD  clonazePAM (KLONOPIN) 0.5 MG tablet TAKE 1 TABLET BY MOUTH TWICE A DAY AS NEEDED FOR ANXIETY 04/25/12  Yes Janith Lima, MD  ketorolac (ACULAR) 0.4 % SOLN Place 1 drop into the right eye 4 (four) times daily. 08/27/15  Yes Historical Provider, MD  meclizine (ANTIVERT) 25 MG tablet TAKE 1 TABLET BY MOUTH 3 TIMES A DAY AS NEEDED VERTIGO 09/28/11  Yes Janith Lima, MD  Multiple Vitamins-Minerals (  MULTIVITAMIN,TX-MINERALS) tablet Take 1 tablet by mouth 2 (two) times daily.    Yes Historical Provider, MD  Polyethyl Glycol-Propyl Glycol (SYSTANE) 0.4-0.3 % SOLN Place 1 drop into the left eye 4 (four) times daily.   Yes Historical Provider, MD  prednisoLONE acetate (PRED FORTE) 1 % ophthalmic suspension Place 1 drop into the right eye 4 (four) times daily. 08/27/15  Yes Historical Provider, MD  aspirin EC 81 MG tablet Take 81 mg by mouth daily.     Historical Provider, MD  CVS D3 5000 UNITS capsule Take 5,000 Units by mouth once a week.  03/20/15   Historical Provider, MD  predniSONE (DELTASONE) 20 MG tablet Take 10 mg by mouth 2 (two) times daily.    Historical Provider, MD  traMADol (ULTRAM) 50 MG tablet Take 1 tablet (50 mg  total) by mouth every 6 (six) hours as needed (severe headaches). 09/08/15   Ankit Nanavati, MD   BP 132/78 mmHg  Pulse 71  Temp(Src) 98 F (36.7 C) (Oral)  Resp 19  SpO2 98% Physical Exam  Constitutional: She is oriented to person, place, and time. She appears well-developed. No distress.  Elderly, frail  HENT:  Head: Normocephalic and atraumatic.  Right Ear: External ear normal.  Left Ear: External ear normal.  Eyes: Conjunctivae and EOM are normal. Pupils are equal, round, and reactive to light.  Neck: Normal range of motion and phonation normal. Neck supple.  Cardiovascular: Normal rate, regular rhythm and normal heart sounds.   Pulmonary/Chest: Effort normal and breath sounds normal. She exhibits no bony tenderness.  Abdominal: Soft. There is no tenderness.  Musculoskeletal: Normal range of motion.  Neurological: She is alert and oriented to person, place, and time. No cranial nerve deficit or sensory deficit. She exhibits normal muscle tone. Coordination normal.  Skin: Skin is warm, dry and intact.  Psychiatric: She has a normal mood and affect. Her behavior is normal. Judgment and thought content normal.  Nursing note and vitals reviewed.   ED Course  Procedures (including critical care time)  Medications  0.9 %  sodium chloride infusion (not administered)    Patient Vitals for the past 24 hrs:  BP Temp Temp src Pulse Resp SpO2  11/27/15 1015 132/78 mmHg - - 71 19 98 %  11/27/15 0959 103/62 mmHg 98 F (36.7 C) Oral 66 16 98 %    12:04 PM Reevaluation with update and discussion. After initial assessment and treatment, an updated evaluation reveals No additional complaints. She is able to ambulate easily. At this time. She agrees to blood transfusion. Findings discussed with the patient, all questions answered. Turhan Chill L   12:06 PM-Consult complete with Hospitalist. Patient case explained and discussed. She  agrees to admit patient for further evaluation and  treatment. Call ended at 12:15   Labs Review Labs Reviewed  CBC WITH DIFFERENTIAL/PLATELET - Abnormal; Notable for the following:    RBC 2.17 (*)    Hemoglobin 7.1 (*)    HCT 21.1 (*)    RDW 16.5 (*)    All other components within normal limits  BASIC METABOLIC PANEL - Abnormal; Notable for the following:    Glucose, Bld 101 (*)    Calcium 8.8 (*)    All other components within normal limits  POC OCCULT BLOOD, ED  TYPE AND SCREEN  ABO/RH  PREPARE RBC (CROSSMATCH)    Imaging Review No results found. I have personally reviewed and evaluated these images and lab results as part of my medical decision-making.  EKG Interpretation None      MDM   Final diagnoses:  Iron deficiency anemia due to chronic blood loss  Gastrointestinal hemorrhage, unspecified gastritis, unspecified gastrointestinal hemorrhage type    Symptomatic anemia with likely gastrointestinal bleeding, as the source. She has dark stool, for greater than 2 weeks. Is asymptomatic. Suspect upper GI bleed. Hemoglobin has decreased about 5 g in the last 2 weeks. Blood transfusion ordered.  Nursing Notes Reviewed/ Care Coordinated, and agree without changes. Applicable Imaging Reviewed.  Interpretation of Laboratory Data incorporated into ED treatment  Plan: Admit    Daleen Bo, MD 11/27/15 1220

## 2015-11-27 NOTE — ED Notes (Signed)
Pt and family made aware of bed assignment 

## 2015-11-28 ENCOUNTER — Observation Stay (HOSPITAL_COMMUNITY): Payer: Medicare Other

## 2015-11-28 ENCOUNTER — Ambulatory Visit: Payer: Medicare Other | Admitting: Gastroenterology

## 2015-11-28 ENCOUNTER — Encounter (HOSPITAL_COMMUNITY)
Admission: EM | Disposition: A | Discharge status: Home / Self Care | Payer: Self-pay | Source: Home / Self Care | Attending: Family Medicine

## 2015-11-28 ENCOUNTER — Encounter (HOSPITAL_COMMUNITY): Payer: Self-pay | Admitting: Radiology

## 2015-11-28 DIAGNOSIS — K922 Gastrointestinal hemorrhage, unspecified: Secondary | ICD-10-CM | POA: Diagnosis not present

## 2015-11-28 DIAGNOSIS — D62 Acute posthemorrhagic anemia: Secondary | ICD-10-CM | POA: Diagnosis not present

## 2015-11-28 DIAGNOSIS — F411 Generalized anxiety disorder: Secondary | ICD-10-CM

## 2015-11-28 DIAGNOSIS — F419 Anxiety disorder, unspecified: Secondary | ICD-10-CM | POA: Diagnosis not present

## 2015-11-28 DIAGNOSIS — I10 Essential (primary) hypertension: Secondary | ICD-10-CM | POA: Diagnosis not present

## 2015-11-28 DIAGNOSIS — K3182 Dieulafoy lesion (hemorrhagic) of stomach and duodenum: Secondary | ICD-10-CM

## 2015-11-28 HISTORY — PX: ESOPHAGOGASTRODUODENOSCOPY: SHX5428

## 2015-11-28 LAB — CBC
HCT: 25.8 % — ABNORMAL LOW (ref 36.0–46.0)
HCT: 28.4 % — ABNORMAL LOW (ref 36.0–46.0)
Hemoglobin: 8.6 g/dL — ABNORMAL LOW (ref 12.0–15.0)
Hemoglobin: 9.6 g/dL — ABNORMAL LOW (ref 12.0–15.0)
MCH: 30.8 pg (ref 26.0–34.0)
MCH: 31.3 pg (ref 26.0–34.0)
MCHC: 33.3 g/dL (ref 30.0–36.0)
MCHC: 33.8 g/dL (ref 30.0–36.0)
MCV: 92.5 fL (ref 78.0–100.0)
MCV: 92.5 fL (ref 78.0–100.0)
Platelets: 152 10*3/uL (ref 150–400)
Platelets: 160 10*3/uL (ref 150–400)
RBC: 2.79 MIL/uL — ABNORMAL LOW (ref 3.87–5.11)
RBC: 3.07 MIL/uL — ABNORMAL LOW (ref 3.87–5.11)
RDW: 17.7 % — ABNORMAL HIGH (ref 11.5–15.5)
RDW: 17.4 % — ABNORMAL HIGH (ref 11.5–15.5)
WBC: 5.3 10*3/uL (ref 4.0–10.5)
WBC: 7 10*3/uL (ref 4.0–10.5)

## 2015-11-28 LAB — BASIC METABOLIC PANEL
Anion gap: 5 (ref 5–15)
BUN: 13 mg/dL (ref 6–20)
CO2: 23 mmol/L (ref 22–32)
Calcium: 8.6 mg/dL — ABNORMAL LOW (ref 8.9–10.3)
Chloride: 111 mmol/L (ref 101–111)
Creatinine, Ser: 0.65 mg/dL (ref 0.44–1.00)
GFR calc Af Amer: >60 mL/min (ref >60)
GFR calc non Af Amer: >60 mL/min (ref >60)
Glucose, Bld: 90 mg/dL (ref 65–99)
Potassium: 3.8 mmol/L (ref 3.5–5.1)
Sodium: 139 mmol/L (ref 135–145)

## 2015-11-28 LAB — TYPE AND SCREEN
ABO/RH(D): O POS
Antibody Screen: NEGATIVE
Unit division: 0
Unit division: 0

## 2015-11-28 SURGERY — EGD (ESOPHAGOGASTRODUODENOSCOPY)
Anesthesia: Moderate Sedation

## 2015-11-28 MED ORDER — EPINEPHRINE HCL 0.1 MG/ML IJ SOSY
PREFILLED_SYRINGE | INTRAMUSCULAR | Status: AC
  Filled 2015-11-28: qty 10

## 2015-11-28 MED ORDER — FENTANYL CITRATE (PF) 100 MCG/2ML IJ SOLN
INTRAMUSCULAR | Status: AC
  Filled 2015-11-28: qty 2

## 2015-11-28 MED ORDER — MIDAZOLAM HCL 10 MG/2ML IJ SOLN
INTRAMUSCULAR | Status: DC | PRN
  Administered 2015-11-28 (×2): 2 mg via INTRAVENOUS
  Administered 2015-11-28: 1 mg via INTRAVENOUS
  Administered 2015-11-28: 2 mg via INTRAVENOUS
  Administered 2015-11-28: 1 mg via INTRAVENOUS

## 2015-11-28 MED ORDER — SODIUM CHLORIDE 0.9 % IJ SOLN
PREFILLED_SYRINGE | INTRAMUSCULAR | Status: DC | PRN
  Administered 2015-11-28: 6 mL

## 2015-11-28 MED ORDER — FENTANYL CITRATE (PF) 100 MCG/2ML IJ SOLN
INTRAMUSCULAR | Status: DC | PRN
  Administered 2015-11-28 (×4): 25 ug via INTRAVENOUS

## 2015-11-28 MED ORDER — SODIUM CHLORIDE 0.9 % IV SOLN: INTRAVENOUS | Status: DC

## 2015-11-28 MED ORDER — ACEBUTOLOL HCL 200 MG PO CAPS
200.0000 mg | ORAL_CAPSULE | Freq: Two times a day (BID) | ORAL | Status: DC
  Filled 2015-11-28: qty 1

## 2015-11-28 MED ORDER — MIDAZOLAM HCL 5 MG/ML IJ SOLN
INTRAMUSCULAR | Status: AC
  Filled 2015-11-28: qty 2

## 2015-11-28 MED ORDER — BUTAMBEN-TETRACAINE-BENZOCAINE 2-2-14 % EX AERO
INHALATION_SPRAY | CUTANEOUS | Status: DC | PRN
  Administered 2015-11-28: 2 via TOPICAL

## 2015-11-28 NOTE — Progress Notes (Signed)
Patient ID: [name], female   DOB: 1935-09-27, 80 y.o.   MRN: [id number]  PROGRESS NOTE    BETTIJANE EHRESMANN  [id number] DOB: January 17, 1936 DOA: 11/27/2015  PCP: Benito Mccreedy, MD   Brief Narrative:  80 y.o. female with medical history significant for hypertension, thyroid disease, anxiety. Patient presented to the emergency department with dark tarry stools for last week or so prior to the admission. No nausea or abdominal pain.  In ED, BP was 103/62, HR 76, RR 22. Oxygen saturation was 95% on room air. Blood work showed hemoglobin of 7.1. Pt has received 2 U PRBC transfusion. GI has seen the pt in consultation, recommended CT abdomen for further evaluation which did not showed evidence of bleed. Plan for EGD today.   Assessment & Plan:   Upper GI bleed / Acute blood loss anemia - Holding aspirin  - Has received 2 units PRBC transfusion - EGD today - CT abdomen with no acute intraabdominal findings - NPO for EGD - IV fluids for hydration - Continue PPI therapy   Hypertension, essential - Continue Norvasc and Avapro  Anxiety - Continue Klonipin as needed   DVT prophylaxis: SCD's bilaterally due to risk of bleeding  Code Status: full code  Family Communication: no family at the bedside this am  Disposition Plan: plan for EGD today    Consultants:   GI (Dr. Silvano Rusk)  Procedures:   None   Antimicrobials:   None    Subjective: No overnight events.   Objective: Filed Vitals:   11/27/15 1809 11/27/15 1833 11/27/15 2035 11/27/15 2118  BP: 120/63 131/72 131/70 123/69  Pulse: 62 66 65 76  Temp: 98.5 F (36.9 C) 98.2 F (36.8 C) 98 F (36.7 C)   TempSrc: Oral Oral Oral   Resp: 16 16 17    SpO2: 98% 99% 99% 96%    Intake/Output Summary (Last 24 hours) at 11/28/15 0646 Last data filed at 11/27/15 2300  Gross per 24 hour  Intake 1618.33 ml  Output    400 ml  Net 1218.33 ml   There were no vitals filed for this visit.  Examination:  General  exam: Appears calm and comfortable  Respiratory system: Clear to auscultation. Respiratory effort normal. Cardiovascular system: S1 & S2 heard, RRR. No JVD Gastrointestinal system: Abdomen is nondistended, soft and nontender. No organomegaly or masses felt. Normal bowel sounds heard. Central nervous system: Alert and oriented. No focal neurological deficits. Extremities: Symmetric 5 x 5 power. Skin: No rashes, lesions or ulcers Psychiatry: Judgement and insight appear normal. Mood & affect appropriate.   Data Reviewed: I have personally reviewed following labs and imaging studies  CBC:  Recent Labs Lab 11/27/15 1030 11/27/15 2235  WBC 5.8 5.7  NEUTROABS 3.2  --   HGB 7.1* 8.8*  HCT 21.1* 26.1*  MCV 97.2 93.2  PLT 171 0000000   Basic Metabolic Panel:  Recent Labs Lab 11/27/15 1030 11/27/15 2235  NA 140 139  K 4.0 3.8  CL 111 111  CO2 23 23  GLUCOSE 101* 90  BUN 19 13  CREATININE 0.77 0.65  CALCIUM 8.8* 8.6*   GFR: CrCl cannot be calculated (Unknown ideal weight.). Liver Function Tests: No results for input(s): AST, ALT, ALKPHOS, BILITOT, PROT, ALBUMIN in the last 168 hours. No results for input(s): LIPASE, AMYLASE in the last 168 hours. No results for input(s): AMMONIA in the last 168 hours. Coagulation Profile: No results for input(s): INR, PROTIME in the last 168 hours. Cardiac Enzymes: No  results for input(s): CKTOTAL, CKMB, CKMBINDEX, TROPONINI in the last 168 hours. BNP (last 3 results) No results for input(s): PROBNP in the last 8760 hours. HbA1C: No results for input(s): HGBA1C in the last 72 hours. CBG: No results for input(s): GLUCAP in the last 168 hours. Lipid Profile: No results for input(s): CHOL, HDL, LDLCALC, TRIG, CHOLHDL, LDLDIRECT in the last 72 hours. Thyroid Function Tests: No results for input(s): TSH, T4TOTAL, FREET4, T3FREE, THYROIDAB in the last 72 hours. Anemia Panel: No results for input(s): VITAMINB12, FOLATE, FERRITIN, TIBC, IRON,  RETICCTPCT in the last 72 hours. Urine analysis:    Component Value Date/Time   COLORURINE YELLOW 09/12/2010 1921   APPEARANCEUR CLEAR 09/12/2010 1921   LABSPEC 1.006 09/12/2010 1921   PHURINE 7.0 09/12/2010 1921   GLUCOSEU NEGATIVE 09/12/2010 1921   HGBUR NEGATIVE 09/12/2010 1921   BILIRUBINUR NEGATIVE 09/12/2010 1921   KETONESUR NEGATIVE 09/12/2010 1921   PROTEINUR NEGATIVE 09/12/2010 1921   UROBILINOGEN 0.2 09/12/2010 1921   NITRITE NEGATIVE 09/12/2010 1921   LEUKOCYTESUR  09/12/2010 1921    NEGATIVE MICROSCOPIC NOT DONE ON URINES WITH NEGATIVE PROTEIN, BLOOD, LEUKOCYTES, NITRITE, OR GLUCOSE <1000 mg/dL.   Sepsis Labs: @LABRCNTIP (procalcitonin:4,lacticidven:4)   )No results found for this or any previous visit (from the past 240 hour(s)).    Radiology Studies: Ct Abdomen Pelvis Wo Contrast 11/28/2015  No retroperitoneal or other hematoma to explain symptoms. Stable exam compared to 10/07/2015. Electronically Signed   By: Monte Fantasia M.D.   On: 11/28/2015 05:16    Scheduled Meds: . amLODipine  10 mg Oral Daily  . irbesartan  150 mg Oral Daily  . pantoprazole  40 mg Oral Q0600   Continuous Infusions: . sodium chloride 75 mL/hr at 11/27/15 2038       Time spent: 25 minutes  Greater than 50% of the time spent on counseling and coordinating the care.   Leisa Lenz, MD [facility] Pager [phone]  If 7PM-7AM, please contact night-coverage [email or web address] Password [facility] 11/28/2015, 6:46 AM

## 2015-11-28 NOTE — Care Management Obs Status (Signed)
[place] NOTIFICATION   Patient Details  Name: [name] MRN: [id number] Date of Birth: 10-Mar-1936   Medicare Observation Status Notification Given:  Yes    Carles Collet, RN 11/28/2015, 2:54 PM

## 2015-11-28 NOTE — Op Note (Signed)
[facility] Patient Name: [name] Procedure Date : 11/28/2015 MRN: [id number] Attending MD: Gatha Mayer , MD Date of Birth: 12/19/1935 CSN: [id number] Age: 80 Admit Type: Inpatient Procedure:                Upper GI endoscopy Indications:              Melena Providers:                Gatha Mayer, MD, Malka So, RN, Cletis Athens, Technician Referring MD:              Medicines:                Fentanyl 75 micrograms IV, Cetacaine spray,                            Midazolam 7 mg IV Complications:            No immediate complications. Estimated Blood Loss:     Estimated blood loss was minimal. Procedure:                Pre-Anesthesia Assessment:                           - Prior to the procedure, a History and Physical                            was performed, and patient medications and                            allergies were reviewed. The patient's tolerance of                            previous anesthesia was also reviewed. The risks                            and benefits of the procedure and the sedation                            options and risks were discussed with the patient.                            All questions were answered, and informed consent                            was obtained. Prior Anticoagulants: The patient                            last took aspirin 1 day prior to the procedure. ASA                            Grade Assessment: III - A patient with severe  systemic disease. After reviewing the risks and                            benefits, the patient was deemed in satisfactory                            condition to undergo the procedure.                           After obtaining informed consent, the endoscope was                            passed under direct vision. Throughout the                            procedure, the patient's blood pressure, pulse, and                    oxygen saturations were monitored continuously. The                            [id number] scope was introduced through the                            mouth, and advanced to the second part of duodenum.                            The [id number]) scope was introduced through                            the and advanced to the. The upper GI endoscopy was                            somewhat difficult due to patient intolerance of                            esophageal intubation. Successful completion of the                            procedure was aided by withdrawing the scope and                            replacing with the pediatric endoscope. Scope In: Scope Out: Findings:      Red blood was found on the greater curvature of the stomach.      A Dieulafoy lesion with oozing bleeding and stigmata of recent bleeding       was found on the greater curvature of the stomach. For hemostasis, three       hemostatic clips were successfully placed (MR conditional). There was no       bleeding at the end of the procedure. Area was successfully injected       with 6 mL of a 1:10,000 solution of epinephrine for hemostasis.       Estimated blood loss was minimal.      The exam was otherwise without abnormality.  Retroflexion as above Impression:               - Red blood in the greater curvature of the stomach.                           - Dieulafoy lesion of stomach.                           - The examination was otherwise normal.                           - No specimens collected. Moderate Sedation:      Moderate (conscious) sedation was administered by the endoscopy nurse       and supervised by the endoscopist. The following parameters were       monitored: oxygen saturation, heart rate, blood pressure, respiratory       rate, EKG, adequacy of pulmonary ventilation, and response to care.       Total physician intraservice time was 25 minutes. Recommendation:            - Return patient to hospital ward for ongoing care.                           - Clear liquid diet.                           - Continue present medications.                           - Observe patient's clinical course following                            today's procedure with therapeutic intervention. Procedure Code(s):        --- Professional ---                           [phone], Esophagogastroduodenoscopy, flexible,                            transoral; with control of bleeding, any method                           99153, Moderate sedation services; each additional                            15 minutes intraservice time                           G0500, Moderate sedation services provided by the                            same physician or other qualified health care                            professional performing a gastrointestinal  endoscopic service that sedation supports,                            requiring the presence of an independent trained                            observer to assist in the monitoring of the                            patient's level of consciousness and physiological                            status; initial 15 minutes of intra-service time;                            patient age 8 years or older (additional time may                            be reported with [phone], as appropriate) Diagnosis Code(s):        --- Professional ---                           K92.2, Gastrointestinal hemorrhage, unspecified                           K31.82, Dieulafoy lesion (hemorrhagic) of stomach                            and duodenum                           K92.1, Melena (includes Hematochezia) CPT copyright 2016 [employer]. All rights reserved. The codes documented in this report are preliminary and upon coder review may  be revised to meet current compliance requirements. Gatha Mayer, MD 11/28/2015 2:10:16 PM This  report has been signed electronically. Number of Addenda: 0

## 2015-11-28 NOTE — Progress Notes (Signed)
   CT was negative for retroperitoneal bleed.  Given hx black stools and anemia will do EGD today as discussed with patient and son yesterday  Gatha Mayer, MD, [facility] [phone] (pager) [phone] after 5 PM, weekends and holidays  11/28/2015 7:35 AM

## 2015-11-28 NOTE — Care Management Note (Addendum)
Case Management Note  Patient Details  Name: [name] MRN: [id number] Date of Birth: [birth year]  Subjective/Objective:                 Patient in obs for GI Bleed. Patient off unit at procedure. Will continue to follow for DC needs. No CM needs identified at this time.   Action/Plan:   Expected Discharge Date:                  Expected Discharge Plan:  Home/Self Care  In-House Referral:     Discharge planning Services  CM Consult  Post Acute Care Choice:  NA Choice offered to:     DME Arranged:    DME Agency:     HH Arranged:    HH Agency:     Status of Service:  Completed, signed off  Medicare Important Message Given:    Date Medicare IM Given:    Medicare IM give by:    Date Additional Medicare IM Given:    Additional Medicare Important Message give by:     If discussed at [place] of Stay Meetings, dates discussed:    Additional Comments:  Carles Collet, RN 11/28/2015, 2:53 PM

## 2015-11-29 ENCOUNTER — Other Ambulatory Visit: Payer: Self-pay | Admitting: Internal Medicine

## 2015-11-29 ENCOUNTER — Encounter (HOSPITAL_COMMUNITY): Payer: Self-pay | Admitting: Internal Medicine

## 2015-11-29 DIAGNOSIS — F419 Anxiety disorder, unspecified: Secondary | ICD-10-CM | POA: Diagnosis not present

## 2015-11-29 DIAGNOSIS — K3182 Dieulafoy lesion (hemorrhagic) of stomach and duodenum: Secondary | ICD-10-CM | POA: Diagnosis not present

## 2015-11-29 DIAGNOSIS — K922 Gastrointestinal hemorrhage, unspecified: Secondary | ICD-10-CM | POA: Diagnosis not present

## 2015-11-29 DIAGNOSIS — D649 Anemia, unspecified: Secondary | ICD-10-CM

## 2015-11-29 DIAGNOSIS — I10 Essential (primary) hypertension: Secondary | ICD-10-CM | POA: Diagnosis not present

## 2015-11-29 LAB — CBC
HCT: 26.2 % — ABNORMAL LOW (ref 36.0–46.0)
Hemoglobin: 8.8 g/dL — ABNORMAL LOW (ref 12.0–15.0)
MCH: 31.8 pg (ref 26.0–34.0)
MCHC: 33.6 g/dL (ref 30.0–36.0)
MCV: 94.6 fL (ref 78.0–100.0)
Platelets: 164 10*3/uL (ref 150–400)
RBC: 2.77 MIL/uL — ABNORMAL LOW (ref 3.87–5.11)
RDW: 17.5 % — ABNORMAL HIGH (ref 11.5–15.5)
WBC: 5.2 10*3/uL (ref 4.0–10.5)

## 2015-11-29 MED ORDER — ACEBUTOLOL HCL 200 MG PO CAPS
200.0000 mg | ORAL_CAPSULE | Freq: Two times a day (BID) | ORAL | Status: DC
  Administered 2015-11-29: 200 mg via ORAL
  Filled 2015-11-29 (×3): qty 1

## 2015-11-29 MED ORDER — FERROUS SULFATE 325 (65 FE) MG PO TABS
325.0000 mg | ORAL_TABLET | Freq: Two times a day (BID) | ORAL | Status: DC
  Administered 2015-11-29 – 2015-11-30 (×2): 325 mg via ORAL
  Filled 2015-11-29 (×3): qty 1

## 2015-11-29 NOTE — Progress Notes (Signed)
Patient ID: [name], female   DOB: 11-11-1935, 80 y.o.   MRN: [id number]  PROGRESS NOTE    [name]  [id number] DOB: 08/13/1935 DOA: 11/27/2015  PCP: Benito Mccreedy, MD   Brief Narrative:  80 y.o. female with medical history significant for hypertension, thyroid disease, anxiety. Patient presented to the emergency department with dark tarry stools for last week or so prior to the admission. No nausea or abdominal pain.  In ED, BP was 103/62, HR 76, RR 22. Oxygen saturation was 95% on room air. Blood work showed hemoglobin of 7.1. Pt has received 2 U PRBC transfusion. GI has seen the pt in consultation, recommended CT abdomen for further evaluation which did not showed evidence of bleed. EGD done 6/8 with findings of Dieulafoy lesion.   Assessment & Plan:   Upper GI bleed / Acute blood loss anemia - Likely due to Dieulafoy lesion as seen on EGD 11/28/15; Injected with Epi, hemoclipped x 3 - CT abdomen with no acute intraabdominal findings - S/p 2 units PRBC transfusion during this admission - Hemoglobin down from 9.6 to 8.8 this am - Observe for next 24 hours  - Continue PPI therapy   Hypertension, essential - Held Norvasc and Avapro this am as BP 95/49  Anxiety - Continue Klonipin as needed   DVT prophylaxis: SCD's bilaterally due to risk of bleeding  Code Status: full code  Family Communication: no family at the bedside this am  Disposition Plan: if Hgb stable at least remains above or at 8-9; /c in am   Consultants:   GI (Dr. Silvano Rusk)  Procedures:   6/8 EGD: Red blood in stomach. Dieulafoy lesion. Injected with Epi, hemoclipped x 3    Antimicrobials:   None    Subjective: No overnight events. No reports of bleeding.   Objective: Filed Vitals:   11/29/15 0512 11/29/15 0913 11/29/15 1056 11/29/15 1339  BP: 94/61 95/49 97/57  106/61  Pulse: 66 74 77 73  Temp: 97.8 F (36.6 C)  98 F (36.7 C) 97.7 F (36.5 C)  TempSrc:      Resp: 18 18  18 18   Height: 5\' 6"  (1.676 m)     Weight: 77.202 kg (170 lb 3.2 oz)     SpO2: 94% 96% 97% 96%    Intake/Output Summary (Last 24 hours) at 11/29/15 1528 Last data filed at 11/28/15 2027  Gross per 24 hour  Intake    300 ml  Output      0 ml  Net    300 ml   Filed Weights   11/29/15 0512  Weight: 77.202 kg (170 lb 3.2 oz)    Examination:  General exam: Appears in no acute distress Respiratory system: Respiratory effort normal. No wheezing  Cardiovascular system: S1 & S2 (+), rate controlled  Gastrointestinal system: (+) BS, non tender abdomen  Central nervous system: No focal neurological deficits. Extremities: no edema, palpable pulses  Skin: warm and dry Psychiatry: Normal mood and behavior   Data Reviewed: I have personally reviewed following labs and imaging studies  CBC:  Recent Labs Lab 11/27/15 1030 11/27/15 2235 11/28/15 0627 11/28/15 1448 11/29/15 0702  WBC 5.8 5.7 5.3 7.0 5.2  NEUTROABS 3.2  --   --   --   --   HGB 7.1* 8.8* 8.6* 9.6* 8.8*  HCT 21.1* 26.1* 25.8* 28.4* 26.2*  MCV 97.2 93.2 92.5 92.5 94.6  PLT 171 153 152 160 [id number]   Basic Metabolic Panel:  Recent Labs Lab  11/27/15 1030 11/27/15 2235  NA 140 139  K 4.0 3.8  CL 111 111  CO2 23 23  GLUCOSE 101* 90  BUN 19 13  CREATININE 0.77 0.65  CALCIUM 8.8* 8.6*   GFR: Estimated Creatinine Clearance: 61.8 mL/min (by C-G formula based on Cr of 0.65). Liver Function Tests: No results for input(s): AST, ALT, ALKPHOS, BILITOT, PROT, ALBUMIN in the last 168 hours. No results for input(s): LIPASE, AMYLASE in the last 168 hours. No results for input(s): AMMONIA in the last 168 hours. Coagulation Profile: No results for input(s): INR, PROTIME in the last 168 hours. Cardiac Enzymes: No results for input(s): CKTOTAL, CKMB, CKMBINDEX, TROPONINI in the last 168 hours. BNP (last 3 results) No results for input(s): PROBNP in the last 8760 hours. HbA1C: No results for input(s): HGBA1C in the last 72  hours. CBG: No results for input(s): GLUCAP in the last 168 hours. Lipid Profile: No results for input(s): CHOL, HDL, LDLCALC, TRIG, CHOLHDL, LDLDIRECT in the last 72 hours. Thyroid Function Tests: No results for input(s): TSH, T4TOTAL, FREET4, T3FREE, THYROIDAB in the last 72 hours. Anemia Panel: No results for input(s): VITAMINB12, FOLATE, FERRITIN, TIBC, IRON, RETICCTPCT in the last 72 hours. Urine analysis:    Component Value Date/Time   COLORURINE YELLOW 09/12/2010 1921   APPEARANCEUR CLEAR 09/12/2010 1921   LABSPEC 1.006 09/12/2010 1921   PHURINE 7.0 09/12/2010 1921   GLUCOSEU NEGATIVE 09/12/2010 1921   HGBUR NEGATIVE 09/12/2010 1921   BILIRUBINUR NEGATIVE 09/12/2010 1921   KETONESUR NEGATIVE 09/12/2010 1921   PROTEINUR NEGATIVE 09/12/2010 1921   UROBILINOGEN 0.2 09/12/2010 1921   NITRITE NEGATIVE 09/12/2010 1921   LEUKOCYTESUR  09/12/2010 1921    NEGATIVE MICROSCOPIC NOT DONE ON URINES WITH NEGATIVE PROTEIN, BLOOD, LEUKOCYTES, NITRITE, OR GLUCOSE <1000 mg/dL.   Sepsis Labs: @LABRCNTIP (procalcitonin:4,lacticidven:4)   )No results found for this or any previous visit (from the past 240 hour(s)).    Radiology Studies: Ct Abdomen Pelvis Wo Contrast 11/28/2015  No retroperitoneal or other hematoma to explain symptoms. Stable exam compared to 10/07/2015. Electronically Signed   By: Monte Fantasia M.D.   On: 11/28/2015 05:16    Scheduled Meds: . amLODipine  10 mg Oral Daily  . irbesartan  150 mg Oral Daily  . pantoprazole  40 mg Oral Q0600   Continuous Infusions:      Time spent: 15 minutes  Greater than 50% of the time spent on counseling and coordinating the care.   Leisa Lenz, MD [facility] Pager [phone]  If 7PM-7AM, please contact night-coverage [email or web address] Password [email or web address] 11/29/2015, 3:28 PM

## 2015-11-29 NOTE — Progress Notes (Signed)
          Daily Rounding Note  11/29/2015, 9:42 AM    SUBJECTIVE:       Pt says RN told her she is going home today.  No BMs yest or today.  Not dizzy when walking.  Feels well. Eating solids.   OBJECTIVE:         Vital signs in last 24 hours:    Temp:  [97.8 F (36.6 C)-98.3 F (36.8 C)] 97.8 F (36.6 C) (06/09 0512) Pulse Rate:  [66-100] 74 (06/09 0913) Resp:  [11-23] 18 (06/09 0913) BP: (94-172)/(48-87) 95/49 mmHg (06/09 0913) SpO2:  [94 %-100 %] 96 % (06/09 0913) Weight:  [77.202 kg (170 lb 3.2 oz)] 77.202 kg (170 lb 3.2 oz) (06/09 0512) Last BM Date: 11/26/15 Filed Weights   11/29/15 0512  Weight: 77.202 kg (170 lb 3.2 oz)   General: pleasant, anxious.  comfortable   Heart: RRR Chest: clear bil.   Abdomen: soft, NT, ND.  BS active  Extremities: no CCE Neuro/Psych:  Oriented x 3.  No gross weakness.  Anxious.    Lab Results:  Recent Labs  11/28/15 0627 11/28/15 1448 11/29/15 0702  WBC 5.3 7.0 5.2  HGB 8.6* 9.6* 8.8*  HCT 25.8* 28.4* 26.2*  PLT 152 160 164     ASSESMENT:   * Normocytic anemia. Hgb drop of near 5 gms in last 5 months.Hgb stable after PRBC x2.   Black stools x 10 days in setting of recently added Linzess and Meloxicam but she is FOBT negative on 6/6 and 6/7.  CT negative for RP or intrabdominal bleed.  6/8 EGD: Red blood in stomach.  Dieulafoy lesion. Injected with Epi, hemoclipped x 3.    * Rectal mass in 2011, resected. Pathology: hemorrhoid.   * 4.9 cm Thoracic aortic aneurysm on CT 10/07/15.     PLAN   *  Stop Meloxicam, as per PMD it stopped on 6/6.  Ok to resume the Linzess for her chronic constipation.   *  Stop PPI gtt tomorrow.  Then once daily oral PPI for one month after that.  Will order outpt CBC at [facility] lab next week.  Dr Carlean Purl will decide re need for GI office visit.       Azucena Freed  11/29/2015, 9:42 AM Pager: [phone]    [facility] Attending   I  have taken an interval history, reviewed the chart and examined the patient. I agree with the Advanced Practitioner's note, impression and recommendations.    Gatha Mayer, MD, [facility] [phone] (pager) [phone] after 5 PM, weekends and holidays  11/29/2015 7:43 PM

## 2015-11-30 DIAGNOSIS — I10 Essential (primary) hypertension: Secondary | ICD-10-CM | POA: Diagnosis not present

## 2015-11-30 DIAGNOSIS — K3182 Dieulafoy lesion (hemorrhagic) of stomach and duodenum: Secondary | ICD-10-CM | POA: Diagnosis not present

## 2015-11-30 DIAGNOSIS — K922 Gastrointestinal hemorrhage, unspecified: Secondary | ICD-10-CM | POA: Diagnosis not present

## 2015-11-30 LAB — CBC
HCT: 25.9 % — ABNORMAL LOW (ref 36.0–46.0)
Hemoglobin: 8.7 g/dL — ABNORMAL LOW (ref 12.0–15.0)
MCH: 31.6 pg (ref 26.0–34.0)
MCHC: 33.6 g/dL (ref 30.0–36.0)
MCV: 94.2 fL (ref 78.0–100.0)
Platelets: 182 10*3/uL (ref 150–400)
RBC: 2.75 MIL/uL — ABNORMAL LOW (ref 3.87–5.11)
RDW: 17.4 % — ABNORMAL HIGH (ref 11.5–15.5)
WBC: 5.3 10*3/uL (ref 4.0–10.5)

## 2015-11-30 MED ORDER — FERROUS SULFATE 325 (65 FE) MG PO TABS: 325.0000 mg | ORAL_TABLET | Freq: Two times a day (BID) | ORAL | Status: DC

## 2015-11-30 MED ORDER — PANTOPRAZOLE SODIUM 40 MG PO TBEC: 40.0000 mg | DELAYED_RELEASE_TABLET | Freq: Every day | ORAL | Status: DC

## 2015-11-30 MED ORDER — ALUM & MAG HYDROXIDE-SIMETH 200-200-20 MG/5ML PO SUSP
15.0000 mL | Freq: Four times a day (QID) | ORAL | Status: DC | PRN
  Administered 2015-11-30: 15 mL via ORAL
  Filled 2015-11-30: qty 30

## 2015-11-30 NOTE — Progress Notes (Signed)
Pt's dtr called, concerned pt having still abdominal pain, informed her of actions already taken and encouraged pt to be OOB (SEE ABOVE NOTE). Dtr concerned it may be something else, informed dtr I could call the MD again, (MD has not rounded on pt yet today-still to see).   Attempted to reassure dtr, that her mother was not presenting with extreme pain as in doubling over in the bed, etc that pt had stated she did get some relief when she went to the bathroom and had a bm, pt able to sit up, get OOB and walk around the room without difficulty. Encouraged pt to continue to move around and sit up OOB.  Message sent to pt's doctor of pt and pt's dtrs concerns as noted above. MD will call dtr, pt will be discharged today per MD.

## 2015-11-30 NOTE — Discharge Instructions (Signed)

## 2015-11-30 NOTE — Discharge Summary (Signed)
Physician Discharge Summary  [name] [id number] DOB: 22-Nov-1935 DOA: 11/27/2015  PCP: Benito Mccreedy, MD  Admit date: 11/27/2015 Discharge date: 11/30/2015  Recommendations for Outpatient Follow-up:  Avoid aspirin and NSAIDs until seen by PCP who can recheck hemoglobin and advise when is it safe to resume it.  Discharge Diagnoses:  Active Problems:   Essential hypertension, benign   Anxiety   Rectal bleed   Gastrointestinal bleeding   Black stools   Symptomatic anemia   Dieulafoy lesion of stomach   Upper GI bleed    Discharge Condition: stable   Diet recommendation: as tolerated   History of present illness:  80 y.o. female with medical history significant for hypertension, thyroid disease, anxiety. Patient presented to the emergency department with dark tarry stools for last week or so prior to the admission. No nausea or abdominal pain.  In ED, BP was 103/62, HR 76, RR 22. Oxygen saturation was 95% on room air. Blood work showed hemoglobin of 7.1. Pt has received 2 U PRBC transfusion. GI has seen the pt in consultation, recommended CT abdomen for further evaluation which did not showed evidence of bleed. EGD done 6/8 with findings of Dieulafoy lesion.   Hospital Course:   Assessment & Plan:  Upper GI bleed / Acute blood loss anemia - Likely due to Dieulafoy lesion as seen on EGD 11/28/15; Injected with Epi, hemoclipped x 3 - CT abdomen with no acute intraabdominal findings - S/p 2 units PRBC transfusion during this admission - Hemoglobin down from 9.6 to 8.8 to 8.7, stable overall  - Protonix on discharge for 1 month   Hypertension, essential - May resume home med on discharge   Anxiety - Continue Klonipin as needed   DVT prophylaxis: SCD's bilaterally due to risk of bleeding  Code Status: full code  Family Communication: no family at the bedside this am; spoke with her daughter over the phone and informed of discharge plan, all questions and  concerns addressed including pt report of wanting maalox for gas pain; slight abd discomfort possibly from EGD.   Consultants:   GI (Dr. Silvano Rusk)  Procedures:   6/8 EGD: Red blood in stomach. Dieulafoy lesion. Injected with Epi, hemoclipped x 3  Antimicrobials:   None  Signed:  Leisa Lenz, MD  [facility] 11/30/2015, 3:41 PM  Pager #: [phone]  Time spent in minutes: less than 30 minutes    Discharge Exam: Filed Vitals:   11/30/15 0943 11/30/15 1434  BP: 96/56 113/72  Pulse: 70 74  Temp:  98.3 F (36.8 C)  Resp:  18   Filed Vitals:   11/29/15 2112 11/30/15 0505 11/30/15 0943 11/30/15 1434  BP: 105/71 104/61 96/56 113/72  Pulse: 89 74 70 74  Temp: 98.2 F (36.8 C) 97.8 F (36.6 C)  98.3 F (36.8 C)  TempSrc: Oral Oral    Resp: 18 20  18   Height:      Weight:      SpO2: 95% 94%  100%    General: Pt is alert, follows commands appropriately, not in acute distress Cardiovascular: Regular rate and rhythm, S1/S2 +, no murmurs Respiratory: Clear to auscultation bilaterally, no wheezing, no crackles, no rhonchi Abdominal: Soft, non tender, non distended, bowel sounds +, no guarding Extremities: no edema, no cyanosis, pulses palpable bilaterally DP and PT Neuro: Grossly nonfocal  Discharge Instructions  Discharge Instructions    Call MD for:  difficulty breathing, headache or visual disturbances    Complete by:  As directed  Call MD for:  persistant dizziness or light-headedness    Complete by:  As directed      Call MD for:  persistant nausea and vomiting    Complete by:  As directed      Call MD for:  severe uncontrolled pain    Complete by:  As directed      Diet - low sodium heart healthy    Complete by:  As directed      Discharge instructions    Complete by:  As directed   Avoid aspirin and NSAIDs until seen by PCP who can recheck hemoglobin and advise when is it safe to resume it.     Increase activity slowly    Complete  by:  As directed             Medication List    STOP taking these medications        aspirin EC 81 MG tablet      TAKE these medications        acebutolol 200 MG capsule  Commonly known as:  SECTRAL  Take 200 mg by mouth 2 (two) times daily.     acetic acid 2 % otic solution  Commonly known as:  VOSOL     AZOR 10-20 MG tablet  Generic drug:  amlodipine-olmesartan  Take 1 tablet by mouth daily.     clonazePAM 0.5 MG tablet  Commonly known as:  KLONOPIN  TAKE 1 TABLET BY MOUTH TWICE A DAY AS NEEDED FOR ANXIETY     cholecalciferol 1000 units tablet  Commonly known as:  VITAMIN D  Take 1,000 Units by mouth daily.     [employer] D3 5000 units capsule  Generic drug:  Cholecalciferol  Take 5,000 Units by mouth once a week.     ferrous sulfate 325 (65 FE) MG tablet  Take 1 tablet (325 mg total) by mouth 2 (two) times daily with a meal.     ketorolac 0.4 % Soln  Commonly known as:  ACULAR  Place 1 drop into the right eye 4 (four) times daily.     LINZESS 145 MCG Caps capsule  Generic drug:  linaclotide  TAKE ONE CAPSULE BY MOUTH ONCE DAILY AS NEEDED FOR CONSTIPATION     meclizine 25 MG tablet  Commonly known as:  ANTIVERT  TAKE 1 TABLET BY MOUTH 3 TIMES A DAY AS NEEDED VERTIGO     meloxicam 7.5 MG tablet  Commonly known as:  MOBIC  Take 7.5 mg by mouth daily.     multivitamin,tx-minerals tablet  Take 1 tablet by mouth 2 (two) times daily.     pantoprazole 40 MG tablet  Commonly known as:  PROTONIX  Take 1 tablet (40 mg total) by mouth daily at 6 (six) AM.     prednisoLONE acetate 1 % ophthalmic suspension  Commonly known as:  PRED FORTE  Place 1 drop into the right eye 4 (four) times daily.     predniSONE 20 MG tablet  Commonly known as:  DELTASONE  Take 10 mg by mouth 2 (two) times daily.     SYSTANE 0.4-0.3 % Soln  Generic drug:  Polyethyl Glycol-Propyl Glycol  Place 1 drop into the left eye 4 (four) times daily.     traMADol 50 MG tablet  Commonly known  as:  ULTRAM  Take 1 tablet (50 mg total) by mouth every 6 (six) hours as needed (severe headaches).           Follow-up  Information    Follow up with Silvano Rusk, MD On 12/03/2015.   Specialty:  Gastroenterology   Why:  Go to basement lab and have blood work and office will call results   Contact information:   [address] [place] [address] [phone]       Follow up with OSEI-BONSU,GEORGE, MD. Schedule an appointment as soon as possible for a visit in 1 week.   Specialty:  Internal Medicine   Why:  Follow up appt after recent hospitalization   Contact information:   [id number] [place] [address] [phone]        The results of significant diagnostics from this hospitalization (including imaging, microbiology, ancillary and laboratory) are listed below for reference.    Significant Diagnostic Studies: Ct Abdomen Pelvis Wo Contrast  11/28/2015  CLINICAL DATA:  Anemia and low back pain. Evaluate for retroperitoneal hematoma. EXAM: CT ABDOMEN AND PELVIS WITHOUT CONTRAST TECHNIQUE: Multidetector CT imaging of the abdomen and pelvis was performed following the standard protocol without IV contrast. COMPARISON:  10/07/2015 FINDINGS: Lower chest and abdominal wall:  No rectus sheath hematoma. Hepatobiliary: No focal liver abnormality.No evidence of biliary obstruction or stone. Pancreas: Unremarkable. Spleen: Unremarkable. Adrenals/Urinary Tract: Negative adrenals. Stable calcification in the left upper pole at the base of cortical scarring. Right upper pole cyst. No hydronephrosis or ureteral calculus. Stable appearance of the bladder. Reproductive:Unremarkable for age Stomach/Bowel: No obstruction. No appendicitis. Colonic diverticulosis. Vascular/Lymphatic: No retroperitoneal hematoma. No mass or adenopathy. Peritoneal: No ascites or pneumoperitoneum. Musculoskeletal: Usual spinal degenerative changes. Bilateral hip osteoarthritis with mild protrusio  deformity on the right. IMPRESSION: No retroperitoneal or other hematoma to explain symptoms. Stable exam compared to 10/07/2015. Electronically Signed   By: Monte Fantasia M.D.   On: 11/28/2015 05:16    Microbiology: No results found for this or any previous visit (from the past 240 hour(s)).   Labs: Basic Metabolic Panel:  Recent Labs Lab 11/27/15 1030 11/27/15 2235  NA 140 139  K 4.0 3.8  CL 111 111  CO2 23 23  GLUCOSE 101* 90  BUN 19 13  CREATININE 0.77 0.65  CALCIUM 8.8* 8.6*   Liver Function Tests: No results for input(s): AST, ALT, ALKPHOS, BILITOT, PROT, ALBUMIN in the last 168 hours. No results for input(s): LIPASE, AMYLASE in the last 168 hours. No results for input(s): AMMONIA in the last 168 hours. CBC:  Recent Labs Lab 11/27/15 1030 11/27/15 2235 11/28/15 0627 11/28/15 1448 11/29/15 0702 11/30/15 0527  WBC 5.8 5.7 5.3 7.0 5.2 5.3  NEUTROABS 3.2  --   --   --   --   --   HGB 7.1* 8.8* 8.6* 9.6* 8.8* 8.7*  HCT 21.1* 26.1* 25.8* 28.4* 26.2* 25.9*  MCV 97.2 93.2 92.5 92.5 94.6 94.2  PLT 171 153 152 160 164 182   Cardiac Enzymes: No results for input(s): CKTOTAL, CKMB, CKMBINDEX, TROPONINI in the last 168 hours. BNP: BNP (last 3 results) No results for input(s): BNP in the last 8760 hours.  ProBNP (last 3 results) No results for input(s): PROBNP in the last 8760 hours.  CBG: No results for input(s): GLUCAP in the last 168 hours.

## 2015-11-30 NOTE — Progress Notes (Addendum)
Pt c/o abdominal cramps "gasey pain". Maalox given.  Leisa Lenz [facility] [id number]

## 2015-11-30 NOTE — Progress Notes (Addendum)
AM BP and betablocker held this am due to low BP 96/56 (acebutolol, amlodipine, irbesartan).  Pt denies dizziness or lightheadedness.

## 2015-11-30 NOTE — Progress Notes (Addendum)
Pt has had another BM, formed, brown and green in color, passed gas, feeling better, only has a little discomfort now. Asking for hot tea. Pt made aware that MD states she saw her this am and going to discharge her home today. Pt states she remembers the doctor waking her this am, but she wants to see the doctor again or talk to her. Message sent to Dr Charlies Silvers asking her to come see or call the pt.  Adelfa Koh, RN   Spoke with the pt again, talked to her about her hemoglobin level as we discussed this am, 8.7 and 8.8 the day before. She was instructed to stop asa, advil/aleve. Also talked to her about EGD findings and importance of follow up with GI. I talked to her about taking protonix and ferrous sulfate on discharge. Her daughter told me her son is on the way to pick her up.  Leisa Lenz [facility] [phone]

## 2015-11-30 NOTE — Progress Notes (Signed)
Pt feeling better, denies abdominal pain. D/c instructions reviewed with pt and her son, copy of instructions given to pt. Pt d/c'd via wheelchair with her belongings and her son, escorted by unit NT.

## 2015-12-09 ENCOUNTER — Ambulatory Visit: Payer: Medicare Other | Admitting: Gastroenterology

## 2016-01-25 ENCOUNTER — Other Ambulatory Visit: Payer: Self-pay | Admitting: Internal Medicine

## 2016-01-25 DIAGNOSIS — Z9289 Personal history of other medical treatment: Secondary | ICD-10-CM

## 2016-01-28 ENCOUNTER — Other Ambulatory Visit: Payer: Self-pay | Admitting: Internal Medicine

## 2016-01-28 DIAGNOSIS — R109 Unspecified abdominal pain: Secondary | ICD-10-CM

## 2016-02-10 ENCOUNTER — Ambulatory Visit
Admission: RE | Admit: 2016-02-10 | Discharge: 2016-02-10 | Disposition: A | Discharge status: Home / Self Care | Payer: Medicare Other | Source: Ambulatory Visit | Attending: Internal Medicine | Admitting: Internal Medicine

## 2016-02-10 ENCOUNTER — Other Ambulatory Visit: Payer: Medicare Other

## 2016-02-10 ENCOUNTER — Other Ambulatory Visit: Payer: Self-pay

## 2016-02-10 ENCOUNTER — Other Ambulatory Visit: Payer: Self-pay | Admitting: Internal Medicine

## 2016-02-10 DIAGNOSIS — R109 Unspecified abdominal pain: Secondary | ICD-10-CM

## 2016-02-10 MED ORDER — IOPAMIDOL (ISOVUE-300) INJECTION 61%
100.0000 mL | Freq: Once | INTRAVENOUS | Status: AC | PRN
  Administered 2016-02-10: 100 mL via INTRAVENOUS

## 2016-02-10 MED ORDER — IOPAMIDOL (ISOVUE-M 300) INJECTION 61%: 15.0000 mL | Freq: Once | INTRAMUSCULAR | Status: DC | PRN

## 2016-02-12 ENCOUNTER — Other Ambulatory Visit: Payer: Self-pay | Admitting: Physician Assistant

## 2016-03-25 ENCOUNTER — Other Ambulatory Visit: Payer: Self-pay | Admitting: Physician Assistant

## 2016-03-25 DIAGNOSIS — R5381 Other malaise: Secondary | ICD-10-CM

## 2016-04-09 ENCOUNTER — Other Ambulatory Visit: Payer: Self-pay | Admitting: Internal Medicine

## 2016-04-09 ENCOUNTER — Other Ambulatory Visit: Payer: Self-pay | Admitting: Physician Assistant

## 2016-04-09 DIAGNOSIS — Z1231 Encounter for screening mammogram for malignant neoplasm of breast: Secondary | ICD-10-CM

## 2016-05-11 ENCOUNTER — Ambulatory Visit: Payer: Medicare Other

## 2016-05-20 ENCOUNTER — Ambulatory Visit
Admission: RE | Admit: 2016-05-20 | Discharge: 2016-05-20 | Disposition: A | Discharge status: Home / Self Care | Payer: Medicare Other | Source: Ambulatory Visit | Attending: Internal Medicine | Admitting: Internal Medicine

## 2016-05-20 DIAGNOSIS — Z1231 Encounter for screening mammogram for malignant neoplasm of breast: Secondary | ICD-10-CM

## 2016-06-19 ENCOUNTER — Ambulatory Visit: Payer: Medicare Other

## 2016-09-24 ENCOUNTER — Other Ambulatory Visit: Payer: Self-pay | Admitting: Cardiology

## 2016-09-24 DIAGNOSIS — I712 Thoracic aortic aneurysm, without rupture: Secondary | ICD-10-CM

## 2016-09-24 DIAGNOSIS — I7121 Aneurysm of the ascending aorta, without rupture: Secondary | ICD-10-CM

## 2016-09-30 ENCOUNTER — Ambulatory Visit
Admission: RE | Admit: 2016-09-30 | Discharge: 2016-09-30 | Disposition: A | Discharge status: Home / Self Care | Payer: Medicare Other | Source: Ambulatory Visit | Attending: Cardiology | Admitting: Cardiology

## 2016-09-30 DIAGNOSIS — I7121 Aneurysm of the ascending aorta, without rupture: Secondary | ICD-10-CM

## 2016-09-30 DIAGNOSIS — I712 Thoracic aortic aneurysm, without rupture: Secondary | ICD-10-CM

## 2016-09-30 MED ORDER — IOPAMIDOL (ISOVUE-370) INJECTION 76%
75.0000 mL | Freq: Once | INTRAVENOUS | Status: AC | PRN
  Administered 2016-09-30: 75 mL via INTRAVENOUS

## 2017-05-03 ENCOUNTER — Other Ambulatory Visit: Payer: Self-pay | Admitting: Internal Medicine

## 2017-05-03 DIAGNOSIS — Z1231 Encounter for screening mammogram for malignant neoplasm of breast: Secondary | ICD-10-CM

## 2017-06-02 ENCOUNTER — Ambulatory Visit: Payer: Medicare Other

## 2017-06-08 DIAGNOSIS — R1013 Epigastric pain: Secondary | ICD-10-CM | POA: Diagnosis not present

## 2017-06-08 DIAGNOSIS — R7303 Prediabetes: Secondary | ICD-10-CM | POA: Diagnosis not present

## 2017-06-08 DIAGNOSIS — I10 Essential (primary) hypertension: Secondary | ICD-10-CM | POA: Diagnosis not present

## 2017-06-08 DIAGNOSIS — E785 Hyperlipidemia, unspecified: Secondary | ICD-10-CM | POA: Diagnosis not present

## 2017-06-08 DIAGNOSIS — M19072 Primary osteoarthritis, left ankle and foot: Secondary | ICD-10-CM | POA: Diagnosis not present

## 2017-06-29 DIAGNOSIS — R7303 Prediabetes: Secondary | ICD-10-CM | POA: Diagnosis not present

## 2017-06-29 DIAGNOSIS — I10 Essential (primary) hypertension: Secondary | ICD-10-CM | POA: Diagnosis not present

## 2017-06-29 DIAGNOSIS — I739 Peripheral vascular disease, unspecified: Secondary | ICD-10-CM | POA: Diagnosis not present

## 2017-06-29 DIAGNOSIS — E785 Hyperlipidemia, unspecified: Secondary | ICD-10-CM | POA: Diagnosis not present

## 2017-06-29 DIAGNOSIS — M19072 Primary osteoarthritis, left ankle and foot: Secondary | ICD-10-CM | POA: Diagnosis not present

## 2017-07-01 ENCOUNTER — Ambulatory Visit
Admission: RE | Admit: 2017-07-01 | Discharge: 2017-07-01 | Disposition: A | Discharge status: Home / Self Care | Payer: Medicare Other | Source: Ambulatory Visit | Attending: Internal Medicine | Admitting: Internal Medicine

## 2017-07-01 DIAGNOSIS — Z1231 Encounter for screening mammogram for malignant neoplasm of breast: Secondary | ICD-10-CM | POA: Diagnosis not present

## 2017-07-01 DIAGNOSIS — I10 Essential (primary) hypertension: Secondary | ICD-10-CM | POA: Diagnosis not present

## 2017-07-01 DIAGNOSIS — R002 Palpitations: Secondary | ICD-10-CM | POA: Diagnosis not present

## 2017-07-01 DIAGNOSIS — K219 Gastro-esophageal reflux disease without esophagitis: Secondary | ICD-10-CM | POA: Diagnosis not present

## 2017-07-01 DIAGNOSIS — I712 Thoracic aortic aneurysm, without rupture: Secondary | ICD-10-CM | POA: Diagnosis not present

## 2017-07-14 DIAGNOSIS — R1013 Epigastric pain: Secondary | ICD-10-CM | POA: Diagnosis not present

## 2017-07-14 DIAGNOSIS — R7303 Prediabetes: Secondary | ICD-10-CM | POA: Diagnosis not present

## 2017-07-14 DIAGNOSIS — E785 Hyperlipidemia, unspecified: Secondary | ICD-10-CM | POA: Diagnosis not present

## 2017-07-16 DIAGNOSIS — H04123 Dry eye syndrome of bilateral lacrimal glands: Secondary | ICD-10-CM | POA: Diagnosis not present

## 2017-07-16 DIAGNOSIS — H35351 Cystoid macular degeneration, right eye: Secondary | ICD-10-CM | POA: Diagnosis not present

## 2017-07-16 DIAGNOSIS — D23112 Other benign neoplasm of skin of right lower eyelid, including canthus: Secondary | ICD-10-CM | POA: Diagnosis not present

## 2017-07-19 DIAGNOSIS — R002 Palpitations: Secondary | ICD-10-CM | POA: Diagnosis not present

## 2017-07-19 DIAGNOSIS — I712 Thoracic aortic aneurysm, without rupture: Secondary | ICD-10-CM | POA: Diagnosis not present

## 2017-07-19 DIAGNOSIS — I10 Essential (primary) hypertension: Secondary | ICD-10-CM | POA: Diagnosis not present

## 2017-07-27 DIAGNOSIS — R002 Palpitations: Secondary | ICD-10-CM | POA: Diagnosis not present

## 2017-07-27 DIAGNOSIS — K219 Gastro-esophageal reflux disease without esophagitis: Secondary | ICD-10-CM | POA: Diagnosis not present

## 2017-07-27 DIAGNOSIS — I712 Thoracic aortic aneurysm, without rupture: Secondary | ICD-10-CM | POA: Diagnosis not present

## 2017-07-27 DIAGNOSIS — I10 Essential (primary) hypertension: Secondary | ICD-10-CM | POA: Diagnosis not present

## 2017-07-28 DIAGNOSIS — I10 Essential (primary) hypertension: Secondary | ICD-10-CM | POA: Diagnosis not present

## 2017-07-29 DIAGNOSIS — I10 Essential (primary) hypertension: Secondary | ICD-10-CM | POA: Diagnosis not present

## 2017-07-29 DIAGNOSIS — R1013 Epigastric pain: Secondary | ICD-10-CM | POA: Diagnosis not present

## 2017-07-29 DIAGNOSIS — E785 Hyperlipidemia, unspecified: Secondary | ICD-10-CM | POA: Diagnosis not present

## 2017-07-29 DIAGNOSIS — M19072 Primary osteoarthritis, left ankle and foot: Secondary | ICD-10-CM | POA: Diagnosis not present

## 2017-08-07 ENCOUNTER — Encounter (HOSPITAL_COMMUNITY): Payer: Self-pay | Admitting: Emergency Medicine

## 2017-08-07 ENCOUNTER — Emergency Department (HOSPITAL_COMMUNITY): Payer: Medicare Other

## 2017-08-07 ENCOUNTER — Other Ambulatory Visit: Payer: Self-pay

## 2017-08-07 DIAGNOSIS — Z79899 Other long term (current) drug therapy: Secondary | ICD-10-CM | POA: Insufficient documentation

## 2017-08-07 DIAGNOSIS — I700 Atherosclerosis of aorta: Secondary | ICD-10-CM | POA: Diagnosis not present

## 2017-08-07 DIAGNOSIS — R079 Chest pain, unspecified: Secondary | ICD-10-CM | POA: Diagnosis not present

## 2017-08-07 DIAGNOSIS — I10 Essential (primary) hypertension: Secondary | ICD-10-CM | POA: Diagnosis not present

## 2017-08-07 NOTE — ED Triage Notes (Signed)
Pt c/o 10/10 central cp since today at 2 pm, no nausea, vomiting, dizziness or SOB at this time.

## 2017-08-08 ENCOUNTER — Emergency Department (HOSPITAL_COMMUNITY): Payer: Medicare Other

## 2017-08-08 ENCOUNTER — Emergency Department (HOSPITAL_COMMUNITY)
Admission: EM | Admit: 2017-08-08 | Discharge: 2017-08-08 | Disposition: A | Discharge status: Home / Self Care | Payer: Medicare Other | Attending: Emergency Medicine | Admitting: Emergency Medicine

## 2017-08-08 DIAGNOSIS — R079 Chest pain, unspecified: Secondary | ICD-10-CM

## 2017-08-08 DIAGNOSIS — I700 Atherosclerosis of aorta: Secondary | ICD-10-CM | POA: Diagnosis not present

## 2017-08-08 LAB — CBC
HCT: 36.4 % (ref 36.0–46.0)
Hemoglobin: 12.6 g/dL (ref 12.0–15.0)
MCH: 32.8 pg (ref 26.0–34.0)
MCHC: 34.6 g/dL (ref 30.0–36.0)
MCV: 94.8 fL (ref 78.0–100.0)
Platelets: 192 10*3/uL (ref 150–400)
RBC: 3.84 MIL/uL — ABNORMAL LOW (ref 3.87–5.11)
RDW: 13.6 % (ref 11.5–15.5)
WBC: 6.3 10*3/uL (ref 4.0–10.5)

## 2017-08-08 LAB — BASIC METABOLIC PANEL
Anion gap: 11 (ref 5–15)
BUN: 10 mg/dL (ref 6–20)
CO2: 22 mmol/L (ref 22–32)
Calcium: 9.5 mg/dL (ref 8.9–10.3)
Chloride: 104 mmol/L (ref 101–111)
Creatinine, Ser: 0.79 mg/dL (ref 0.44–1.00)
GFR calc Af Amer: >60 mL/min (ref >60)
GFR calc non Af Amer: >60 mL/min (ref >60)
Glucose, Bld: 109 mg/dL — ABNORMAL HIGH (ref 65–99)
Potassium: 3.9 mmol/L (ref 3.5–5.1)
Sodium: 137 mmol/L (ref 135–145)

## 2017-08-08 LAB — I-STAT TROPONIN, ED
Troponin i, poc: 0 ng/mL (ref 0.00–0.08)
Troponin i, poc: 0 ng/mL (ref 0.00–0.08)

## 2017-08-08 MED ORDER — IOPAMIDOL (ISOVUE-370) INJECTION 76%
INTRAVENOUS | Status: AC
  Administered 2017-08-08: 100 mL
  Filled 2017-08-08: qty 100

## 2017-08-08 NOTE — Discharge Instructions (Signed)
No abnormalities with your heart were found during your visit today. There is no sign of enlargement of your previously diagnosed aneurysm. Mylanta or antacid of choice as needed if your symptoms recur. Your symptoms worsen, please recheck here, or with Dr. Einar Gip

## 2017-08-08 NOTE — ED Provider Notes (Signed)
[facility] EMERGENCY DEPARTMENT Provider Note   CSN: [id number] Arrival date & time: 08/07/17  2342     History   Chief Complaint Chief Complaint  Patient presents with  . Chest Pain    HPI [name] is a 82 y.o. female.  Chief complaint is intermittent chest pain  HPI [name] is a 82 year old female.  Has a history of anxiety.  Also history of known thoracic aortic aneurysm  She went to church yesterday.  She felt well.  Mid afternoon started having intermittent episodes of pain in her chest lasting from a few minutes to as long as 10 minutes at a time.  Sharp and occasionally burning.  They have resolved.  Unfortunately she did have a prolonged wait time in the emergency room waiting room tonight.  Her EKG is normal and her first troponin is normal and she is symptom-free at the time of my exam.  Past Medical History:  Diagnosis Date  . Anxiety    takes Klonopin daily  as needed  . Diffuse cystic mastopathy   . GERD (gastroesophageal reflux disease)   . Headache   . History of colonoscopy   . HTN (hypertension)    takes Azor daily   . Hyperlipidemia   . Macular degeneration (senile) of retina, unspecified   . Nontoxic uninodular goiter   . Palpitations    takes Sectral nightly  . Pre-diabetes   . Thoracic aortic aneurysm ([place])   . Vertigo    takes Meclizine daily as needed    Patient Active Problem List   Diagnosis Date Noted  . Dieulafoy lesion of stomach   . Upper GI bleed   . Rectal bleed 11/27/2015  . Gastrointestinal bleeding 11/27/2015  . Black stools 11/27/2015  . Symptomatic anemia 11/27/2015  . Optic neuropathy 09/01/2015  . Multinodular goiter (nontoxic) 12/26/2012  . Heart palpitations 03/13/2012  . DJD (degenerative joint disease) of knee 12/14/2011  . Anemia, unspecified 11/13/2010  . GERD (gastroesophageal reflux disease) 09/15/2010  . SVT/ PSVT/ PAT 08/07/2010  . Anxiety 06/26/2010  . Thoracic aneurysm without  mention of rupture 05/30/2010  . HYPERLIPIDEMIA 02/20/2010  . THYROID NODULE 10/29/2007  . MACULAR DEGENERATION 10/29/2007  . Essential hypertension, benign 10/29/2007    Past Surgical History:  Procedure Laterality Date  . CATARACT EXTRACTION W/PHACO Right 04/03/2015   Procedure: CATARACT EXTRACTION PHACO AND INTRAOCULAR LENS PLACEMENT (IOC);  Surgeon: Marylynn Pearson, MD;  Location: [facility];  Service: Ophthalmology;  Laterality: Right;  . ESOPHAGOGASTRODUODENOSCOPY N/A 11/28/2015   Procedure: ESOPHAGOGASTRODUODENOSCOPY (EGD);  Surgeon: Gatha Mayer, MD;  Location: [facility] ENDOSCOPY;  Service: Endoscopy;  Laterality: N/A;  . TOTAL KNEE ARTHROPLASTY    . TRANSANAL EXCISION OF RECTAL MASS  07/2010   pathology: hemorrhoid.     OB History    No data available       Home Medications    Prior to Admission medications   Medication Sig Start Date End Date Taking? Authorizing Provider  acebutolol (SECTRAL) 200 MG capsule Take 200 mg by mouth 2 (two) times daily.    [provider]  acetic acid (VOSOL) 2 % otic solution  11/26/15   [provider]  AZOR 10-20 MG tablet Take 1 tablet by mouth daily. 03/04/15   [provider]  cholecalciferol (VITAMIN D) 1000 units tablet Take 1,000 Units by mouth daily.    [provider]  clonazePAM (KLONOPIN) 0.5 MG tablet TAKE 1 TABLET BY MOUTH TWICE A DAY AS  NEEDED FOR ANXIETY 04/25/12   Janith Lima, MD  [employer] D3 5000 UNITS capsule Take 5,000 Units by mouth once a week.  03/20/15   [provider]  ferrous sulfate 325 (65 FE) MG tablet Take 1 tablet (325 mg total) by mouth 2 (two) times daily with a meal. 11/30/15   Robbie Lis, MD  ketorolac (ACULAR) 0.4 % SOLN Place 1 drop into the right eye 4 (four) times daily. 08/27/15   [provider]  LINZESS 145 MCG CAPS capsule TAKE ONE CAPSULE BY MOUTH ONCE DAILY AS NEEDED FOR CONSTIPATION 10/15/15   [provider]  meclizine (ANTIVERT) 25 MG tablet TAKE 1  TABLET BY MOUTH 3 TIMES A DAY AS NEEDED VERTIGO 09/28/11   Janith Lima, MD  meloxicam (MOBIC) 7.5 MG tablet Take 7.5 mg by mouth daily. 11/15/15   [provider]  Multiple Vitamins-Minerals (MULTIVITAMIN,TX-MINERALS) tablet Take 1 tablet by mouth 2 (two) times daily.     [provider]  pantoprazole (PROTONIX) 40 MG tablet Take 1 tablet (40 mg total) by mouth daily at 6 (six) AM. 11/30/15   Robbie Lis, MD  Polyethyl Glycol-Propyl Glycol (SYSTANE) 0.4-0.3 % SOLN Place 1 drop into the left eye 4 (four) times daily.    [provider]  prednisoLONE acetate (PRED FORTE) 1 % ophthalmic suspension Place 1 drop into the right eye 4 (four) times daily. 08/27/15   [provider]  predniSONE (DELTASONE) 20 MG tablet Take 10 mg by mouth 2 (two) times daily.    [provider]  traMADol (ULTRAM) 50 MG tablet Take 1 tablet (50 mg total) by mouth every 6 (six) hours as needed (severe headaches). 09/08/15   Varney Biles, MD    Family History Family History  Problem Relation Age of Onset  . Coronary artery disease Other   . Colon cancer Neg Hx   . Cancer Neg Hx   . Heart disease Neg Hx   . Stroke Neg Hx   . Neuropathy Neg Hx   . Multiple sclerosis Neg Hx   . Migraines Neg Hx     Social History Social History   Tobacco Use  . Smoking status: Never Smoker  . Smokeless tobacco: Never Used  Substance Use Topics  . Alcohol use: No  . Drug use: No     Allergies   Statins; Prednisone; and Ibuprofen   Review of Systems Review of Systems  Constitutional: Negative for appetite change, chills, diaphoresis, fatigue and fever.  HENT: Negative for mouth sores, sore throat and trouble swallowing.   Eyes: Negative for visual disturbance.  Respiratory: Negative for cough, chest tightness, shortness of breath and wheezing.   Cardiovascular: Positive for chest pain.  Gastrointestinal: Negative for abdominal distention, abdominal pain, diarrhea, nausea  and vomiting.  Endocrine: Negative for polydipsia, polyphagia and polyuria.  Genitourinary: Negative for dysuria, frequency and hematuria.  Musculoskeletal: Negative for gait problem.  Skin: Negative for color change, pallor and rash.  Neurological: Negative for dizziness, syncope, light-headedness and headaches.  Hematological: Does not bruise/bleed easily.  Psychiatric/Behavioral: Negative for behavioral problems and confusion.     Physical Exam Updated Vital Signs BP 137/87   Pulse 74   Temp 98.2 F (36.8 C) (Oral)   Resp 14   Ht 5\' 6"  (1.676 m)   Wt 78.9 kg (174 lb)   SpO2 97%   BMI 28.08 kg/m   Physical Exam  Constitutional: She is oriented to person, place, and time. She appears  well-developed and well-nourished. No distress.  HENT:  Head: Normocephalic.  Eyes: Conjunctivae are normal. Pupils are equal, round, and reactive to light. No scleral icterus.  Neck: Normal range of motion. Neck supple. No thyromegaly present.  Cardiovascular: Normal rate and regular rhythm. Exam reveals no gallop and no friction rub.  No murmur heard. Pulmonary/Chest: Effort normal and breath sounds normal. No respiratory distress. She has no wheezes. She has no rales.  Abdominal: Soft. Bowel sounds are normal. She exhibits no distension. There is no tenderness. There is no rebound.  Musculoskeletal: Normal range of motion.  Neurological: She is alert and oriented to person, place, and time.  Skin: Skin is warm and dry. No rash noted.  Psychiatric: She has a normal mood and affect. Her behavior is normal.     ED Treatments / Results  Labs (all labs ordered are listed, but only abnormal results are displayed) Labs Reviewed  BASIC METABOLIC PANEL - Abnormal; Notable for the following components:      Result Value   Glucose, Bld 109 (*)    All other components within normal limits  CBC - Abnormal; Notable for the following components:   RBC 3.84 (*)    All other components within normal  limits  I-STAT TROPONIN, ED  I-STAT TROPONIN, ED    EKG  EKG Interpretation  Date/Time:  Saturday August 07 2017 23:46:49 EST Ventricular Rate:  81 PR Interval:  232 QRS Duration: 76 QT Interval:  396 QTC Calculation: 460 R Axis:     Text Interpretation:  Sinus rhythm with 1st degree A-V block Otherwise normal ECG No significant change vs comparison Confirmed by Tanna Furry [phone]) on 08/08/2017 1:28:33 AM       Radiology Dg Chest 2 View  Result Date: 08/08/2017 CLINICAL DATA:  Chest pain today. EXAM: CHEST  2 VIEW COMPARISON:  Chest radiograph 09/08/2014.  Chest CT 09/30/2016 FINDINGS: Unchanged heart size and mediastinal contours with tortuous thoracic aorta, patient with known ascending aortic aneurysm. No pulmonary edema, confluent consolidation, pleural effusion or pneumothorax. No acute osseous abnormalities are seen. Chronic change about both shoulders. IMPRESSION: No acute radiographic abnormality. Stable aortic tortuosity, patient with known ascending aortic aneurysm. Electronically Signed   By: Jeb Levering M.D.   On: 08/08/2017 00:12   Ct Angio Chest/abd/pel For Dissection W And/or W/wo  Result Date: 08/08/2017 CLINICAL DATA:  Chest pain EXAM: CT ANGIOGRAPHY CHEST, ABDOMEN AND PELVIS TECHNIQUE: Multidetector CT imaging through the chest, abdomen and pelvis was performed using the standard protocol during bolus administration of intravenous contrast. Multiplanar reconstructed images and MIPs were obtained and reviewed to evaluate the vascular anatomy. CONTRAST:  134mL ISOVUE-370 IOPAMIDOL (ISOVUE-370) INJECTION 76% COMPARISON:  Chest CT 10/07/2015 CT abdomen pelvis 02/10/2016 FINDINGS: CTA CHEST FINDINGS Cardiovascular: Heart size is normal. There is no pericardial effusion. The course and caliber of the thoracic aorta are normal. There is mild aortic atherosclerotic calcification. There is no intramural hematoma or blood pool and no dissection or penetrating ulcer. Variant  aortic arch branching pattern with independent origin of the left vertebral artery and common origin of the left common carotid and brachiocephalic arteries. The central pulmonary arteries are normal. Mediastinum/Nodes: No mediastinal, hilar or axillary lymphadenopathy. The visualized thyroid and thoracic esophageal course are unremarkable. Lungs/Pleura: No pulmonary nodules or masses. No pleural effusion or pneumothorax. No focal airspace consolidation. No focal pleural abnormality. Musculoskeletal: No chest wall abnormality. No acute osseous abnormality. Review of the MIP images confirms the above findings. CTA ABDOMEN AND PELVIS  FINDINGS VASCULAR Aorta: Normal caliber aorta without aneurysm, dissection, vasculitis or hemodynamically significant stenosis. There is no aortic atherosclerosis. Celiac: No aneurysm, dissection or hemodynamically significant stenosis. SMA: Widely patent without dissection or stenosis. Replaced right hepatic artery arises from the SMA. Renals: Single renal arteries bilaterally. No aneurysm, dissection, stenosis or evidence of fibromuscular dysplasia. IMA: Patent. There is a posteriorly projecting aneurysm of the IMA that measures 6 x 6 mm (series 6, image 178). Inflow: There is calcific atherosclerotic disease of the common, internal and external iliac arteries but no dissection or flow-limiting stenosis. Veins: Normal course and caliber of the major veins. Assessment is otherwise limited by the arterial dominant contrast phase. Review of the MIP images confirms the above findings. NON-VASCULAR Hepatobiliary: Normal hepatic contours and density. No visible biliary dilatation. Normal gallbladder. Pancreas: Normal contours without ductal dilatation. No peripancreatic fluid collection. Spleen: Normal arterial phase splenic enhancement pattern. Adrenals/Urinary Tract: --Adrenal glands: Normal. --Right kidney/ureter: No hydronephrosis or perinephric stranding. No nephrolithiasis. No  obstructing ureteral stones. --Left kidney/ureter: No hydronephrosis or perinephric stranding. No nephrolithiasis. No obstructing ureteral stones. --Urinary bladder: Unremarkable. Stomach/Bowel: --Stomach/Duodenum: No hiatal hernia or other gastric abnormality. Normal duodenal course and caliber. --Small bowel: No dilatation or inflammation. --Colon: No focal abnormality. --Appendix: Normal. Lymphatic:  No abdominal or pelvic lymphadenopathy. Reproductive: Prostate is mildly enlarged, measuring 5 cm. Musculoskeletal. Multilevel degenerative disc disease and facet arthrosis. No bony spinal canal stenosis. Other: None. Review of the MIP images confirms the above findings. IMPRESSION: 1. No acute aortic syndrome or other acute abnormality of the chest, abdomen or pelvis. 2.  Aortic Atherosclerosis (ICD10-I70.0). 3. 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 4. Mildly enlarged prostate gland. Electronically Signed   By: Ulyses Jarred M.D.   On: 08/08/2017 05:43    Procedures Procedures (including critical care time)  Medications Ordered in ED Medications  iopamidol (ISOVUE-370) 76 % injection (100 mLs  Contrast Given 08/08/17 0515)   EKG Interpretation  Date/Time:  Saturday August 07 2017 23:46:49 EST Ventricular Rate:  81 PR Interval:  232 QRS Duration: 76 QT Interval:  396 QTC Calculation: 460 R Axis:     Text Interpretation:  Sinus rhythm with 1st degree A-V block Otherwise normal ECG No significant change vs comparison Confirmed by Tanna Furry [phone]) on 08/08/2017 1:28:33 AM   Initial Impression / Assessment and Plan / ED Course  I have reviewed the triage vital signs and the nursing notes.  Pertinent labs & imaging results that were available during my care of the patient were reviewed by me and considered in my medical decision making (see chart for details).   Aortic CT does not show aneurysmal dilatation of the thoracic  aorta.  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  She is not symptomatic with abdominal pain.  No symptoms to suggest ischemic bowel.  Has a nontender benign abdominal exam.  Plan is discharge home.  Follow-up with her cardiologist who follows her for her aneurysm, Dr. Einar Gip.  Antacids.  Recheck with new or worsening symptoms.   Final Clinical Impressions(s) / ED Diagnoses   Final diagnoses:  Chest pain, unspecified type    ED Discharge Orders    None       Tanna Furry, MD 08/08/17 0600

## 2017-08-09 DIAGNOSIS — D23112 Other benign neoplasm of skin of right lower eyelid, including canthus: Secondary | ICD-10-CM | POA: Diagnosis not present

## 2017-08-23 DIAGNOSIS — H04123 Dry eye syndrome of bilateral lacrimal glands: Secondary | ICD-10-CM | POA: Diagnosis not present

## 2017-08-25 DIAGNOSIS — E785 Hyperlipidemia, unspecified: Secondary | ICD-10-CM | POA: Diagnosis not present

## 2017-08-25 DIAGNOSIS — I10 Essential (primary) hypertension: Secondary | ICD-10-CM | POA: Diagnosis not present

## 2017-09-07 DIAGNOSIS — I712 Thoracic aortic aneurysm, without rupture: Secondary | ICD-10-CM | POA: Diagnosis not present

## 2017-09-07 DIAGNOSIS — R002 Palpitations: Secondary | ICD-10-CM | POA: Diagnosis not present

## 2017-09-07 DIAGNOSIS — I10 Essential (primary) hypertension: Secondary | ICD-10-CM | POA: Diagnosis not present

## 2017-09-21 DIAGNOSIS — R002 Palpitations: Secondary | ICD-10-CM | POA: Diagnosis not present

## 2017-09-29 DIAGNOSIS — I712 Thoracic aortic aneurysm, without rupture: Secondary | ICD-10-CM | POA: Diagnosis not present

## 2017-09-29 DIAGNOSIS — R002 Palpitations: Secondary | ICD-10-CM | POA: Diagnosis not present

## 2017-09-29 DIAGNOSIS — I10 Essential (primary) hypertension: Secondary | ICD-10-CM | POA: Diagnosis not present

## 2017-10-04 DIAGNOSIS — D500 Iron deficiency anemia secondary to blood loss (chronic): Secondary | ICD-10-CM | POA: Diagnosis not present

## 2017-10-04 DIAGNOSIS — E785 Hyperlipidemia, unspecified: Secondary | ICD-10-CM | POA: Diagnosis not present

## 2017-10-04 DIAGNOSIS — M19072 Primary osteoarthritis, left ankle and foot: Secondary | ICD-10-CM | POA: Diagnosis not present

## 2017-10-04 DIAGNOSIS — Z0110 Encounter for examination of ears and hearing without abnormal findings: Secondary | ICD-10-CM | POA: Diagnosis not present

## 2017-10-04 DIAGNOSIS — I10 Essential (primary) hypertension: Secondary | ICD-10-CM | POA: Diagnosis not present

## 2017-10-04 DIAGNOSIS — Z131 Encounter for screening for diabetes mellitus: Secondary | ICD-10-CM | POA: Diagnosis not present

## 2017-10-04 DIAGNOSIS — R1310 Dysphagia, unspecified: Secondary | ICD-10-CM | POA: Diagnosis not present

## 2017-10-04 DIAGNOSIS — R7303 Prediabetes: Secondary | ICD-10-CM | POA: Diagnosis not present

## 2017-10-04 DIAGNOSIS — Z0000 Encounter for general adult medical examination without abnormal findings: Secondary | ICD-10-CM | POA: Diagnosis not present

## 2017-10-06 DIAGNOSIS — I119 Hypertensive heart disease without heart failure: Secondary | ICD-10-CM | POA: Diagnosis not present

## 2017-10-06 DIAGNOSIS — I10 Essential (primary) hypertension: Secondary | ICD-10-CM | POA: Diagnosis not present

## 2017-10-19 DIAGNOSIS — H04123 Dry eye syndrome of bilateral lacrimal glands: Secondary | ICD-10-CM | POA: Diagnosis not present

## 2017-10-21 DIAGNOSIS — E785 Hyperlipidemia, unspecified: Secondary | ICD-10-CM | POA: Diagnosis not present

## 2017-10-21 DIAGNOSIS — R7303 Prediabetes: Secondary | ICD-10-CM | POA: Diagnosis not present

## 2017-10-21 DIAGNOSIS — Z0000 Encounter for general adult medical examination without abnormal findings: Secondary | ICD-10-CM | POA: Diagnosis not present

## 2017-10-21 DIAGNOSIS — I10 Essential (primary) hypertension: Secondary | ICD-10-CM | POA: Diagnosis not present

## 2017-10-21 DIAGNOSIS — M19072 Primary osteoarthritis, left ankle and foot: Secondary | ICD-10-CM | POA: Diagnosis not present

## 2017-11-05 ENCOUNTER — Encounter: Payer: Self-pay | Admitting: Gastroenterology

## 2017-11-09 DIAGNOSIS — R94120 Abnormal auditory function study: Secondary | ICD-10-CM | POA: Diagnosis not present

## 2017-11-09 DIAGNOSIS — H9313 Tinnitus, bilateral: Secondary | ICD-10-CM | POA: Insufficient documentation

## 2017-11-09 DIAGNOSIS — H903 Sensorineural hearing loss, bilateral: Secondary | ICD-10-CM | POA: Insufficient documentation

## 2017-11-10 DIAGNOSIS — I10 Essential (primary) hypertension: Secondary | ICD-10-CM | POA: Diagnosis not present

## 2017-11-10 DIAGNOSIS — E785 Hyperlipidemia, unspecified: Secondary | ICD-10-CM | POA: Diagnosis not present

## 2017-11-17 DIAGNOSIS — I10 Essential (primary) hypertension: Secondary | ICD-10-CM | POA: Diagnosis not present

## 2017-11-23 DIAGNOSIS — I10 Essential (primary) hypertension: Secondary | ICD-10-CM | POA: Diagnosis not present

## 2017-11-24 DIAGNOSIS — I10 Essential (primary) hypertension: Secondary | ICD-10-CM | POA: Diagnosis not present

## 2017-12-07 DIAGNOSIS — M542 Cervicalgia: Secondary | ICD-10-CM | POA: Diagnosis not present

## 2017-12-07 DIAGNOSIS — M19011 Primary osteoarthritis, right shoulder: Secondary | ICD-10-CM | POA: Diagnosis not present

## 2017-12-07 DIAGNOSIS — M19012 Primary osteoarthritis, left shoulder: Secondary | ICD-10-CM | POA: Diagnosis not present

## 2018-01-03 DIAGNOSIS — M19072 Primary osteoarthritis, left ankle and foot: Secondary | ICD-10-CM | POA: Diagnosis not present

## 2018-01-03 DIAGNOSIS — R1013 Epigastric pain: Secondary | ICD-10-CM | POA: Diagnosis not present

## 2018-01-03 DIAGNOSIS — I10 Essential (primary) hypertension: Secondary | ICD-10-CM | POA: Diagnosis not present

## 2018-01-03 DIAGNOSIS — Z131 Encounter for screening for diabetes mellitus: Secondary | ICD-10-CM | POA: Diagnosis not present

## 2018-01-04 ENCOUNTER — Other Ambulatory Visit: Payer: Self-pay | Admitting: Orthopaedic Surgery

## 2018-01-04 DIAGNOSIS — M25512 Pain in left shoulder: Principal | ICD-10-CM

## 2018-01-04 DIAGNOSIS — I10 Essential (primary) hypertension: Secondary | ICD-10-CM | POA: Diagnosis not present

## 2018-01-04 DIAGNOSIS — G8929 Other chronic pain: Secondary | ICD-10-CM

## 2018-01-04 DIAGNOSIS — M19011 Primary osteoarthritis, right shoulder: Secondary | ICD-10-CM | POA: Diagnosis not present

## 2018-01-04 DIAGNOSIS — D500 Iron deficiency anemia secondary to blood loss (chronic): Secondary | ICD-10-CM | POA: Diagnosis not present

## 2018-01-05 ENCOUNTER — Ambulatory Visit: Payer: Medicare Other | Admitting: Gastroenterology

## 2018-01-09 ENCOUNTER — Other Ambulatory Visit: Payer: Medicaid Other

## 2018-01-19 DIAGNOSIS — R7303 Prediabetes: Secondary | ICD-10-CM | POA: Diagnosis not present

## 2018-01-19 DIAGNOSIS — I10 Essential (primary) hypertension: Secondary | ICD-10-CM | POA: Diagnosis not present

## 2018-01-21 DIAGNOSIS — H04122 Dry eye syndrome of left lacrimal gland: Secondary | ICD-10-CM | POA: Diagnosis not present

## 2018-01-21 DIAGNOSIS — H04121 Dry eye syndrome of right lacrimal gland: Secondary | ICD-10-CM | POA: Diagnosis not present

## 2018-01-24 DIAGNOSIS — D500 Iron deficiency anemia secondary to blood loss (chronic): Secondary | ICD-10-CM | POA: Diagnosis not present

## 2018-01-24 DIAGNOSIS — M19072 Primary osteoarthritis, left ankle and foot: Secondary | ICD-10-CM | POA: Diagnosis not present

## 2018-01-24 DIAGNOSIS — R7303 Prediabetes: Secondary | ICD-10-CM | POA: Diagnosis not present

## 2018-01-24 DIAGNOSIS — E785 Hyperlipidemia, unspecified: Secondary | ICD-10-CM | POA: Diagnosis not present

## 2018-01-24 DIAGNOSIS — R1013 Epigastric pain: Secondary | ICD-10-CM | POA: Diagnosis not present

## 2018-01-24 DIAGNOSIS — I10 Essential (primary) hypertension: Secondary | ICD-10-CM | POA: Diagnosis not present

## 2018-01-25 ENCOUNTER — Ambulatory Visit: Payer: Medicaid Other | Attending: Orthopaedic Surgery | Admitting: Physical Therapy

## 2018-02-08 ENCOUNTER — Institutional Professional Consult (permissible substitution): Payer: Medicaid Other | Admitting: Neurology

## 2018-02-09 DIAGNOSIS — R7303 Prediabetes: Secondary | ICD-10-CM | POA: Diagnosis not present

## 2018-02-09 DIAGNOSIS — R1013 Epigastric pain: Secondary | ICD-10-CM | POA: Diagnosis not present

## 2018-02-09 DIAGNOSIS — E785 Hyperlipidemia, unspecified: Secondary | ICD-10-CM | POA: Diagnosis not present

## 2018-03-04 DIAGNOSIS — H04123 Dry eye syndrome of bilateral lacrimal glands: Secondary | ICD-10-CM | POA: Diagnosis not present

## 2018-03-11 ENCOUNTER — Ambulatory Visit: Payer: Medicaid Other | Admitting: Gastroenterology

## 2018-03-17 ENCOUNTER — Institutional Professional Consult (permissible substitution): Payer: Medicaid Other | Admitting: Neurology

## 2018-04-06 DIAGNOSIS — E785 Hyperlipidemia, unspecified: Secondary | ICD-10-CM | POA: Diagnosis not present

## 2018-04-06 DIAGNOSIS — M19072 Primary osteoarthritis, left ankle and foot: Secondary | ICD-10-CM | POA: Diagnosis not present

## 2018-04-06 DIAGNOSIS — J302 Other seasonal allergic rhinitis: Secondary | ICD-10-CM | POA: Diagnosis not present

## 2018-04-06 DIAGNOSIS — I10 Essential (primary) hypertension: Secondary | ICD-10-CM | POA: Diagnosis not present

## 2018-04-06 DIAGNOSIS — R1013 Epigastric pain: Secondary | ICD-10-CM | POA: Diagnosis not present

## 2018-04-06 DIAGNOSIS — R7303 Prediabetes: Secondary | ICD-10-CM | POA: Diagnosis not present

## 2018-04-13 ENCOUNTER — Encounter: Payer: Self-pay | Admitting: Gastroenterology

## 2018-04-13 ENCOUNTER — Ambulatory Visit (INDEPENDENT_AMBULATORY_CARE_PROVIDER_SITE_OTHER): Payer: Medicare Other | Admitting: Gastroenterology

## 2018-04-13 VITALS — BP 122/72 | HR 68 | Ht 64.0 in | Wt 179.1 lb

## 2018-04-13 DIAGNOSIS — K625 Hemorrhage of anus and rectum: Secondary | ICD-10-CM

## 2018-04-13 DIAGNOSIS — R1310 Dysphagia, unspecified: Secondary | ICD-10-CM | POA: Diagnosis not present

## 2018-04-13 MED ORDER — PEG 3350-KCL-NA BICARB-NACL 420 G PO SOLR: 4000.0000 mL | ORAL | 0 refills | Status: DC

## 2018-04-13 NOTE — Progress Notes (Signed)
EGD June 2017 Dr. 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  Colonoscopy 04/2010 indications "medium sized rectal mass noted on colonoscopy by Dr. Penelope Coop 2009.  She was referred to a surgeon but never went.  Colonoscopy found a small distal rectum nodule soft, I felt it was probably a papilloma or anal neoplasm and I referred her to general surgery again.   HPI: This is a very pleasant 82 year old womanwho was referred to me by Benito Mccreedy, MD  to evaluate intermittent rectal bleeding, dysphasia.    Chief complaint is intermittent rectal bleeding, dysphasia  Has minor rectal bleeding, about once per week.   This occurs only when she is moving her bowels.  She thinks it is hemorrhoid related.  She did have the rectal lesion that I documented above by colonoscopy removed by surgery and it turned out to be a thrombosed hemorrhoid.  She feels that it has returned.  During a bowel movement will protrude and will sometimes bleed.  She does not have pain with it.  Dysphagia for past few months: has to chew very well, if not then dysphagia.  does't seem progressive.  Seems to happen daily.  When the food hangs she will have to straighten up and relax swallow some water sometimes.  Weight fluctuates.  Has pyrosis.  Takes nexium daily and if she misses 2 doses in a row then symptom are severe  Not constipated at all.    Old Data Reviewed:  Labs April 2019; CBC was normal, MCV slightly elevated at 98.  Complete metabolic profile normal  I reviewed a note from [facility] dated May 2019 which states she has "iron deficiency anemia secondary to chronic blood loss."  She was on iron for that.    Review of systems: Pertinent positive and negative review of systems were noted in the above HPI section. All other review negative.   Past Medical History:  Diagnosis Date  .  Anxiety    takes Klonopin daily  as needed  . Diffuse cystic mastopathy   . GERD (gastroesophageal reflux disease)   . Headache   . History of colonoscopy   . HTN (hypertension)    takes Azor daily   . Hyperlipidemia   . Macular degeneration (senile) of retina, unspecified   . Nontoxic uninodular goiter   . Palpitations    takes Sectral nightly  . Pre-diabetes   . Thoracic aortic aneurysm ([place])   . Vertigo    takes Meclizine daily as needed    Past Surgical History:  Procedure Laterality Date  . CATARACT EXTRACTION W/PHACO Right 04/03/2015   Procedure: CATARACT EXTRACTION PHACO AND INTRAOCULAR LENS PLACEMENT (IOC);  Surgeon: Marylynn Pearson, MD;  Location: [facility];  Service: Ophthalmology;  Laterality: Right;  . ESOPHAGOGASTRODUODENOSCOPY N/A 11/28/2015   Procedure: ESOPHAGOGASTRODUODENOSCOPY (EGD);  Surgeon: Gatha Mayer, MD;  Location: [facility] ENDOSCOPY;  Service: Endoscopy;  Laterality: N/A;  . TOTAL KNEE ARTHROPLASTY    . TRANSANAL EXCISION OF RECTAL MASS  07/2010   pathology: hemorrhoid.     Current Outpatient Medications  Medication Sig Dispense Refill  . acebutolol (SECTRAL) 200 MG capsule Take 200 mg by mouth 2 (two) times daily.    . clonazePAM (KLONOPIN) 0.5 MG tablet TAKE 1 TABLET BY MOUTH TWICE A DAY AS NEEDED FOR ANXIETY 60 tablet 0  . [employer] D3 5000 UNITS capsule Take 5,000 Units by mouth once  a week.   2  . esomeprazole (NEXIUM) 40 MG capsule Take 40 mg by mouth daily at 12 noon.    . meclizine (ANTIVERT) 25 MG tablet TAKE 1 TABLET BY MOUTH 3 TIMES A DAY AS NEEDED VERTIGO 50 tablet 0  . meloxicam (MOBIC) 7.5 MG tablet Take 7.5 mg by mouth daily.  0  . Multiple Vitamins-Minerals (MULTIVITAMIN,TX-MINERALS) tablet Take 1 tablet by mouth 2 (two) times daily.     Vladimir Faster Glycol-Propyl Glycol (SYSTANE) 0.4-0.3 % SOLN Place 1 drop into the left eye 4 (four) times daily.     No current facility-administered medications for this visit.     Allergies as of 04/13/2018 - Review  Complete 04/13/2018  Allergen Reaction Noted  . Statins Other (See Comments) 08/10/2011  . Prednisone  08/28/2015  . Ibuprofen Other (See Comments) 09/12/2008    Family History  Problem Relation Age of Onset  . Coronary artery disease Other   . Heart disease Sister   . Heart disease Brother   . Colon cancer Neg Hx   . Cancer Neg Hx   . Stroke Neg Hx   . Neuropathy Neg Hx   . Multiple sclerosis Neg Hx   . Migraines Neg Hx     Social History   Socioeconomic History  . Marital status: Divorced    Spouse name: Not on file  . Number of children: 2  . Years of education: 73  . Highest education level: Not on file  Occupational History  . Not on file  Social Needs  . Financial resource strain: Not on file  . Food insecurity:    Worry: Not on file    Inability: Not on file  . Transportation needs:    Medical: Not on file    Non-medical: Not on file  Tobacco Use  . Smoking status: Never Smoker  . Smokeless tobacco: Never Used  Substance and Sexual Activity  . Alcohol use: No  . Drug use: No  . Sexual activity: Never  Lifestyle  . Physical activity:    Days per week: Not on file    Minutes per session: Not on file  . Stress: Not on file  Relationships  . Social connections:    Talks on phone: Not on file    Gets together: Not on file    Attends religious service: Not on file    Active member of club or organization: Not on file    Attends meetings of clubs or organizations: Not on file    Relationship status: Not on file  . Intimate partner violence:    Fear of current or ex partner: Not on file    Emotionally abused: Not on file    Physically abused: Not on file    Forced sexual activity: Not on file  Other Topics Concern  . Not on file  Social History Narrative   Lives with daughter   Caffeine use: none   Regular Exercise -  NO        Physical Exam: BP 122/72 (BP Location: Left Arm, Patient Position: Sitting, Cuff Size: Normal)   Pulse 68   Ht 5\' 4"   (1.626 m) Comment: height measured without shoes  Wt 179 lb 2 oz (81.3 kg)   BMI 30.75 kg/m  Constitutional: generally well-appearing Psychiatric: alert and oriented x3 Eyes: extraocular movements intact Mouth: oral pharynx moist, no lesions Neck: supple no lymphadenopathy Cardiovascular: heart regular rate and rhythm Lungs: clear to auscultation bilaterally Abdomen: soft, nontender, nondistended,  no obvious ascites, no peritoneal signs, normal bowel sounds Extremities: no lower extremity edema bilaterally Skin: no lesions on visible extremities   Assessment and plan: 82 y.o. female with minor rectal bleeding, intermittent solid food dysphagia that is not progressive, chronic GERD  First I think she has acid related issues with her esophagus.  She clearly has pyrosis that improves as long as she takes a proton pump inhibitor daily.  She may have developed peptic stricture, less likely neoplasia.  I recommended an upper endoscopy at her soonest convenience for further work-up, will dilated for benign stricture is noted.  She has minor rectal bleeding at the same time as the upper endoscopy will perform a colonoscopy.  It has been about 8 years since her last one she is having some recurrent bleeding now.  If she only has internal hemorrhoids that I would likely refer her to 1 of my partners for hemorrhoidal banding therapy    Please see the "Patient Instructions" section for addition details about the plan.   Owens Loffler, MD [facility] 04/13/2018, 11:07 AM  Cc: Benito Mccreedy, MD

## 2018-04-13 NOTE — Patient Instructions (Addendum)
You will be set up for a colonoscopy for rectal bleeding. You will be set up for an upper endoscopy for GERD, dysphagia.  You have been scheduled for a colonoscopy. Please follow written instructions given to you at your visit today.  Please pick up your prep supplies at the pharmacy within the next 1-3 days. If you use inhalers (even only as needed), please bring them with you on the day of your procedure. Your physician has requested that you go to www.startemmi.com and enter the access code given to you at your visit today. This web site gives a general overview about your procedure. However, you should still follow specific instructions given to you by our office regarding your preparation for the procedure.  Thank you for entrusting me with your care and choosing [facility].  Dr Ardis Hughs

## 2018-04-20 ENCOUNTER — Encounter: Payer: Self-pay | Admitting: Gastroenterology

## 2018-04-20 ENCOUNTER — Ambulatory Visit (AMBULATORY_SURGERY_CENTER): Payer: Medicare Other | Admitting: Gastroenterology

## 2018-04-20 VITALS — BP 130/78 | HR 51 | Temp 97.5°F | Resp 15 | Ht 64.0 in | Wt 179.0 lb

## 2018-04-20 DIAGNOSIS — K3189 Other diseases of stomach and duodenum: Secondary | ICD-10-CM

## 2018-04-20 DIAGNOSIS — K2970 Gastritis, unspecified, without bleeding: Secondary | ICD-10-CM | POA: Diagnosis not present

## 2018-04-20 DIAGNOSIS — K625 Hemorrhage of anus and rectum: Secondary | ICD-10-CM | POA: Diagnosis not present

## 2018-04-20 DIAGNOSIS — K633 Ulcer of intestine: Secondary | ICD-10-CM

## 2018-04-20 DIAGNOSIS — K2990 Gastroduodenitis, unspecified, without bleeding: Secondary | ICD-10-CM

## 2018-04-20 DIAGNOSIS — D122 Benign neoplasm of ascending colon: Secondary | ICD-10-CM

## 2018-04-20 MED ORDER — SODIUM CHLORIDE 0.9 % IV SOLN: 500.0000 mL | Freq: Once | INTRAVENOUS | Status: DC

## 2018-04-20 NOTE — Progress Notes (Signed)
Called to room to assist during endoscopic procedure.  Patient ID and intended procedure confirmed with present staff. Received instructions for my participation in the procedure from the performing physician.  

## 2018-04-20 NOTE — Op Note (Signed)
[facility] Patient Name: [name] Procedure Date: 04/20/2018 8:42 AM MRN: [id number] Endoscopist: Milus Banister , MD Age: 82 Referring MD:  Date of Birth: Jun 30, 1935 Gender: Female Account #: [id number] Procedure:                Colonoscopy Indications:              Rectal bleeding Medicines:                Monitored Anesthesia Care Procedure:                Pre-Anesthesia Assessment:                           - Prior to the procedure, a History and Physical                            was performed, and patient medications and                            allergies were reviewed. The patient's tolerance of                            previous anesthesia was also reviewed. The risks                            and benefits of the procedure and the sedation                            options and risks were discussed with the patient.                            All questions were answered, and informed consent                            was obtained. Prior Anticoagulants: The patient has                            taken no previous anticoagulant or antiplatelet                            agents. ASA Grade Assessment: II - A patient with                            mild systemic disease. After reviewing the risks                            and benefits, the patient was deemed in                            satisfactory condition to undergo the procedure.                           After obtaining informed consent, the colonoscope  was passed under direct vision. Throughout the                            procedure, the patient's blood pressure, pulse, and                            oxygen saturations were monitored continuously. The                            Colonoscope was introduced through the anus and                            advanced to the the cecum, identified by                            appendiceal orifice and ileocecal valve. The                     colonoscopy was performed without difficulty. The                            patient tolerated the procedure well. The quality                            of the bowel preparation was good. The ileocecal                            valve, appendiceal orifice, and rectum were                            photographed. Scope In: 8:56:59 AM Scope Out: 9:13:29 AM Scope Withdrawal Time: 0 hours 14 minutes 10 seconds  Total Procedure Duration: 0 hours 16 minutes 30 seconds  Findings:                 A single shallow, benign appearing six mm ulcer was                            found in the proximal ascending colon (directly                            adjacent to the IC valve). No bleeding was present.                            No stigmata of recent bleeding were seen. Biopsies                            were taken with a cold forceps for histology.                           Internal hemorrhoids were found. The hemorrhoids                            were small.  The exam was otherwise without abnormality on                            direct and retroflexion views. Complications:            No immediate complications. Estimated blood loss:                            None. Estimated Blood Loss:     Estimated blood loss: none. Impression:               - A single small, clean based ulcer in the proximal                            ascending colon. Biopsied.                           - Internal hemorrhoids.                           - The examination was otherwise normal on direct                            and retroflexion views. Recommendation:           - Patient has a contact number available for                            emergencies. The signs and symptoms of potential                            delayed complications were discussed with the                            patient. Return to normal activities tomorrow.                            Written  discharge instructions were provided to the                            patient.                           - Resume previous diet.                           - Continue present medications.                           - Await pathology results. Milus Banister, MD 04/20/2018 9:24:13 AM This report has been signed electronically.

## 2018-04-20 NOTE — Patient Instructions (Signed)
Handouts:  Gastritis  YOU HAD AN ENDOSCOPIC PROCEDURE TODAY AT [facility]:   Refer to the procedure report that was given to you for any specific questions about what was found during the examination.  If the procedure report does not answer your questions, please call your gastroenterologist to clarify.  If you requested that your care partner not be given the details of your procedure findings, then the procedure report has been included in a sealed envelope for you to review at your convenience later.  YOU SHOULD EXPECT: Some feelings of bloating in the abdomen. Passage of more gas than usual.  Walking can help get rid of the air that was put into your GI tract during the procedure and reduce the bloating. If you had a lower endoscopy (such as a colonoscopy or flexible sigmoidoscopy) you may notice spotting of blood in your stool or on the toilet paper. If you underwent a bowel prep for your procedure, you may not have a normal bowel movement for a few days.  Please Note:  You might notice some irritation and congestion in your nose or some drainage.  This is from the oxygen used during your procedure.  There is no need for concern and it should clear up in a day or so.  SYMPTOMS TO REPORT IMMEDIATELY:   Following lower endoscopy (colonoscopy or flexible sigmoidoscopy):  Excessive amounts of blood in the stool  Significant tenderness or worsening of abdominal pains  Swelling of the abdomen that is new, acute  Fever of 100F or higher   Following upper endoscopy (EGD)  Vomiting of blood or coffee ground material  New chest pain or pain under the shoulder blades  Painful or persistently difficult swallowing  New shortness of breath  Fever of 100F or higher  Black, tarry-looking stools  For urgent or emergent issues, a gastroenterologist can be reached at any hour by calling [phone].   DIET:  We do recommend a small meal at first, but then you may proceed to  your regular diet.  Drink plenty of fluids but you should avoid alcoholic beverages for 24 hours.  ACTIVITY:  You should plan to take it easy for the rest of today and you should NOT DRIVE or use heavy machinery until tomorrow (because of the sedation medicines used during the test).    FOLLOW UP: Our staff will call the number listed on your records the next business day following your procedure to check on you and address any questions or concerns that you may have regarding the information given to you following your procedure. If we do not reach you, we will leave a message.  However, if you are feeling well and you are not experiencing any problems, there is no need to return our call.  We will assume that you have returned to your regular daily activities without incident.  If any biopsies were taken you will be contacted by phone or by letter within the next 1-3 weeks.  Please call us at [phone] if you have not heard about the biopsies in 3 weeks.    SIGNATURES/CONFIDENTIALITY: You and/or your care partner have signed paperwork which will be entered into your electronic medical record.  These signatures attest to the fact that that the information above on your After Visit Summary has been reviewed and is understood.  Full responsibility of the confidentiality of this discharge information lies with you and/or your care-partner.

## 2018-04-20 NOTE — Progress Notes (Signed)
Report given to PACU, vss 

## 2018-04-20 NOTE — Op Note (Signed)
[facility] Patient Name: [name] Procedure Date: 04/20/2018 8:43 AM MRN: [id number] Endoscopist: Milus Banister , MD Age: 82 Referring MD:  Date of Birth: 09-16-1935 Gender: Female Account #: [id number] Procedure:                Upper GI endoscopy Indications:              Dysphagia Medicines:                Monitored Anesthesia Care Procedure:                Pre-Anesthesia Assessment:                           - Prior to the procedure, a History and Physical                            was performed, and patient medications and                            allergies were reviewed. The patient's tolerance of                            previous anesthesia was also reviewed. The risks                            and benefits of the procedure and the sedation                            options and risks were discussed with the patient.                            All questions were answered, and informed consent                            was obtained. Prior Anticoagulants: The patient has                            taken no previous anticoagulant or antiplatelet                            agents. ASA Grade Assessment: II - A patient with                            mild systemic disease. After reviewing the risks                            and benefits, the patient was deemed in                            satisfactory condition to undergo the procedure.                           - Prior to the procedure, a History and Physical  was performed, and patient medications and                            allergies were reviewed. The patient's tolerance of                            previous anesthesia was also reviewed. The risks                            and benefits of the procedure and the sedation                            options and risks were discussed with the patient.                            All questions were answered, and informed consent                            was obtained. Prior Anticoagulants: The patient has                            taken no previous anticoagulant or antiplatelet                            agents. ASA Grade Assessment: II - A patient with                            mild systemic disease. After reviewing the risks                            and benefits, the patient was deemed in                            satisfactory condition to undergo the procedure.                           After obtaining informed consent, the endoscope was                            passed under direct vision. Throughout the                            procedure, the patient's blood pressure, pulse, and                            oxygen saturations were monitored continuously. The                            Endoscope was introduced through the mouth, and                            advanced to the second part of duodenum. The upper  GI endoscopy was accomplished without difficulty.                            The patient tolerated the procedure well. Scope In: Scope Out: Findings:                 All three of the previously placed (11/2015 EGD Dr.                            Carlean Purl) endoclips were still in place in the                            gastric body.                           Mild inflammation characterized by congestion                            (edema) and erythema was found in the gastric                            antrum. Biopsies were taken with a cold forceps for                            histology.                           The GE junction was normal.                           Mildly tortuous mid esophagus.                           The exam was otherwise without abnormality. Complications:            No immediate complications. Estimated blood loss:                            None. Estimated Blood Loss:     Estimated blood loss: none. Impression:               - Mild gatritis,  biopsied.                           - All three of the previously placed (11/2015 EGD                            Dr. Carlean Purl) endoclips were still in place in the                            gastric body.                           - The GE junction was normal.                           - Mildly tortuous mid esophagus.                           -  The examination was otherwise normal. Recommendation:           - Patient has a contact number available for                            emergencies. The signs and symptoms of potential                            delayed complications were discussed with the                            patient. Return to normal activities tomorrow.                            Written discharge instructions were provided to the                            patient.                           - Resume previous diet. Chew your food well, eat                            slowly and take small bites.                           - Continue present medications.                           - Await pathology results. Milus Banister, MD 04/20/2018 9:28:02 AM This report has been signed electronically.

## 2018-04-21 ENCOUNTER — Telehealth: Payer: Self-pay

## 2018-04-21 NOTE — Telephone Encounter (Signed)
  Follow up Call-  Call back number 04/20/2018  Post procedure Call Back phone  # [phone]  Permission to leave phone message Yes  Some recent data might be hidden     Patient questions:  Do you have a fever, pain , or abdominal swelling? No. Pain Score  0 *  Have you tolerated food without any problems? Yes.    Have you been able to return to your normal activities? Yes.    Do you have any questions about your discharge instructions: Diet   No. Medications  No. Follow up visit  No.  Do you have questions or concerns about your Care? Yes.  Pt.'s daughter said pt. Had a runny nose and cough following the procedure.  Told daughter that can happen as a result of the oxygen used in the procedure, and should be improved today, but to call back if they have any further questions or concerns.  Actions: * If pain score is 4 or above: No action needed, pain <4.

## 2018-05-04 ENCOUNTER — Telehealth: Payer: Self-pay | Admitting: Gastroenterology

## 2018-05-04 NOTE — Telephone Encounter (Signed)
Dr Ardis Hughs the pt is returning your call regarding path results

## 2018-05-04 NOTE — Telephone Encounter (Signed)
Patient returning Dr.Jacobs call about path results from 10.30.19.

## 2018-05-09 ENCOUNTER — Telehealth: Payer: Self-pay | Admitting: Gastroenterology

## 2018-05-09 NOTE — Telephone Encounter (Signed)
See alternate results note.  

## 2018-05-09 NOTE — Telephone Encounter (Signed)
Dr Ardis Hughs see note Dr Henderson Newcomer would like you to call to discuss pt.  [phone]

## 2018-05-09 NOTE — Telephone Encounter (Signed)
I spoke with him.  He is her aunt and she asked him to call me to get more information about the area in her colon.  We had a nice discussion and I answered all his questions.

## 2018-05-13 ENCOUNTER — Ambulatory Visit (AMBULATORY_SURGERY_CENTER): Payer: Self-pay

## 2018-05-13 VITALS — Ht 64.0 in | Wt 181.2 lb

## 2018-05-13 DIAGNOSIS — K625 Hemorrhage of anus and rectum: Secondary | ICD-10-CM

## 2018-05-13 MED ORDER — PEG 3350-KCL-NA BICARB-NACL 420 G PO SOLR: 4000.0000 mL | Freq: Once | ORAL | 0 refills | Status: AC

## 2018-05-13 NOTE — Progress Notes (Signed)
Denies allergies to eggs or soy products. Denies complication of anesthesia or sedation. Denies use of weight loss medication. Denies use of O2.   Emmi instructions declined.  

## 2018-05-23 ENCOUNTER — Encounter: Payer: Medicare Other | Admitting: Gastroenterology

## 2018-05-25 DIAGNOSIS — R0789 Other chest pain: Secondary | ICD-10-CM | POA: Diagnosis not present

## 2018-05-25 DIAGNOSIS — N644 Mastodynia: Secondary | ICD-10-CM | POA: Diagnosis not present

## 2018-05-25 DIAGNOSIS — R1013 Epigastric pain: Secondary | ICD-10-CM | POA: Diagnosis not present

## 2018-05-25 DIAGNOSIS — I10 Essential (primary) hypertension: Secondary | ICD-10-CM | POA: Diagnosis not present

## 2018-05-25 DIAGNOSIS — R7303 Prediabetes: Secondary | ICD-10-CM | POA: Diagnosis not present

## 2018-05-27 ENCOUNTER — Encounter: Payer: Self-pay | Admitting: Gastroenterology

## 2018-05-27 ENCOUNTER — Ambulatory Visit (AMBULATORY_SURGERY_CENTER): Payer: Medicare Other | Admitting: Gastroenterology

## 2018-05-27 VITALS — BP 110/66 | HR 72 | Temp 98.2°F | Resp 13 | Ht 64.0 in | Wt 181.0 lb

## 2018-05-27 DIAGNOSIS — K625 Hemorrhage of anus and rectum: Secondary | ICD-10-CM | POA: Diagnosis not present

## 2018-05-27 DIAGNOSIS — K5289 Other specified noninfective gastroenteritis and colitis: Secondary | ICD-10-CM

## 2018-05-27 DIAGNOSIS — K639 Disease of intestine, unspecified: Secondary | ICD-10-CM

## 2018-05-27 DIAGNOSIS — K649 Unspecified hemorrhoids: Secondary | ICD-10-CM

## 2018-05-27 DIAGNOSIS — K51911 Ulcerative colitis, unspecified with rectal bleeding: Secondary | ICD-10-CM | POA: Diagnosis not present

## 2018-05-27 MED ORDER — SODIUM CHLORIDE 0.9 % IV SOLN: 500.0000 mL | Freq: Once | INTRAVENOUS | Status: DC

## 2018-05-27 NOTE — Patient Instructions (Signed)
AVOID NSAIDS (MOTRIN,IBUPROFEN, ADVIL, MOBIC , MELOXICAM, ALEVE, NAPROSYN ETC.)  HANDOUT GIVEN : HEMORRHOIDS   YOU HAD AN ENDOSCOPIC PROCEDURE TODAY AT [facility]:   Refer to the procedure report that was given to you for any specific questions about what was found during the examination.  If the procedure report does not answer your questions, please call your gastroenterologist to clarify.  If you requested that your care partner not be given the details of your procedure findings, then the procedure report has been included in a sealed envelope for you to review at your convenience later.  YOU SHOULD EXPECT: Some feelings of bloating in the abdomen. Passage of more gas than usual.  Walking can help get rid of the air that was put into your GI tract during the procedure and reduce the bloating. If you had a lower endoscopy (such as a colonoscopy or flexible sigmoidoscopy) you may notice spotting of blood in your stool or on the toilet paper. If you underwent a bowel prep for your procedure, you may not have a normal bowel movement for a few days.  Please Note:  You might notice some irritation and congestion in your nose or some drainage.  This is from the oxygen used during your procedure.  There is no need for concern and it should clear up in a day or so.  SYMPTOMS TO REPORT IMMEDIATELY:   Following lower endoscopy (colonoscopy or flexible sigmoidoscopy):  Excessive amounts of blood in the stool  Significant tenderness or worsening of abdominal pains  Swelling of the abdomen that is new, acute  Fever of 100F or higher   For urgent or emergent issues, a gastroenterologist can be reached at any hour by calling [phone].   DIET:  We do recommend a small meal at first, but then you may proceed to your regular diet.  Drink plenty of fluids but you should avoid alcoholic beverages for 24 hours.  ACTIVITY:  You should plan to take it easy for the rest of today and you  should NOT DRIVE or use heavy machinery until tomorrow (because of the sedation medicines used during the test).    FOLLOW UP: Our staff will call the number listed on your records the next business day following your procedure to check on you and address any questions or concerns that you may have regarding the information given to you following your procedure. If we do not reach you, we will leave a message.  However, if you are feeling well and you are not experiencing any problems, there is no need to return our call.  We will assume that you have returned to your regular daily activities without incident.  If any biopsies were taken you will be contacted by phone or by letter within the next 1-3 weeks.  Please call us at [phone] if you have not heard about the biopsies in 3 weeks.    SIGNATURES/CONFIDENTIALITY: You and/or your care partner have signed paperwork which will be entered into your electronic medical record.  These signatures attest to the fact that that the information above on your After Visit Summary has been reviewed and is understood.  Full responsibility of the confidentiality of this discharge information lies with you and/or your care-partner.

## 2018-05-27 NOTE — Progress Notes (Signed)
Called to room to assist during endoscopic procedure.  Patient ID and intended procedure confirmed with present staff. Received instructions for my participation in the procedure from the performing physician.  

## 2018-05-27 NOTE — Progress Notes (Signed)
PT taken to PACU. Monitors in place. VSS. Report given to RN. 

## 2018-05-27 NOTE — Op Note (Signed)
[facility] Patient Name: [name] Procedure Date: 05/27/2018 9:58 AM MRN: [id number] Endoscopist: Milus Banister , MD Age: 82 Referring MD:  Date of Birth: 08/30/1935 Gender: Female Account #: [id number] Procedure:                Colonoscopy Indications:              High risk colon cancer surveillance: Personal                            history of colonic polyps; Colonoscopy 2 months ago                            found isolated ascending colon ulcer, biopsies                            suggested adenomatous change. Medicines:                Monitored Anesthesia Care Procedure:                Pre-Anesthesia Assessment:                           - Prior to the procedure, a History and Physical                            was performed, and patient medications and                            allergies were reviewed. The patient's tolerance of                            previous anesthesia was also reviewed. The risks                            and benefits of the procedure and the sedation                            options and risks were discussed with the patient.                            All questions were answered, and informed consent                            was obtained. Prior Anticoagulants: The patient has                            taken no previous anticoagulant or antiplatelet                            agents. ASA Grade Assessment: II - A patient with                            mild systemic disease. After reviewing the risks  and benefits, the patient was deemed in                            satisfactory condition to undergo the procedure.                           After obtaining informed consent, the colonoscope                            was passed under direct vision. Throughout the                            procedure, the patient's blood pressure, pulse, and                            oxygen saturations were monitored  continuously. The                            Colonoscope was introduced through the anus and                            advanced to the the terminal ileum. The colonoscopy                            was performed without difficulty. The patient                            tolerated the procedure well. The quality of the                            bowel preparation was good. The terminal ileum,                            ileocecal valve, appendiceal orifice, and rectum                            were photographed. Scope In: 10:02:43 AM Scope Out: 10:14:41 AM Scope Withdrawal Time: 0 hours 9 minutes 56 seconds  Total Procedure Duration: 0 hours 11 minutes 58 seconds  Findings:                 The terminal ileum appeared normal.                           There were several areas of focal ulceration,                            erosion throughout the right colon (ascending and                            cecum). This was a clear change from examination 2                            months ago. Biopsies were taken with a cold forceps  for histology (jar 1)                           The left colon mucosa was essentially normal                            appearing, biospies taken (jar 2)                           Medium sized internal and external hemorrhoids.                           The exam was otherwise without abnormality on                            direct and retroflexion views. Complications:            No immediate complications. Estimated blood loss:                            None. Estimated Blood Loss:     Estimated blood loss: none. Impression:               - Normal terminal ileum.                           - There were several areas of focal ulceration,                            erosion throughout the right colon (ascending and                            cecum). This was a clear change from examination 2                            months ago. Biopsies  were taken with a cold forceps                            for histology (jar 1)                           The left colon mucosa was essentially normal                            appearing, biospies taken (jar 2)                           - Medium sized internal and external hemorrhoids.                           - The examination was otherwise normal on direct                            and retroflexion views. Recommendation:           - Patient has a contact number available for  emergencies. The signs and symptoms of potential                            delayed complications were discussed with the                            patient. Return to normal activities tomorrow.                            Written discharge instructions were provided to the                            patient.                           - Resume previous diet.                           - Continue present medications.                           - Await pathology results. The scattered ulcers,                            erosions in your colon may be a signs of IBD                            (Crohn's disease) or perhaps they are damage from                            NSAIDS (such as your mobic). Milus Banister, MD 05/27/2018 10:22:36 AM This report has been signed electronically.

## 2018-05-30 ENCOUNTER — Telehealth: Payer: Self-pay | Admitting: *Deleted

## 2018-05-30 NOTE — Telephone Encounter (Signed)
First follow up call attempt.  Reached voicemail with another name.  No message left.

## 2018-05-30 NOTE — Telephone Encounter (Signed)
  Follow up Call-  Call back number 05/27/2018 04/20/2018  Post procedure Call Back phone  # [phone]  Permission to leave phone message Yes Yes  Some recent data might be hidden     Patient questions:  Do you have a fever, pain , or abdominal swelling? No. Pain Score  0 *  Have you tolerated food without any problems? Yes.    Have you been able to return to your normal activities? Yes.    Do you have any questions about your discharge instructions: Diet   No. Medications  No. Follow up visit  No.  Do you have questions or concerns about your Care? No.  Actions: * If pain score is 4 or above: No action needed, pain <4.

## 2018-05-31 ENCOUNTER — Other Ambulatory Visit: Payer: Self-pay | Admitting: Physician Assistant

## 2018-05-31 DIAGNOSIS — R0789 Other chest pain: Secondary | ICD-10-CM

## 2018-06-08 DIAGNOSIS — R52 Pain, unspecified: Secondary | ICD-10-CM | POA: Diagnosis not present

## 2018-06-08 DIAGNOSIS — Z131 Encounter for screening for diabetes mellitus: Secondary | ICD-10-CM | POA: Diagnosis not present

## 2018-06-08 DIAGNOSIS — R1013 Epigastric pain: Secondary | ICD-10-CM | POA: Diagnosis not present

## 2018-06-08 DIAGNOSIS — I10 Essential (primary) hypertension: Secondary | ICD-10-CM | POA: Diagnosis not present

## 2018-06-08 DIAGNOSIS — E785 Hyperlipidemia, unspecified: Secondary | ICD-10-CM | POA: Diagnosis not present

## 2018-06-08 DIAGNOSIS — R7303 Prediabetes: Secondary | ICD-10-CM | POA: Diagnosis not present

## 2018-06-23 ENCOUNTER — Other Ambulatory Visit: Payer: Medicare Other

## 2018-06-28 ENCOUNTER — Inpatient Hospital Stay: Admission: RE | Admit: 2018-06-28 | Payer: Medicare Other | Source: Ambulatory Visit

## 2018-06-29 DIAGNOSIS — R7303 Prediabetes: Secondary | ICD-10-CM | POA: Diagnosis not present

## 2018-06-29 DIAGNOSIS — R1013 Epigastric pain: Secondary | ICD-10-CM | POA: Diagnosis not present

## 2018-06-29 DIAGNOSIS — M5442 Lumbago with sciatica, left side: Secondary | ICD-10-CM | POA: Diagnosis not present

## 2018-06-29 DIAGNOSIS — I10 Essential (primary) hypertension: Secondary | ICD-10-CM | POA: Diagnosis not present

## 2018-06-29 DIAGNOSIS — E785 Hyperlipidemia, unspecified: Secondary | ICD-10-CM | POA: Diagnosis not present

## 2018-07-06 DIAGNOSIS — I10 Essential (primary) hypertension: Secondary | ICD-10-CM | POA: Diagnosis not present

## 2018-07-06 DIAGNOSIS — R1013 Epigastric pain: Secondary | ICD-10-CM | POA: Diagnosis not present

## 2018-07-06 DIAGNOSIS — D500 Iron deficiency anemia secondary to blood loss (chronic): Secondary | ICD-10-CM | POA: Diagnosis not present

## 2018-07-06 DIAGNOSIS — Z0110 Encounter for examination of ears and hearing without abnormal findings: Secondary | ICD-10-CM | POA: Diagnosis not present

## 2018-07-06 DIAGNOSIS — R7303 Prediabetes: Secondary | ICD-10-CM | POA: Diagnosis not present

## 2018-07-06 DIAGNOSIS — M19072 Primary osteoarthritis, left ankle and foot: Secondary | ICD-10-CM | POA: Diagnosis not present

## 2018-07-06 DIAGNOSIS — E538 Deficiency of other specified B group vitamins: Secondary | ICD-10-CM | POA: Diagnosis not present

## 2018-07-08 DIAGNOSIS — H04123 Dry eye syndrome of bilateral lacrimal glands: Secondary | ICD-10-CM | POA: Diagnosis not present

## 2018-07-19 ENCOUNTER — Ambulatory Visit (INDEPENDENT_AMBULATORY_CARE_PROVIDER_SITE_OTHER): Payer: Medicare Other | Admitting: Family Medicine

## 2018-07-22 ENCOUNTER — Ambulatory Visit (INDEPENDENT_AMBULATORY_CARE_PROVIDER_SITE_OTHER): Payer: Medicare Other | Admitting: Family Medicine

## 2018-07-22 ENCOUNTER — Encounter (INDEPENDENT_AMBULATORY_CARE_PROVIDER_SITE_OTHER): Payer: Self-pay | Admitting: Family Medicine

## 2018-07-22 DIAGNOSIS — M545 Low back pain: Secondary | ICD-10-CM

## 2018-07-22 DIAGNOSIS — G8929 Other chronic pain: Secondary | ICD-10-CM | POA: Diagnosis not present

## 2018-07-22 DIAGNOSIS — M19072 Primary osteoarthritis, left ankle and foot: Secondary | ICD-10-CM | POA: Diagnosis not present

## 2018-07-22 DIAGNOSIS — M25561 Pain in right knee: Secondary | ICD-10-CM | POA: Diagnosis not present

## 2018-07-22 MED ORDER — METHOCARBAMOL 500 MG PO TABS: 500.0000 mg | ORAL_TABLET | Freq: Every evening | ORAL | 1 refills | Status: DC | PRN

## 2018-07-22 NOTE — Progress Notes (Signed)
I saw and examined the patient with Dr. Okey Dupre and agree with assessment and plan as outlined.  Hinged right knee brace, PT for back/knee/ankle.  Cautious use of robaxin at bed time.    X-rays of right knee if symptoms persist.  Lumbar injection a possible future option.

## 2018-07-22 NOTE — Progress Notes (Signed)
  [name] - 83 y.o. female MRN [id number]  Date of birth: 1935-07-14    SUBJECTIVE:      Chief Complaint: Back pain  HPI:  83 year old female with bilateral lower back pain for about 1 month.  She is here today with her daughter patient denies any specific injury.  Pain began somewhat suddenly.  She localizes pain to the back bilaterally.  She does acknowledge some radiation of pain into the lateral thigh.  Does not go below the knee.  Her back pain is made worse by repetitive movement, such as vacuuming.  It is not particularly worse with flexion or extension.  The symptoms in her lateral thigh occur with walking.  Of note, she is status post right TKA approximately 20 years ago and has been having right knee pain.  Her thigh pain predates the onset of her back pain and she believes it may be related to her knee.  She denies any focal erythema or swelling in her back or leg.  No distal numbness or tingling.  She does report occasional feeling of instability in the knee. She has been taking Tylenol, Tylenol No. 3, and meloxicam without significant benefit.  She states that no NSAIDs because GI upset.  She is also tried salon poss patches without benefit  ROS:     See HPI  PERTINENT  PMH / [place] [facility] / / SH:  Past Medical, Surgical, Social, and Family History Reviewed & Updated in the EMR.  Pertinent findings include:  Right TKA 20 years ago-surgery done in [place]: There were no vitals taken for this visit.  Physical Exam:  Vital signs are reviewed.  GEN: Alert and oriented, NAD Pulm: Breathing unlabored PSY: normal mood, congruent affect  MSK: Lumbar spine: - Inspection: no gross deformity or asymmetry, swelling or ecchymosis - Palpation: Bilateral tenderness around lumbosacral region - ROM: Normal extension, slightly limited flexion but without pain - Strength: 5/5 strength of lower extremity in L4-S1 nerve root distributions b/l - Neuro: sensation intact in the L4-S1  nerve root distribution b/l, 2+ L4 and S1 reflexes - Special testing: Negative straight leg raise,    Right knee: - Inspection: no gross deformity. No swelling/effusion, erythema or bruising. Skin intact - Palpation: Mild diffuse tenderness - ROM: full active ROM with flexion and extension in knee and hip without pain - Strength: 5/5 strength - Neuro/vasc: NV intact She does have slight medial and lateral laxity with varus and valgus stress testing compared to the left  Hips: Symmetric IR/ER without pain.  No tenderness over the greater trochanter    ASSESSMENT & PLAN:  1.  Low back pain 2.  Right knee pain - Overall suspect her back pain is mechanical.  The lateral leg pain she is having and I suspect is neurogenic.  She does have some instability of her right knee which is status post TKA.  This in combination with pain in her contralateral ankle have likely resulted in further mechanical issues. -We will fit her with a hinged knee brace to provide stability - Robaxin 500 mg nightly -Continue Tylenol - We will refer to physical therapy to focus on strengthening -Follow-up in 6 weeks as needed.  If she continues to have issues or pain with her knee, may consider further evaluation for revision.

## 2018-08-12 DIAGNOSIS — M6281 Muscle weakness (generalized): Secondary | ICD-10-CM | POA: Diagnosis not present

## 2018-08-12 DIAGNOSIS — M25572 Pain in left ankle and joints of left foot: Secondary | ICD-10-CM | POA: Diagnosis not present

## 2018-08-12 DIAGNOSIS — M545 Low back pain: Secondary | ICD-10-CM | POA: Diagnosis not present

## 2018-08-12 DIAGNOSIS — M25561 Pain in right knee: Secondary | ICD-10-CM | POA: Diagnosis not present

## 2018-08-15 ENCOUNTER — Other Ambulatory Visit: Payer: Self-pay | Admitting: Cardiology

## 2018-08-15 DIAGNOSIS — I712 Thoracic aortic aneurysm, without rupture, unspecified: Secondary | ICD-10-CM

## 2018-08-15 DIAGNOSIS — I10 Essential (primary) hypertension: Secondary | ICD-10-CM | POA: Diagnosis not present

## 2018-08-15 DIAGNOSIS — R002 Palpitations: Secondary | ICD-10-CM

## 2018-08-15 DIAGNOSIS — E785 Hyperlipidemia, unspecified: Secondary | ICD-10-CM | POA: Diagnosis not present

## 2018-08-15 DIAGNOSIS — Z0001 Encounter for general adult medical examination with abnormal findings: Secondary | ICD-10-CM | POA: Diagnosis not present

## 2018-08-15 DIAGNOSIS — I739 Peripheral vascular disease, unspecified: Secondary | ICD-10-CM | POA: Diagnosis not present

## 2018-08-15 DIAGNOSIS — Z1159 Encounter for screening for other viral diseases: Secondary | ICD-10-CM | POA: Diagnosis not present

## 2018-08-19 DIAGNOSIS — M6281 Muscle weakness (generalized): Secondary | ICD-10-CM | POA: Diagnosis not present

## 2018-08-19 DIAGNOSIS — M545 Low back pain: Secondary | ICD-10-CM | POA: Diagnosis not present

## 2018-08-19 DIAGNOSIS — M25561 Pain in right knee: Secondary | ICD-10-CM | POA: Diagnosis not present

## 2018-08-19 DIAGNOSIS — M25572 Pain in left ankle and joints of left foot: Secondary | ICD-10-CM | POA: Diagnosis not present

## 2018-09-28 ENCOUNTER — Other Ambulatory Visit: Payer: Medicare Other

## 2018-10-06 ENCOUNTER — Ambulatory Visit (INDEPENDENT_AMBULATORY_CARE_PROVIDER_SITE_OTHER): Payer: Medicare Other | Admitting: Cardiology

## 2018-10-06 ENCOUNTER — Other Ambulatory Visit: Payer: Self-pay

## 2018-10-06 ENCOUNTER — Encounter: Payer: Self-pay | Admitting: Cardiology

## 2018-10-06 VITALS — BP 122/77 | HR 70 | Ht 64.0 in | Wt 176.0 lb

## 2018-10-06 DIAGNOSIS — R002 Palpitations: Secondary | ICD-10-CM

## 2018-10-06 DIAGNOSIS — I712 Thoracic aortic aneurysm, without rupture: Secondary | ICD-10-CM

## 2018-10-06 DIAGNOSIS — I7121 Aneurysm of the ascending aorta, without rupture: Secondary | ICD-10-CM

## 2018-10-06 DIAGNOSIS — I10 Essential (primary) hypertension: Secondary | ICD-10-CM

## 2018-10-06 NOTE — Progress Notes (Addendum)
Subjective:  Primary Physician/Referring:  Tsosie Billing, MD  Patient ID: [name], female    DOB: August 23, 1935, 83 y.o.   MRN: [id number]  Chief Complaint  Patient presents with  . Palpitations    1 year f/u     HPI: [name]  is a 83 y.o. female  with  Hypertension, hyperglycemia, chronic palpitations and event and Holter monitor in 2014 and 2019 revealing occasional PAC and PVC, hypertension, hyperlipidemia, asymptomatic aortic aneurysm of moderate size. She has multiple medication intolerances including statins.  She has not had any chest pain or shortness of breath. Patient has known history of thoracic aortic aneurysm incidentally detected by CT scan of the chest performed in 2014 at 4.7 cm and has remained stable by last CTA in 2018. This is her annual visit. Due to COVID 19, seeing her virtually.   States that since being on acebutolol, symptoms of palpitations are essentially resolved and blood pressure is also very well controlled.  She has not had any chest pain and states that she is doing well.  Past Medical History:  Diagnosis Date  . Allergy   . Anemia   . Anxiety    takes Klonopin daily  as needed  . Arthritis   . Blood transfusion without reported diagnosis   . Cataract   . Diffuse cystic mastopathy   . GERD (gastroesophageal reflux disease)   . Headache   . History of colonoscopy   . Hyperlipidemia   . Macular degeneration (senile) of retina, unspecified   . Nontoxic uninodular goiter   . Palpitations    takes Sectral nightly  . Pre-diabetes   . Sickle cell trait ([place])   . Vertigo    takes Meclizine daily as needed    Past Surgical History:  Procedure Laterality Date  . CATARACT EXTRACTION W/PHACO Right 04/03/2015   Procedure: CATARACT EXTRACTION PHACO AND INTRAOCULAR LENS PLACEMENT (IOC);  Surgeon: Marylynn Pearson, MD;  Location: [facility];  Service: Ophthalmology;  Laterality: Right;  . ESOPHAGOGASTRODUODENOSCOPY N/A 11/28/2015   Procedure: ESOPHAGOGASTRODUODENOSCOPY (EGD);  Surgeon: Gatha Mayer, MD;  Location: [facility] ENDOSCOPY;  Service: Endoscopy;  Laterality: N/A;  . TOTAL KNEE ARTHROPLASTY    . TRANSANAL EXCISION OF RECTAL MASS  07/2010   pathology: hemorrhoid.     Social History   Socioeconomic History  . Marital status: Divorced    Spouse name: Not on file  . Number of children: 2  . Years of education: 30  . Highest education level: Not on file  Occupational History  . Not on file  Social Needs  . Financial resource strain: Not on file  . Food insecurity:    Worry: Not on file    Inability: Not on file  . Transportation needs:    Medical: Not on file    Non-medical: Not on file  Tobacco Use  . Smoking status: Never Smoker  . Smokeless tobacco: Never Used  Substance and Sexual Activity  . Alcohol use: No  . Drug use: No  . Sexual activity: Never  Lifestyle  . Physical activity:    Days per week: Not on file    Minutes per session: Not on file  . Stress: Not on file  Relationships  . Social connections:    Talks on phone: Not on file    Gets together: Not on file    Attends religious service: Not on file    Active member of club or organization: Not on file  Attends meetings of clubs or organizations: Not on file    Relationship status: Not on file  . Intimate partner violence:    Fear of current or ex partner: Not on file    Emotionally abused: Not on file    Physically abused: Not on file    Forced sexual activity: Not on file  Other Topics Concern  . Not on file  Social History Narrative   Lives with daughter   Caffeine use: none   Regular Exercise -  NO       Current Outpatient Medications on File Prior to Visit  Medication Sig Dispense Refill  . acebutolol (SECTRAL) 200 MG capsule Take 200 mg by mouth 2 (two) times daily.    Marland Kitchen amlodipine-olmesartan (AZOR) 10-20 MG tablet     . clonazePAM (KLONOPIN) 0.5 MG tablet TAKE 1 TABLET BY MOUTH TWICE A DAY AS NEEDED FOR ANXIETY 60  tablet 0  . [employer] D3 5000 UNITS capsule Take 5,000 Units by mouth once a week.   2  . esomeprazole (NEXIUM) 40 MG capsule Take 40 mg by mouth daily at 12 noon.    . meclizine (ANTIVERT) 25 MG tablet TAKE 1 TABLET BY MOUTH 3 TIMES A DAY AS NEEDED VERTIGO 50 tablet 0  . Multiple Vitamins-Minerals (ICAPS AREDS 2 PO) Take 2 capsules by mouth daily.    Marland Kitchen PROAIR HFA 108 (90 Base) MCG/ACT inhaler TAKE 2 PUFFS BY MOUTH 4 TIMES A DAY     No current facility-administered medications on file prior to visit.     Review of Systems  Constitution: Negative for chills, decreased appetite, malaise/fatigue and weight gain.  Cardiovascular: Positive for irregular heartbeat and leg swelling. Negative for dyspnea on exertion and syncope.  Endocrine: Negative for cold intolerance.  Hematologic/Lymphatic: Does not bruise/bleed easily.  Musculoskeletal: Negative for joint swelling.  Gastrointestinal: Positive for heartburn. Negative for abdominal pain, anorexia and change in bowel habit.  Neurological: Negative for headaches and light-headedness.  Psychiatric/Behavioral: Negative for depression and substance abuse.  All other systems reviewed and are negative.     Objective:  Blood pressure 122/77, pulse 70, height _0  (1.626 m), weight 176 lb (79.8 kg). Body mass index is 30.21 kg/m. As it is a virtual visit, physical examination was not performed.  Prior examination is as below.  Physical Exam  Constitutional: She appears well-developed and well-nourished. No distress.  HENT:  Head: Atraumatic.  Eyes: Conjunctivae are normal.  Neck: Neck supple. No JVD present. No thyromegaly present.  Cardiovascular: Normal rate, regular rhythm, S1 normal, S2 normal and intact distal pulses. Exam reveals no gallop.  Murmur heard.  Harsh early systolic murmur is present with a grade of 2/6 at the upper right sternal border radiating to the neck. High-pitched blowing decrescendo early diastolic murmur is present with a  grade of 2/6 at the upper right sternal border radiating to the apex. II/VI SEM in the right parasternal border  Pulmonary/Chest: Effort normal and breath sounds normal.  Abdominal: Soft. Bowel sounds are normal.  Musculoskeletal: Normal range of motion.        General: No edema.  Neurological: She is alert.  Skin: Skin is warm and dry.  Psychiatric: She has a normal mood and affect.    Radiology: No results found.  Laboratory Examination: 06/29/2017: Creatinine 0.74, EGFR 89, potassium 3.9, CMP normal.  RBC 3.68, normal H&H, CBC otherwise normal.  CMP Latest Ref Rng & Units 08/07/2017 11/27/2015 11/27/2015  Glucose 65 - 99 mg/dL 109(H)  90 101(H)  BUN 6 - 20 mg/dL _0 Creatinine 0.44 - 1.00 mg/dL 0.79 0.65 0.77  Sodium 135 - 145 mmol/L 137 139 140  Potassium 3.5 - 5.1 mmol/L 3.9 3.8 4.0  Chloride 101 - 111 mmol/L 104 111 111  CO2 22 - 32 mmol/L _1 Calcium 8.9 - 10.3 mg/dL 9.5 8.6(L) 8.8(L)  Total Protein 6.0 - 8.5 g/dL - - -  Total Bilirubin 0.0 - 1.2 mg/dL - - -  Alkaline Phos 39 - 117 IU/L - - -  AST 0 - 40 IU/L - - -  ALT 0 - 32 IU/L - - -   CBC Latest Ref Rng & Units 08/07/2017 11/30/2015 11/29/2015  WBC 4.0 - 10.5 K/uL 6.3 5.3 5.2  Hemoglobin 12.0 - 15.0 g/dL 12.6 8.7(L) 8.8(L)  Hematocrit 36.0 - 46.0 % 36.4 25.9(L) 26.2(L)  Platelets 150 - 400 K/uL 192 182 164   Lipid Panel     Component Value Date/Time   CHOL 203 (H) 08/28/2015 1604   TRIG 172 (H) 08/28/2015 1604   HDL 57 08/28/2015 1604   CHOLHDL 3.6 08/28/2015 1604   CHOLHDL 3 06/24/2011 0911   VLDL 17.0 06/24/2011 0911   LDLCALC 112 (H) 08/28/2015 1604   LDLDIRECT 139.2 04/15/2011 0908   HEMOGLOBIN A1C Lab Results  Component Value Date   HGBA1C 5.7 (H) 08/28/2015   MPG 117 (H) 03/18/2010   TSH No results for input(s): TSH in the last 8760 hours.   Cardiac studies:   CT angiogram chest 08/08/2017:  There is an ascending aortic aneurysm with diameter of 4.4 cm, unchanged. The main pulmonary  artery remains dilated with diameter  of 3.6 cm. Comparision:  09/30/2016: Unchanged aneurysmal enlargement of the ascending aorta measuring 46 mm.  Recommend semiannual imaging by CTA or MRA.  Chronic dilatation of the pulmonary arterial tree as seen with pulmonary hypertension. No significant change from CT angiogram chest and abdomen: 10/07/2015,  02/02/2013:  4.7 cm.   Echocardiogram 07/19/2017: Left ventricle cavity is normal in size. Normal global wall motion. Doppler evidence of grade I (impaired) diastolic dysfunction, normal LAP. Calculated EF 65%. Mild aortic valve leaflet calcification with mild (Grade I) regurgitation.  Mild tricuspid regurgitation. Estimated pulmonary artery systolic pressure 22 mmHg. The aortic root is mildly dilated. Ascending aortic aneurysm measuring 4.6cm, minimal interval increase by 0.2 cm compared to prior echocardiogram in 2015.  Event monitor 09/07/2017-09/21/2017: Normal sinus rhythm with rare PAC. 2 patient triggered events for fluttered/skipped beats revealed normal sinus rhythm with one PAC. 2 patient triggered events for fluttered/skipped beats revealed normal sinus rhythm and PAC. No othersignificant arrhythmia noted. Minimal heart rate 59 bpm and maxillary 85 bpm.  Holter Monitor 48 hours 06/23/2012: Predominant rhythm is sinus rhythm. Occasional PVC and PAC noted.   Lexiscan myoview stress 12/09/12:  1. Resting EKG NSR, Poor R wave progression. Stress EKG was non diagnostic for ischemia. No ST-T changes of ischemia noted with pharmacologic stress testing. Stress symptoms included shortness of breath and light headedness.  2. The perfusion study demonstrated normal isotope uptake both at rest and stress. There was no evidence of ischemia or scar. Dynamic gated images reveal normal wall motion and endocardial thickening. Left ventricular ejection fraction was estimated to be 71%.   Assessment:    Thoracic ascending aortic aneurysm [facility])  Essential  hypertension, benign  Palpitations  EKG 07/01/2017: Number sinus rhythm at rate of 81 bpm, left axis deviation, left anterior fascicular block.  Low-voltage  complexes.  No evidence of ischemia. No significant change from EKG 09/24/2016: Sinus rhythm with first-degree AV block at the rate.  Recommendations:    Patient states that she is doing remarkably well since being on possibleAcebutalol, blood pressure has been very well controlled and although the pharmacy and insurance companies are trying to change the medication, she would not want to change this.  I have tried multiple beta blockers in the past hence advised her not to change from the present Medications.  I also reviewed her CT angiogram of the chest that was performed in February 2019, there was no change noted and the result was addended from Radiologist. Updated the report in my chart.   She needs a repeat echocardiogram.  I'll set this up in 2-3 months from now in view of Covid 19 I'm trying to avoid Asian coming into the office sooner.  I'd like to see her back after the echocardiogram.  All questions answered.  Adrian Prows, MD, [facility] 10/06/2018, 10:38 AM [facility] [place] Pager: [phone] Office: [phone] If no answer Cell [phone]

## 2018-12-14 ENCOUNTER — Telehealth: Payer: Self-pay

## 2018-12-14 ENCOUNTER — Other Ambulatory Visit: Payer: Self-pay

## 2018-12-14 DIAGNOSIS — I712 Thoracic aortic aneurysm, without rupture: Secondary | ICD-10-CM

## 2018-12-14 DIAGNOSIS — I7121 Aneurysm of the ascending aorta, without rupture: Secondary | ICD-10-CM

## 2018-12-14 MED ORDER — ACEBUTOLOL HCL 200 MG PO CAPS: 200.0000 mg | ORAL_CAPSULE | Freq: Two times a day (BID) | ORAL | 3 refills | Status: DC

## 2018-12-14 NOTE — Telephone Encounter (Signed)
Pt called she has been out of her acebutolol for a couple days the manufacturer has been out and she needs it; Can you possibly change it?

## 2018-12-14 NOTE — Telephone Encounter (Signed)
She did that already and she cant find anything

## 2018-12-14 NOTE — Telephone Encounter (Signed)
Looking at her last OV note with JG, she has been intolerant to several beta blockers in the past and he advised her to not change. Can we do a mail order pharmacy?

## 2018-12-14 NOTE — Telephone Encounter (Signed)
Can she try another pharmacy so that the medication doesn't have to be changed?

## 2018-12-17 IMAGING — CT CT ANGIO CHEST-ABD-PELV FOR DISSECTION W/ AND WO/W CM
2 of 9 series · 7 of 36 positions shown, 13 images · IV contrast (iopamidol)
Comparison: Chest CT 10/07/2015

CT abdomen pelvis 02/10/2016
COMPARISON: Chest CT 10/07/2015

CT abdomen pelvis 02/10/2016

Addendum:
CLINICAL DATA: Chest pain

EXAM:
CT ANGIOGRAPHY CHEST, ABDOMEN AND PELVIS
TECHNIQUE: Multidetector CT imaging through the chest, abdomen and pelvis was
performed using the standard protocol during bolus administration of
intravenous contrast. Multiplanar reconstructed images and MIPs were
obtained and reviewed to evaluate the vascular anatomy.
CONTRAST:  100mL OMGFJT-KWB IOPAMIDOL (OMGFJT-KWB) INJECTION 76%

[Series 6: arterial f_0.5 · axial · arterial · 0.71mm/px · z∈[+855,+1277]mm · 6 of 297 slices shown, 11 images]
[im 43/297  mediastinal]
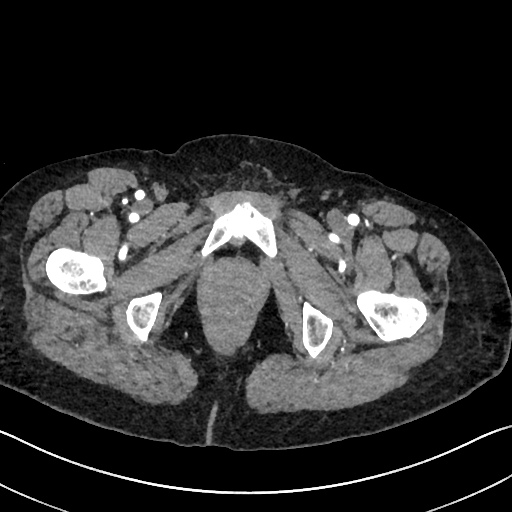
[im 43/297  bone]
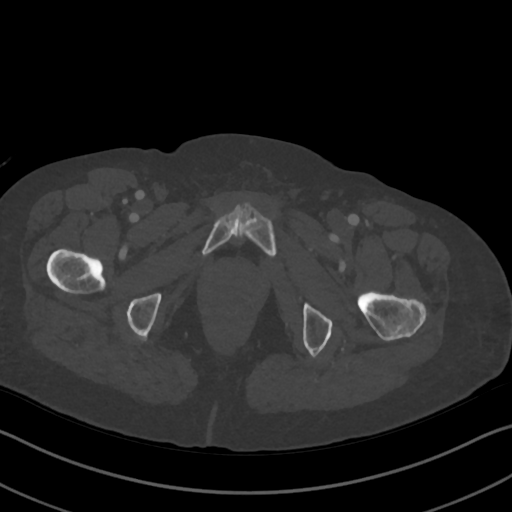
[im 85/297  mediastinal]
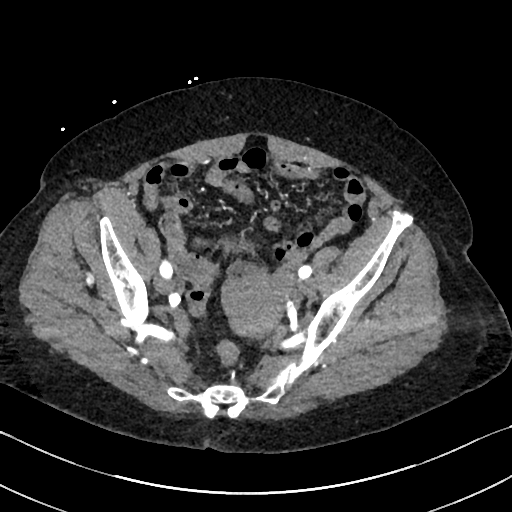
[im 127/297  mediastinal]
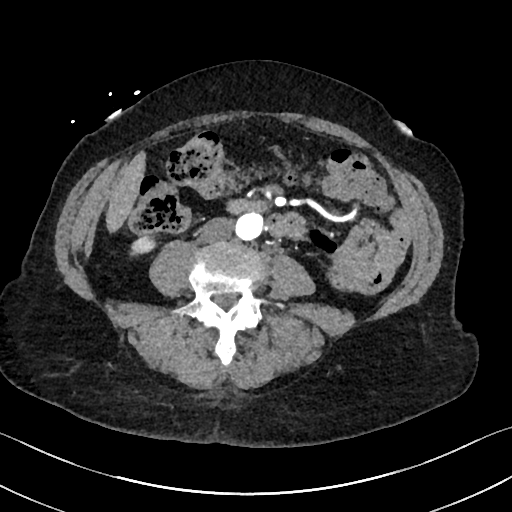
[im 127/297  lung]
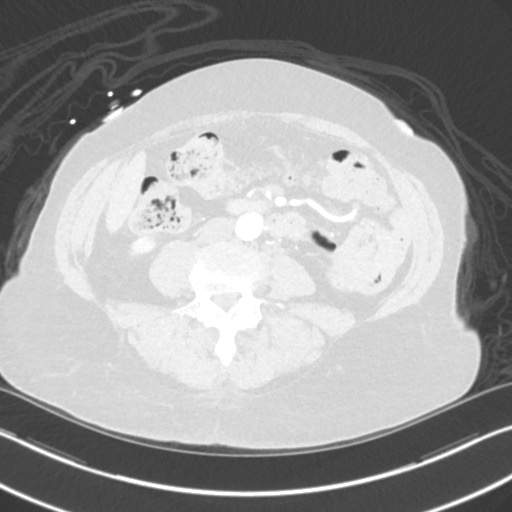
[im 170/297  mediastinal]
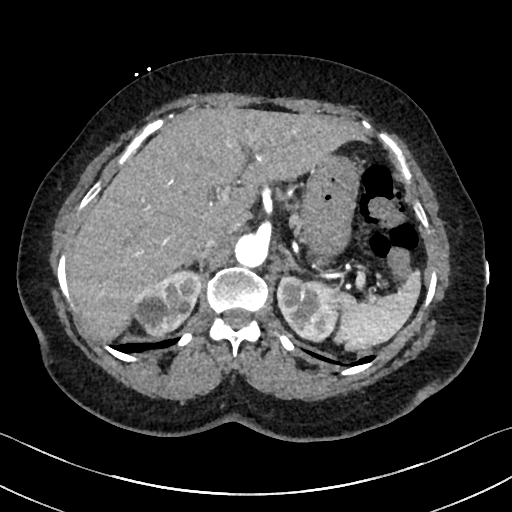
[im 170/297  lung]
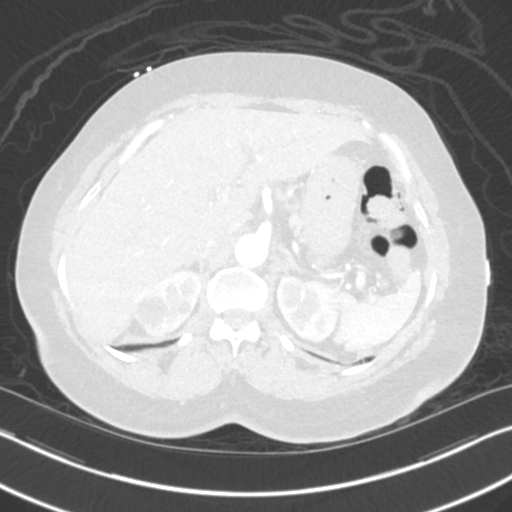
[im 212/297  mediastinal]
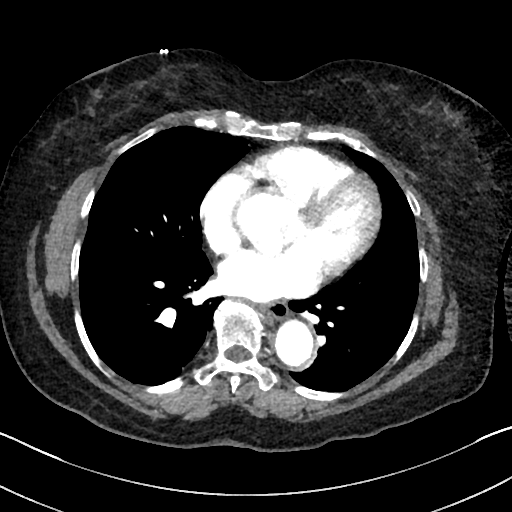
[im 212/297  lung]
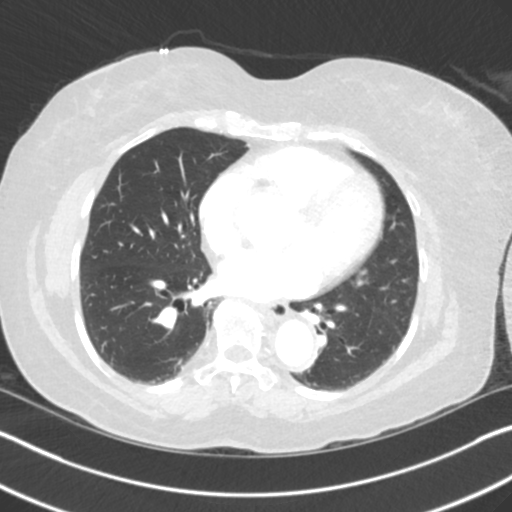
[im 254/297  mediastinal]
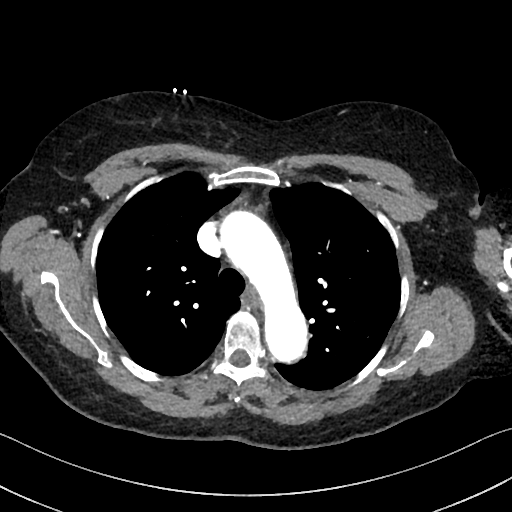
[im 254/297  lung]
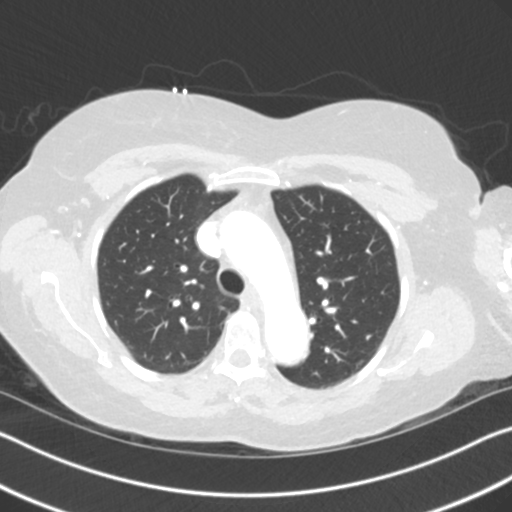

[Series 8: coronal f_0.5 · coronal · 0.72mm/px · 1 of 144 slices shown, 2 images]
[im 72/144  mediastinal]
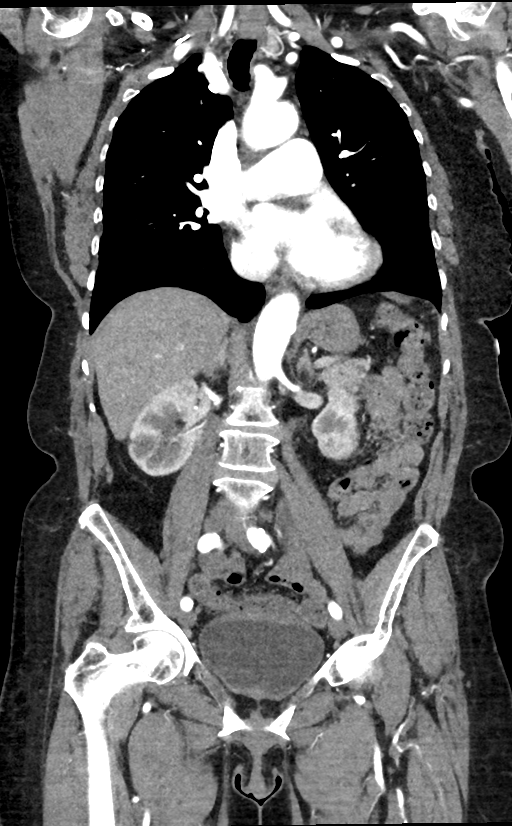
[im 72/144  bone]
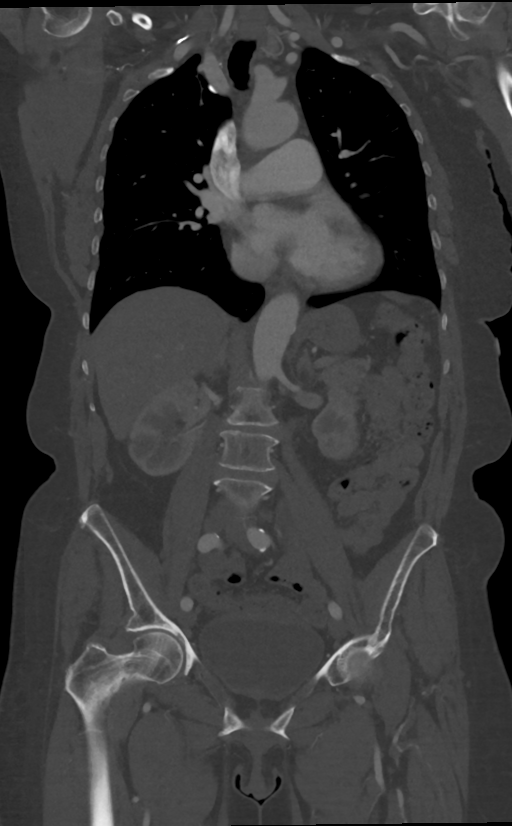

[7 of 36 positions shown; findings below may reference images not displayed]

FINDINGS: CTA CHEST FINDINGS

Cardiovascular: Heart size is normal. There is no pericardial
effusion.

The course and caliber of the thoracic aorta are normal. There is
mild aortic atherosclerotic calcification. There is no intramural
hematoma or blood pool and no dissection or penetrating ulcer.
Variant aortic arch branching pattern with independent origin of the
left vertebral artery and common origin of the left common carotid
and brachiocephalic arteries.

The central pulmonary arteries are normal.

Mediastinum/Nodes: No mediastinal, hilar or axillary
lymphadenopathy. The visualized thyroid and thoracic esophageal
course are unremarkable.

Lungs/Pleura: No pulmonary nodules or masses. No pleural effusion or
pneumothorax. No focal airspace consolidation. No focal pleural
abnormality.

Musculoskeletal: No chest wall abnormality. No acute osseous
abnormality.

Review of the MIP images confirms the above findings.

CTA ABDOMEN AND PELVIS FINDINGS

VASCULAR

Aorta: Normal caliber aorta without aneurysm, dissection, vasculitis
or hemodynamically significant stenosis. There is no aortic
atherosclerosis.

Celiac: No aneurysm, dissection or hemodynamically significant
stenosis.

SMA: Widely patent without dissection or stenosis. Replaced right
hepatic artery arises from the SMA.

Renals: Single renal arteries bilaterally. No aneurysm, dissection,
stenosis or evidence of fibromuscular dysplasia.

IMA: Patent. There is a posteriorly projecting aneurysm of the IMA
that measures 6 x 6 mm (series 6, image 178).

Inflow: There is calcific atherosclerotic disease of the common,
internal and external iliac arteries but no dissection or
flow-limiting stenosis.

Veins: Normal course and caliber of the major veins. Assessment is
otherwise limited by the arterial dominant contrast phase.

Review of the MIP images confirms the above findings.

NON-VASCULAR

Hepatobiliary: Normal hepatic contours and density. No visible
biliary dilatation. Normal gallbladder.

Pancreas: Normal contours without ductal dilatation. No
peripancreatic fluid collection.

Spleen: Normal arterial phase splenic enhancement pattern.

Adrenals/Urinary Tract:

--Adrenal glands: Normal.

--Right kidney/ureter: No hydronephrosis or perinephric stranding.
No nephrolithiasis. No obstructing ureteral stones.

--Left kidney/ureter: No hydronephrosis or perinephric stranding. No
nephrolithiasis. No obstructing ureteral stones.

--Urinary bladder: Unremarkable.

Stomach/Bowel:

--Stomach/Duodenum: No hiatal hernia or other gastric abnormality.
Normal duodenal course and caliber.

--Small bowel: No dilatation or inflammation.

--Colon: No focal abnormality.

--Appendix: Normal.

Lymphatic:  No abdominal or pelvic lymphadenopathy.

Reproductive: Prostate is mildly enlarged, measuring 5 cm.

Musculoskeletal. Multilevel degenerative disc disease and facet
arthrosis. No bony spinal canal stenosis.

Other: None.

Review of the MIP images confirms the above findings.
IMPRESSION: 1. No acute aortic syndrome or other acute abnormality of the chest,
abdomen or pelvis.
2.  Aortic Atherosclerosis (RI53M-42R.R).
3. 6 mm posteriorly projecting aneurysm arising from the proximal
portion of the inferior mesenteric artery.
4. Mildly enlarged prostate gland.

ADDENDUM:
There is an ascending aortic aneurysm with diameter of 4.4 cm,
unchanged. The main pulmonary artery remains dilated with diameter
of 3.6 cm. An error in the original report references an enlarged
prostate. In fact, the uterus is normal and there is no adnexal
mass.

*** End of Addendum ***
FINDINGS: CTA CHEST FINDINGS

Cardiovascular: Heart size is normal. There is no pericardial
effusion.

The course and caliber of the thoracic aorta are normal. There is
mild aortic atherosclerotic calcification. There is no intramural
hematoma or blood pool and no dissection or penetrating ulcer.
Variant aortic arch branching pattern with independent origin of the
left vertebral artery and common origin of the left common carotid
and brachiocephalic arteries.

The central pulmonary arteries are normal.

Mediastinum/Nodes: No mediastinal, hilar or axillary
lymphadenopathy. The visualized thyroid and thoracic esophageal
course are unremarkable.

Lungs/Pleura: No pulmonary nodules or masses. No pleural effusion or
pneumothorax. No focal airspace consolidation. No focal pleural
abnormality.

Musculoskeletal: No chest wall abnormality. No acute osseous
abnormality.

Review of the MIP images confirms the above findings.

CTA ABDOMEN AND PELVIS FINDINGS

VASCULAR

Aorta: Normal caliber aorta without aneurysm, dissection, vasculitis
or hemodynamically significant stenosis. There is no aortic
atherosclerosis.

Celiac: No aneurysm, dissection or hemodynamically significant
stenosis.

SMA: Widely patent without dissection or stenosis. Replaced right
hepatic artery arises from the SMA.

Renals: Single renal arteries bilaterally. No aneurysm, dissection,
stenosis or evidence of fibromuscular dysplasia.

IMA: Patent. There is a posteriorly projecting aneurysm of the IMA
that measures 6 x 6 mm (series 6, image 178).

Inflow: There is calcific atherosclerotic disease of the common,
internal and external iliac arteries but no dissection or
flow-limiting stenosis.

Veins: Normal course and caliber of the major veins. Assessment is
otherwise limited by the arterial dominant contrast phase.

Review of the MIP images confirms the above findings.

NON-VASCULAR

Hepatobiliary: Normal hepatic contours and density. No visible
biliary dilatation. Normal gallbladder.

Pancreas: Normal contours without ductal dilatation. No
peripancreatic fluid collection.

Spleen: Normal arterial phase splenic enhancement pattern.

Adrenals/Urinary Tract:

--Adrenal glands: Normal.

--Right kidney/ureter: No hydronephrosis or perinephric stranding.
No nephrolithiasis. No obstructing ureteral stones.

--Left kidney/ureter: No hydronephrosis or perinephric stranding. No
nephrolithiasis. No obstructing ureteral stones.

--Urinary bladder: Unremarkable.

Stomach/Bowel:

--Stomach/Duodenum: No hiatal hernia or other gastric abnormality.
Normal duodenal course and caliber.

--Small bowel: No dilatation or inflammation.

--Colon: No focal abnormality.

--Appendix: Normal.

Lymphatic:  No abdominal or pelvic lymphadenopathy.

Reproductive: Prostate is mildly enlarged, measuring 5 cm.

Musculoskeletal. Multilevel degenerative disc disease and facet
arthrosis. No bony spinal canal stenosis.

Other: None.

Review of the MIP images confirms the above findings.
IMPRESSION: 1. No acute aortic syndrome or other acute abnormality of the chest,
abdomen or pelvis.
2.  Aortic Atherosclerosis (RI53M-42R.R).
3. 6 mm posteriorly projecting aneurysm arising from the proximal
portion of the inferior mesenteric artery.
4. Mildly enlarged prostate gland.

## 2019-01-09 ENCOUNTER — Ambulatory Visit (INDEPENDENT_AMBULATORY_CARE_PROVIDER_SITE_OTHER): Payer: Medicare Other

## 2019-01-09 ENCOUNTER — Other Ambulatory Visit: Payer: Self-pay

## 2019-01-09 DIAGNOSIS — I7121 Aneurysm of the ascending aorta, without rupture: Secondary | ICD-10-CM

## 2019-01-09 DIAGNOSIS — I10 Essential (primary) hypertension: Secondary | ICD-10-CM

## 2019-01-09 DIAGNOSIS — I712 Thoracic aortic aneurysm, without rupture: Secondary | ICD-10-CM | POA: Diagnosis not present

## 2019-01-16 ENCOUNTER — Ambulatory Visit: Payer: Medicare Other | Admitting: Cardiology

## 2019-01-19 ENCOUNTER — Encounter: Payer: Self-pay | Admitting: Cardiology

## 2019-01-23 ENCOUNTER — Other Ambulatory Visit: Payer: Self-pay

## 2019-01-23 ENCOUNTER — Telehealth (INDEPENDENT_AMBULATORY_CARE_PROVIDER_SITE_OTHER): Payer: Medicare Other | Admitting: Cardiology

## 2019-01-23 VITALS — BP 122/79 | HR 64 | Ht 66.0 in | Wt 178.0 lb

## 2019-01-23 DIAGNOSIS — I7121 Aneurysm of the ascending aorta, without rupture: Secondary | ICD-10-CM

## 2019-01-23 DIAGNOSIS — R002 Palpitations: Secondary | ICD-10-CM | POA: Diagnosis not present

## 2019-01-23 DIAGNOSIS — I10 Essential (primary) hypertension: Secondary | ICD-10-CM

## 2019-01-23 DIAGNOSIS — I712 Thoracic aortic aneurysm, without rupture: Secondary | ICD-10-CM

## 2019-01-23 NOTE — Progress Notes (Signed)
Virtual Visit via Telephone Note: Patient unable to use video assisted device.  This visit type was conducted due to national recommendations for restrictions regarding the COVID-19 Pandemic (e.g. social distancing).  This format is felt to be most appropriate for this patient at this time.  All issues noted in this document were discussed and addressed.  No physical exam was performed.  The patient has consented to conduct a Telehealth visit and understands insurance will be billed.   I connected with@, on 01/23/19 at  by TELEPHONE and verified that I am speaking with the correct person using two identifiers.   I discussed the limitations of evaluation and management by telemedicine and the availability of in person appointments. The patient expressed understanding and agreed to proceed.   I have discussed with patient regarding the safety during COVID Pandemic and steps and precautions to be taken including social distancing, frequent hand wash and use of detergent soap, gels with the patient. I asked the patient to avoid touching mouth, nose, eyes, ears with the hands. I encouraged regular walking around the neighborhood and exercise and regular diet, as long as social distancing can be maintained.   Subjective:  Primary Physician/Referring:  Rocco Serene, MD  Patient ID: [name], female    DOB: [birth year], 83 y.o.   MRN: [id number]  No chief complaint on file.   HPI: [name]  is a 83 y.o. female  with  Hypertension, hyperglycemia, chronic palpitations and event and Holter monitor in 2014 and 2019 revealing occasional PAC and PVC, hypertension, hyperlipidemia, asymptomatic aortic aneurysm of moderate size. She has multiple medication intolerances including statins.  She has not had any chest pain or shortness of breath. Patient has known history of thoracic aortic aneurysm incidentally detected by CT scan of the chest performed in 2014 at 4.7 cm and has remained stable by last  CTA in 2018. This is her annual visit. Due to COVID 19, seeing her virtually.   States that since being on acebutolol, symptoms of palpitations are essentially resolved and blood pressure is also very well controlled, but recently thinks the insurance company is trying to change her medication and has noticed slight worsening palpitations.  She has not had any chest pain and states that she is doing well.  Past Medical History:  Diagnosis Date  . Allergy   . Anemia   . Anxiety    takes Klonopin daily  as needed  . Arthritis   . Blood transfusion without reported diagnosis   . Cataract   . Diffuse cystic mastopathy   . GERD (gastroesophageal reflux disease)   . Headache   . History of colonoscopy   . Hyperlipidemia   . Macular degeneration (senile) of retina, unspecified   . Nontoxic uninodular goiter   . Palpitations    takes Sectral nightly  . Pre-diabetes   . Sickle cell trait ([place])   . Vertigo    takes Meclizine daily as needed    Past Surgical History:  Procedure Laterality Date  . CATARACT EXTRACTION W/PHACO Right 04/03/2015   Procedure: CATARACT EXTRACTION PHACO AND INTRAOCULAR LENS PLACEMENT (IOC);  Surgeon: Marylynn Pearson, MD;  Location: [facility];  Service: Ophthalmology;  Laterality: Right;  . ESOPHAGOGASTRODUODENOSCOPY N/A 11/28/2015   Procedure: ESOPHAGOGASTRODUODENOSCOPY (EGD);  Surgeon: Gatha Mayer, MD;  Location: [facility] ENDOSCOPY;  Service: Endoscopy;  Laterality: N/A;  . TOTAL KNEE ARTHROPLASTY    . TRANSANAL EXCISION OF RECTAL MASS  07/2010   pathology: hemorrhoid.  Social History   Socioeconomic History  . Marital status: Divorced    Spouse name: Not on file  . Number of children: 2  . Years of education: 43  . Highest education level: Not on file  Occupational History  . Not on file  Social Needs  . Financial resource strain: Not on file  . Food insecurity    Worry: Not on file    Inability: Not on file  . Transportation needs    Medical: Not on file     Non-medical: Not on file  Tobacco Use  . Smoking status: Never Smoker  . Smokeless tobacco: Never Used  Substance and Sexual Activity  . Alcohol use: No  . Drug use: No  . Sexual activity: Never  Lifestyle  . Physical activity    Days per week: Not on file    Minutes per session: Not on file  . Stress: Not on file  Relationships  . Social Herbalist on phone: Not on file    Gets together: Not on file    Attends religious service: Not on file    Active member of club or organization: Not on file    Attends meetings of clubs or organizations: Not on file    Relationship status: Not on file  . Intimate partner violence    Fear of current or ex partner: Not on file    Emotionally abused: Not on file    Physically abused: Not on file    Forced sexual activity: Not on file  Other Topics Concern  . Not on file  Social History Narrative   Lives with daughter   Caffeine use: none   Regular Exercise -  NO       Current Outpatient Medications on File Prior to Visit  Medication Sig Dispense Refill  . acebutolol (SECTRAL) 200 MG capsule Take 1 capsule (200 mg total) by mouth 2 (two) times daily. 180 capsule 3  . amlodipine-olmesartan (AZOR) 10-20 MG tablet     . clonazePAM (KLONOPIN) 0.5 MG tablet TAKE 1 TABLET BY MOUTH TWICE A DAY AS NEEDED FOR ANXIETY 60 tablet 0  . [facility] D3 5000 UNITS capsule Take 5,000 Units by mouth once a week.   2  . meclizine (ANTIVERT) 25 MG tablet TAKE 1 TABLET BY MOUTH 3 TIMES A DAY AS NEEDED VERTIGO 50 tablet 0  . PROAIR HFA 108 (90 Base) MCG/ACT inhaler TAKE 2 PUFFS BY MOUTH 4 TIMES A DAY     No current facility-administered medications on file prior to visit.     Review of Systems  Constitution: Negative for chills, decreased appetite, malaise/fatigue and weight gain.  Cardiovascular: Positive for irregular heartbeat and leg swelling. Negative for dyspnea on exertion and syncope.  Endocrine: Negative for cold intolerance.   Hematologic/Lymphatic: Does not bruise/bleed easily.  Musculoskeletal: Negative for joint swelling.  Gastrointestinal: Positive for heartburn. Negative for abdominal pain, anorexia and change in bowel habit.  Neurological: Negative for headaches and light-headedness.  Psychiatric/Behavioral: Negative for depression and substance abuse.  All other systems reviewed and are negative.     Objective:  Blood pressure 122/79, pulse 64, height '5\' 6"'$  (1.676 m), weight 178 lb (80.7 kg). Body mass index is 28.73 kg/m. As it is a virtual visit, physical examination was not performed.  Prior examination is as below.  Physical Exam  Constitutional: She appears well-developed and well-nourished. No distress.  HENT:  Head: Atraumatic.  Eyes: Conjunctivae are normal.  Neck: Neck  supple. No JVD present. No thyromegaly present.  Cardiovascular: Normal rate, regular rhythm, S1 normal, S2 normal and intact distal pulses. Exam reveals no gallop.  Murmur heard.  Harsh early systolic murmur is present with a grade of 2/6 at the upper right sternal border radiating to the neck. High-pitched blowing decrescendo early diastolic murmur is present with a grade of 2/6 at the upper right sternal border radiating to the apex. II/VI SEM in the right parasternal border  Pulmonary/Chest: Effort normal and breath sounds normal.  Abdominal: Soft. Bowel sounds are normal.  Musculoskeletal: Normal range of motion.        General: No edema.  Neurological: She is alert.  Skin: Skin is warm and dry.  Psychiatric: She has a normal mood and affect.    Radiology: No results found.  Laboratory Examination: 06/29/2017: Creatinine 0.74, EGFR 89, potassium 3.9, CMP normal.  RBC 3.68, normal H&H, CBC otherwise normal.  CMP Latest Ref Rng & Units 08/07/2017 11/27/2015 11/27/2015  Glucose 65 - 99 mg/dL 109(H) 90 101(H)  BUN 6 - 20 mg/dL '10 13 19  '$ Creatinine 0.44 - 1.00 mg/dL 0.79 0.65 0.77  Sodium 135 - 145 mmol/L 137 139 140   Potassium 3.5 - 5.1 mmol/L 3.9 3.8 4.0  Chloride 101 - 111 mmol/L 104 111 111  CO2 22 - 32 mmol/L '22 23 23  '$ Calcium 8.9 - 10.3 mg/dL 9.5 8.6(L) 8.8(L)  Total Protein 6.0 - 8.5 g/dL - - -  Total Bilirubin 0.0 - 1.2 mg/dL - - -  Alkaline Phos 39 - 117 IU/L - - -  AST 0 - 40 IU/L - - -  ALT 0 - 32 IU/L - - -   CBC Latest Ref Rng & Units 08/07/2017 11/30/2015 11/29/2015  WBC 4.0 - 10.5 K/uL 6.3 5.3 5.2  Hemoglobin 12.0 - 15.0 g/dL 12.6 8.7(L) 8.8(L)  Hematocrit 36.0 - 46.0 % 36.4 25.9(L) 26.2(L)  Platelets 150 - 400 K/uL 192 182 164   Lipid Panel     Component Value Date/Time   CHOL 203 (H) 08/28/2015 1604   TRIG 172 (H) 08/28/2015 1604   HDL 57 08/28/2015 1604   CHOLHDL 3.6 08/28/2015 1604   CHOLHDL 3 06/24/2011 0911   VLDL 17.0 06/24/2011 0911   LDLCALC 112 (H) 08/28/2015 1604   LDLDIRECT 139.2 04/15/2011 0908   HEMOGLOBIN A1C Lab Results  Component Value Date   HGBA1C 5.7 (H) 08/28/2015   MPG 117 (H) 03/18/2010   TSH No results for input(s): TSH in the last 8760 hours.   Cardiac studies:   CT angiogram chest 08/08/2017:  There is an ascending aortic aneurysm with diameter of 4.4 cm, unchanged. The main pulmonary artery remains dilated with diameter  of 3.6 cm. Comparision:  09/30/2016: Unchanged aneurysmal enlargement of the ascending aorta measuring 46 mm.  Recommend semiannual imaging by CTA or MRA.  Chronic dilatation of the pulmonary arterial tree as seen with pulmonary hypertension. No significant change from CT angiogram chest and abdomen: 10/07/2015,  02/02/2013:  4.7 cm.   Echocardiogram 07/19/2017: Left ventricle cavity is normal in size. Normal global wall motion. Doppler evidence of grade I (impaired) diastolic dysfunction, normal LAP. Calculated EF 65%. Mild aortic valve leaflet calcification with mild (Grade I) regurgitation.  Mild tricuspid regurgitation. Estimated pulmonary artery systolic pressure 22 mmHg. The aortic root is mildly dilated. Ascending aortic  aneurysm measuring 4.6cm, minimal interval increase by 0.2 cm compared to prior echocardiogram in 2015.  Event monitor 09/07/2017-09/21/2017: Normal sinus rhythm with rare  PAC. 2 patient triggered events for fluttered/skipped beats revealed normal sinus rhythm with one PAC. 2 patient triggered events for fluttered/skipped beats revealed normal sinus rhythm and PAC. No othersignificant arrhythmia noted. Minimal heart rate 59 bpm and maxillary 85 bpm.  Holter Monitor 48 hours 06/23/2012: Predominant rhythm is sinus rhythm. Occasional PVC and PAC noted.   Lexiscan myoview stress 12/09/12:  1. Resting EKG NSR, Poor R wave progression. Stress EKG was non diagnostic for ischemia. No ST-T changes of ischemia noted with pharmacologic stress testing. Stress symptoms included shortness of breath and light headedness.  2. The perfusion study demonstrated normal isotope uptake both at rest and stress. There was no evidence of ischemia or scar. Dynamic gated images reveal normal wall motion and endocardial thickening. Left ventricular ejection fraction was estimated to be 71%.   Assessment:      ICD-10-CM   1. Thoracic ascending aortic aneurysm (HCC)  I71.2   2. Essential hypertension, benign  I10   3. Palpitations  R00.2    EKG 07/01/2017: Number sinus rhythm at rate of 81 bpm, left axis deviation, left anterior fascicular block.  Low-voltage complexes.  No evidence of ischemia. No significant change from EKG 09/24/2016: Sinus rhythm with first-degree AV block at the rate.  Recommendations:    Patient states that she is doing remarkably well since being on  Acebutalol, blood pressure has been very well controlled and although the pharmacy and insurance companies are trying to change the medication, she would not want to change this.  I have tried multiple beta blockers in the past hence advised her not to change from the present Medications.    Patient also now states that the color of the medication is  changed, Acebutolol comes in 200 mg twice daily doses both in orange and also 1 in orange and purple combination. Advised her that it is the same medication.   Advised her to try the medication 3 times daily instead of twice daily to see if palpitation symptoms would improve.  Blood pressure is under excellent control.  If indeed he does work, will send in a new prescription.  Otherwise we will also try to call around to different pharmacies to see if they are able to procure orange colored capsules.  I also reviewed her CT angiogram of the chest that was performed in February 2019, there was no change noted.  I also reviewed the results of the recently performed echocardiogram, there is a good correlation between echocardiogram and the CT scan.  We could consider biannual CT scan and annual echocardiogram to follow-up on the ascending aortic aneurysm. I would like to see him back in 6 months in the office.  This was a 15-minute telephone encounter.  Adrian Prows, MD, [facility] 01/23/2019, 9:36 AM [facility] [place] Pager: [phone] Office: [phone] If no answer Cell [phone]

## 2019-01-31 ENCOUNTER — Encounter: Payer: Self-pay | Admitting: Cardiology

## 2019-02-22 ENCOUNTER — Telehealth: Payer: Self-pay

## 2019-02-22 NOTE — Telephone Encounter (Signed)
Called pt to inform her to continue taking the medication. Pt mention that the medication is not the same as the one she had before due that the manufacture is different then the one she had before. Pt stated that this new one is not working the same way as the old one. Please advise thank you

## 2019-02-22 NOTE — Telephone Encounter (Signed)
Called pt to inform her that she would need to call the pharmacy to see if they have another manufacture that will work for her. Pt understood

## 2019-02-22 NOTE — Telephone Encounter (Signed)
Her symptoms are suggestive of PAC/PVC which are palpitations. I would recommend that she continue to take the acebutolol and this should help. She has previously had improvement in symptoms with this.

## 2019-02-22 NOTE — Telephone Encounter (Signed)
Pt called to inform us that she has been feeling like her heart is flipping or missing a beat for the past few days, pt mention that she was told to take acebutolol 200mg  BID for palps, But pt mention she thinks it is not papls, should she still be taking that medication. Please advice thank you.

## 2019-02-24 ENCOUNTER — Telehealth: Payer: Self-pay | Admitting: Cardiology

## 2019-02-24 ENCOUNTER — Ambulatory Visit: Payer: Medicare Other | Admitting: Cardiology

## 2019-02-24 NOTE — Telephone Encounter (Signed)
Patient came in today to discuss palpitations. I canceled her office visit as her palpitations had improved with stopping the Symbicort and feel that this is likely the etiology. She has continued to have occasional palpitations, but are better than before. Encouraged her to continue to see if they improve with stopping the inhaler and if not to call us back. Discussed using vagal maneuvers to also help. Will see her back as previously scheduled.

## 2019-03-06 ENCOUNTER — Other Ambulatory Visit: Payer: Self-pay | Admitting: Physician Assistant

## 2019-03-06 DIAGNOSIS — R1310 Dysphagia, unspecified: Secondary | ICD-10-CM

## 2019-03-07 ENCOUNTER — Other Ambulatory Visit: Payer: Self-pay | Admitting: Physician Assistant

## 2019-03-07 DIAGNOSIS — E042 Nontoxic multinodular goiter: Secondary | ICD-10-CM

## 2019-03-27 ENCOUNTER — Ambulatory Visit
Admission: RE | Admit: 2019-03-27 | Discharge: 2019-03-27 | Disposition: A | Discharge status: Home / Self Care | Payer: Medicare Other | Source: Ambulatory Visit | Attending: Physician Assistant | Admitting: Physician Assistant

## 2019-03-27 DIAGNOSIS — E042 Nontoxic multinodular goiter: Secondary | ICD-10-CM

## 2019-03-27 DIAGNOSIS — R1310 Dysphagia, unspecified: Secondary | ICD-10-CM

## 2019-04-18 ENCOUNTER — Other Ambulatory Visit: Payer: Self-pay | Admitting: Internal Medicine

## 2019-04-18 DIAGNOSIS — R109 Unspecified abdominal pain: Secondary | ICD-10-CM

## 2019-04-24 ENCOUNTER — Other Ambulatory Visit: Payer: Medicare Other

## 2019-04-28 ENCOUNTER — Ambulatory Visit
Admission: RE | Admit: 2019-04-28 | Discharge: 2019-04-28 | Disposition: A | Discharge status: Home / Self Care | Payer: Medicare Other | Source: Ambulatory Visit | Attending: Internal Medicine | Admitting: Internal Medicine

## 2019-04-28 DIAGNOSIS — R109 Unspecified abdominal pain: Secondary | ICD-10-CM

## 2019-04-30 ENCOUNTER — Ambulatory Visit (HOSPITAL_COMMUNITY)
Admission: EM | Admit: 2019-04-30 | Discharge: 2019-04-30 | Disposition: A | Discharge status: Home / Self Care | Payer: Medicare Other | Attending: Family Medicine | Admitting: Family Medicine

## 2019-04-30 ENCOUNTER — Other Ambulatory Visit: Payer: Self-pay

## 2019-04-30 ENCOUNTER — Encounter (HOSPITAL_COMMUNITY): Payer: Self-pay | Admitting: Emergency Medicine

## 2019-04-30 DIAGNOSIS — H1032 Unspecified acute conjunctivitis, left eye: Secondary | ICD-10-CM

## 2019-04-30 MED ORDER — GENTAMICIN SULFATE 0.3 % OP SOLN: 1.0000 [drp] | Freq: Four times a day (QID) | OPHTHALMIC | 0 refills | Status: DC

## 2019-04-30 NOTE — ED Provider Notes (Signed)
[place]    CSN: [id number] Arrival date & time: 04/30/19  1216      History   Chief Complaint Chief Complaint  Patient presents with  . Conjunctivitis    left    HPI [name] is a 83 y.o. female.   83 year old female who complains of drainage and irritation in her left eye.  She denies visual loss or photophobia.  There has been some yellow exudate.  There is no known exposure.  She lives in a house with daughter son-in-law and grandson.  She denies any allergic symptoms  HPI  Past Medical History:  Diagnosis Date  . Allergy   . Anemia   . Anxiety    takes Klonopin daily  as needed  . Arthritis   . Blood transfusion without reported diagnosis   . Cataract   . Diffuse cystic mastopathy   . GERD (gastroesophageal reflux disease)   . Headache   . History of colonoscopy   . Hyperlipidemia   . Macular degeneration (senile) of retina, unspecified   . Nontoxic uninodular goiter   . Palpitations    takes Sectral nightly  . Pre-diabetes   . Sickle cell trait ([place])   . Vertigo    takes Meclizine daily as needed    Patient Active Problem List   Diagnosis Date Noted  . Dieulafoy lesion of stomach   . Upper GI bleed   . Rectal bleed 11/27/2015  . Gastrointestinal bleeding 11/27/2015  . Black stools 11/27/2015  . Symptomatic anemia 11/27/2015  . Optic neuropathy 09/01/2015  . Multinodular goiter (nontoxic) 12/26/2012  . Palpitations 03/13/2012  . DJD (degenerative joint disease) of knee 12/14/2011  . Anemia, unspecified 11/13/2010  . GERD (gastroesophageal reflux disease) 09/15/2010  . SVT/ PSVT/ PAT 08/07/2010  . Anxiety 06/26/2010  . Thoracic ascending aortic aneurysm ([place]) 05/30/2010  . HYPERLIPIDEMIA 02/20/2010  . THYROID NODULE 10/29/2007  . MACULAR DEGENERATION 10/29/2007  . Essential hypertension, benign 10/29/2007    Past Surgical History:  Procedure Laterality Date  . CATARACT EXTRACTION W/PHACO Right 04/03/2015   Procedure:  CATARACT EXTRACTION PHACO AND INTRAOCULAR LENS PLACEMENT (IOC);  Surgeon: Marylynn Pearson, MD;  Location: [facility];  Service: Ophthalmology;  Laterality: Right;  . ESOPHAGOGASTRODUODENOSCOPY N/A 11/28/2015   Procedure: ESOPHAGOGASTRODUODENOSCOPY (EGD);  Surgeon: Gatha Mayer, MD;  Location[facility] ENDOSCOPY;  Service: Endoscopy;  Laterality: N/A;  . TOTAL KNEE ARTHROPLASTY    . TRANSANAL EXCISION OF RECTAL MASS  07/2010   pathology: hemorrhoid.     OB History   No obstetric history on file.      Home Medications    Prior to Admission medications   Medication Sig Start Date End Date Taking? Authorizing Provider  acebutolol (SECTRAL) 200 MG capsule Take 1 capsule (200 mg total) by mouth 2 (two) times daily. 12/14/18  Yes Miquel Dunn, NP  amlodipine-olmesartan (AZOR) 10-20 MG tablet  05/10/18  Yes [provider]  clonazePAM (KLONOPIN) 0.5 MG tablet TAKE 1 TABLET BY MOUTH TWICE A DAY AS NEEDED FOR ANXIETY 04/25/12  Yes Janith Lima, MD  [facility] D3 5000 UNITS capsule Take 5,000 Units by mouth once a week.  03/20/15  Yes [provider]  pantoprazole (PROTONIX) 40 MG tablet TAKE 1 TABLET BY MOUTH ONCE DAILY FOR 30 DAYS 02/22/19  Yes [provider]  PROAIR HFA 108 (90 Base) MCG/ACT inhaler TAKE 2 PUFFS BY MOUTH 4 TIMES A DAY 07/03/18  Yes [provider]  meclizine (ANTIVERT) 25 MG  tablet TAKE 1 TABLET BY MOUTH 3 TIMES A DAY AS NEEDED VERTIGO 09/28/11   Janith Lima, MD    Family History Family History  Problem Relation Age of Onset  . Coronary artery disease Other   . Heart disease Sister   . Heart disease Brother   . Colon cancer Neg Hx   . Cancer Neg Hx   . Stroke Neg Hx   . Neuropathy Neg Hx   . Multiple sclerosis Neg Hx   . Migraines Neg Hx   . Esophageal cancer Neg Hx   . Rectal cancer Neg Hx   . Stomach cancer Neg Hx     Social History Social History   Tobacco Use  . Smoking status: Never Smoker  . Smokeless tobacco: Never Used   Substance Use Topics  . Alcohol use: No  . Drug use: No     Allergies   Statins, Ibuprofen, and Prednisone   Review of Systems Review of Systems  Eyes: Positive for discharge and redness. Negative for photophobia, pain and visual disturbance.  All other systems reviewed and are negative.    Physical Exam Triage Vital Signs ED Triage Vitals  Enc Vitals Group     BP 04/30/19 1229 121/70     Pulse Rate 04/30/19 1229 65     Resp 04/30/19 1229 16     Temp 04/30/19 1229 99.2 F (37.3 C)     Temp Source 04/30/19 1229 Oral     SpO2 04/30/19 1229 96 %     Weight --      Height --      Head Circumference --      Peak Flow --      Pain Score 04/30/19 1226 6     Pain Loc --      Pain Edu? --      Excl. in [place]? --    No data found.  Updated Vital Signs BP 121/70 (BP Location: Left Arm)   Pulse 65   Temp 99.2 F (37.3 C) (Oral)   Resp 16   SpO2 96%   Visual Acuity Right Eye Distance:   Left Eye Distance:   Bilateral Distance:    Right Eye Near:   Left Eye Near:    Bilateral Near:     Physical Exam Vitals signs and nursing note reviewed.  Constitutional:      Appearance: Normal appearance.  Eyes:     Extraocular Movements: Extraocular movements intact.     Pupils: Pupils are equal, round, and reactive to light.     Comments: Left eye: Mildly inflamed.  There is some exudate at the nasolacrimal duct. Tactile pressure is normal compared to right eye. No signs of iritis or cloudy cornea Pupil is round and reactive  Neurological:     Mental Status: She is alert.      UC Treatments / Results  Labs (all labs ordered are listed, but only abnormal results are displayed) Labs Reviewed - No data to display  EKG   Radiology No results found.  Procedures Procedures (including critical care time)  Medications Ordered in UC Medications - No data to display  Initial Impression / Assessment and Plan / UC Course  I have reviewed the triage vital signs and  the nursing notes.  Pertinent labs & imaging results that were available during my care of the patient were reviewed by me and considered in my medical decision making (see chart for details).     Conjunctivitis, left eye  Final Clinical Impressions(s) / UC Diagnoses   Final diagnoses:  None   Discharge Instructions   None    ED Prescriptions    None     PDMP not reviewed this encounter.   Wardell Honour, MD 04/30/19 1247

## 2019-04-30 NOTE — ED Triage Notes (Signed)
Pt reports three days of eye irritation, with tenderness, drainage and itching.

## 2019-07-14 ENCOUNTER — Ambulatory Visit: Payer: Medicare Other | Admitting: Podiatry

## 2019-07-21 ENCOUNTER — Other Ambulatory Visit: Payer: Self-pay

## 2019-07-21 ENCOUNTER — Ambulatory Visit (INDEPENDENT_AMBULATORY_CARE_PROVIDER_SITE_OTHER): Payer: Medicare Other

## 2019-07-21 ENCOUNTER — Other Ambulatory Visit: Payer: Self-pay | Admitting: Podiatry

## 2019-07-21 ENCOUNTER — Ambulatory Visit (INDEPENDENT_AMBULATORY_CARE_PROVIDER_SITE_OTHER): Payer: Medicare Other | Admitting: Podiatry

## 2019-07-21 DIAGNOSIS — M2041 Other hammer toe(s) (acquired), right foot: Secondary | ICD-10-CM

## 2019-07-21 DIAGNOSIS — M79671 Pain in right foot: Secondary | ICD-10-CM

## 2019-07-21 DIAGNOSIS — M205X1 Other deformities of toe(s) (acquired), right foot: Secondary | ICD-10-CM | POA: Diagnosis not present

## 2019-07-21 NOTE — Patient Instructions (Signed)
Pre-Operative Instructions  Congratulations, you have decided to take an important step towards improving your quality of life.  You can be assured that the doctors and staff at [facility] will be with you every step of the way.  Here are some important things you should know:  1. Plan to be at the surgery center/hospital at least 1 (one) hour prior to your scheduled time, unless otherwise directed by the surgical center/hospital staff.  You must have a responsible adult accompany you, remain during the surgery and drive you home.  Make sure you have directions to the surgical center/hospital to ensure you arrive on time. 2. If you are having surgery at [facility] or [facility], you will need a copy of your medical history and physical form from your family physician within one month prior to the date of surgery. We will give you a form for your primary physician to complete.  3. We make every effort to accommodate the date you request for surgery.  However, there are times where surgery dates or times have to be moved.  We will contact you as soon as possible if a change in schedule is required.   4. No aspirin/ibuprofen for one week before surgery.  If you are on aspirin, any non-steroidal anti-inflammatory medications (Mobic, Aleve, Ibuprofen) should not be taken seven (7) days prior to your surgery.  You make take Tylenol for pain prior to surgery.  5. Medications - If you are taking daily heart and blood pressure medications, seizure, reflux, allergy, asthma, anxiety, pain or diabetes medications, make sure you notify the surgery center/hospital before the day of surgery so they can tell you which medications you should take or avoid the day of surgery. 6. No food or drink after midnight the night before surgery unless directed otherwise by surgical center/hospital staff. 7. No alcoholic beverages 24-hours prior to surgery.  No smoking 24-hours prior or 24-hours after  surgery. 8. Wear loose pants or shorts. They should be loose enough to fit over bandages, boots, and casts. 9. Don't wear slip-on shoes. Sneakers are preferred. 10. Bring your boot with you to the surgery center/hospital.  Also bring crutches or a walker if your physician has prescribed it for you.  If you do not have this equipment, it will be provided for you after surgery. 11. If you have not been contacted by the surgery center/hospital by the day before your surgery, call to confirm the date and time of your surgery. 12. Leave-time from work may vary depending on the type of surgery you have.  Appropriate arrangements should be made prior to surgery with your employer. 13. Prescriptions will be provided immediately following surgery by your doctor.  Fill these as soon as possible after surgery and take the medication as directed. Pain medications will not be refilled on weekends and must be approved by the doctor. 14. Remove nail polish on the operative foot and avoid getting pedicures prior to surgery. 15. Wash the night before surgery.  The night before surgery wash the foot and leg well with water and the antibacterial soap provided. Be sure to pay special attention to beneath the toenails and in between the toes.  Wash for at least three (3) minutes. Rinse thoroughly with water and dry well with a towel.  Perform this wash unless told not to do so by your physician.  Enclosed: 1 Ice pack (please put in freezer the night before surgery)   1 Hibiclens skin cleaner     Pre-op instructions  If you have any questions regarding the instructions, please do not hesitate to call our office.  [place]: [address], [place], [place] [address] -- [phone]  [place]: [address]  [place], [place] [address] -- [phone]  [place]: [address], [place], [place] [address] -- [phone]   Website: [email or web address] 

## 2019-07-25 ENCOUNTER — Encounter: Payer: Self-pay | Admitting: Podiatry

## 2019-07-25 ENCOUNTER — Telehealth: Payer: Self-pay | Admitting: Podiatry

## 2019-07-25 NOTE — Telephone Encounter (Signed)
DOS: 07/31/2019  SURGICAL PROCEDURE: Arthrodesis Interphal Joint 2nd [phone]).  [employer] Medicare Effective 06/23/2019 -  Deductible is $0. Out of Pocket is $7,550 with $0 met and $7,550 remaining. CoInsurance is 80% / 20%.  Notification or Prior Authorization is not required for the requested services  This [employer] Advantage members plan does not currently require a prior authorization for these services. If you have general questions about the prior authorization requirements, please call us at [phone] or visit [email or web address] > Clinician Resources > Advance and Admission Notification Requirements. The number above acknowledges your notification. Please write this number down for future reference. Notification is not a guarantee of coverage or payment.  Decision ID #:[id number]  The number above acknowledges your inquiry and our response. Please write this number down and refer to it for future inquiries. Coverage and payment for an item or service is governed by the member's benefit plan document, and, if applicable, the provider's participation agreement with the Health Plan.

## 2019-07-25 NOTE — Progress Notes (Signed)
Subjective:  Patient ID: [name], female    DOB: [birth year],  MRN: [id number]  Chief Complaint  Patient presents with  . Toe Pain    pt is here for a right second toe injury, pt's right second toe is turned in an abduction. pt states that she hit it on a weight about 3 months prior, pt states that pain is elevated to the touch,     84 y.o. female presents with the above complaint.  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  She states that this has been going on for couple of months now.  She had a acute trauma 3 months ago where stubbed her toe against the weights that are in her house that caused it to heal and slightly malpositioned.  She states that she has tried all the various conservative therapy including toe protectors padding taping none of that has helped.  She would like to know if this could be surgically corrected.  She denies any other acute complaints.   Review of Systems: Negative except as noted in the HPI. Denies N/V/F/Ch.  Past Medical History:  Diagnosis Date  . Allergy   . Anemia   . Anxiety    takes Klonopin daily  as needed  . Arthritis   . Blood transfusion without reported diagnosis   . Cataract   . Diffuse cystic mastopathy   . GERD (gastroesophageal reflux disease)   . Headache   . History of colonoscopy   . Hyperlipidemia   . Macular degeneration (senile) of retina, unspecified   . Nontoxic uninodular goiter   . Palpitations    takes Sectral nightly  . Pre-diabetes   . Sickle cell trait ([place])   . Vertigo    takes Meclizine daily as needed    Current Outpatient Medications:  .  acebutolol (SECTRAL) 200 MG capsule, Take 1 capsule (200 mg total) by mouth 2 (two) times daily., Disp: 180 capsule, Rfl: 3 .  amlodipine-olmesartan (AZOR) 10-20 MG tablet, , Disp: , Rfl:  .  azelastine (OPTIVAR) 0.05 % ophthalmic solution, Apply 1 drop to eye 2 (two) times daily., Disp: , Rfl:   .  clonazePAM (KLONOPIN) 0.5 MG tablet, TAKE 1 TABLET BY MOUTH TWICE A DAY AS NEEDED FOR ANXIETY, Disp: 60 tablet, Rfl: 0 .  [employer] D3 5000 UNITS capsule, Take 5,000 Units by mouth once a week. , Disp: , Rfl: 2 .  esomeprazole (NEXIUM) 40 MG capsule, Take 40 mg by mouth 2 (two) times daily., Disp: , Rfl:  .  gentamicin (GARAMYCIN) 0.3 % ophthalmic solution, Place 1 drop into the left eye 4 (four) times daily., Disp: 5 mL, Rfl: 0 .  meclizine (ANTIVERT) 25 MG tablet, TAKE 1 TABLET BY MOUTH 3 TIMES A DAY AS NEEDED VERTIGO, Disp: 50 tablet, Rfl: 0 .  pantoprazole (PROTONIX) 40 MG tablet, TAKE 1 TABLET BY MOUTH ONCE DAILY FOR 30 DAYS, Disp: , Rfl:  .  PROAIR HFA 108 (90 Base) MCG/ACT inhaler, TAKE 2 PUFFS BY MOUTH 4 TIMES A DAY, Disp: , Rfl:  .  SYMBICORT 160-4.5 MCG/ACT inhaler, INHALE 2 PUFFS BY INHALATION ROUTE 2 TIMES PER DAY IN THE MORNING AND EVENING FOR 10 DAYS, Disp: , Rfl:   Social History   Tobacco Use  Smoking Status Never Smoker  Smokeless Tobacco Never Used    Allergies  Allergen Reactions  . Statins Other (See Comments)    Nose bleeds, muscle aches  .  Ibuprofen Other (See Comments)    Blurry vision  . Prednisone Palpitations    Oral tablet: Per pt: made her heart race   Objective:  There were no vitals filed for this visit. There is no height or weight on file to calculate BMI. Constitutional Well developed. Well nourished.  Vascular Dorsalis pedis pulses palpable bilaterally. Posterior tibial pulses palpable bilaterally. Capillary refill normal to all digits.  No cyanosis or clubbing noted. Pedal hair growth normal.  Neurologic Normal speech. Oriented to person, place, and time. Epicritic sensation to light touch grossly present bilaterally.  Dermatologic Nails well groomed and normal in appearance. No open wounds. No skin lesions.  Orthopedic:  Right second digit pain on palpation with slight abduction of the distal interphalangeal joint.  Rigid hammertoe  contracture at the PIPJ noted.  This is a nonreducible deformity.  Mild metatarsophalangeal joint contracture noted.  No plantar plate contracture noted or pain associated with it.   Radiographs: 3 views of skeletally mature adult right foot: Hammertoe contracture of the second digit noted with arthritic changes at the PIPJ joint and DIPJ joint.  No other bony deformities identified.  Mild arthritic changes noted at the first metatarsophalangeal joint.  Arthritic changes noted at the midfoot.  Mild calcaneal plantar heel spur noted.  Talonavicular joint arthritis noted Assessment:   1. Foot pain, right   2. Toe contracture, right   3. Hammertoe of second toe of right foot    Plan:  Patient was evaluated and treated and all questions answered.  Right second digit hammertoe contracture with possible MPJ contracture -I explained to the patient the etiology of hammertoe contracture in the setting of a previous/history of trauma and malposition of the digit itself.  I discussed in depth all the various treatment options available.  Given the patient has failed all conservative therapy, I believe patient will benefit from surgical intervention with PIPJ and DIPJ arthrodesis with screw fixation versus K wire with a possible MPJ second capsulotomy.  Patient states understanding would like to proceed with a surgical intervention. -I explained to the patient that this will help reduce the pain however I have not made any guarantees as to the outcome of the procedure.  Patient agrees with the plan would like to proceed with surgery. -Informed surgical risk consent was reviewed and read aloud to the patient.  I reviewed the films.  I have discussed my findings with the patient in great detail.  I have discussed all risks including but not limited to infection, stiffness, scarring, limp, disability, deformity, damage to blood vessels and nerves, numbness, poor healing, need for braces, arthritis, chronic pain,  amputation, death.  All benefits and realistic expectations discussed in great detail.  I have made no promises as to the outcome.  I have provided realistic expectations.  I have offered the patient a 2nd opinion, which they have declined and assured me they preferred to proceed despite the risks -I discussed with the patient postop protocol where she will be weightbearing as tolerated with a surgical shoe.  Surgical shoe was dispensed.   No follow-ups on file.

## 2019-08-01 ENCOUNTER — Telehealth: Payer: Self-pay | Admitting: *Deleted

## 2019-08-01 NOTE — Telephone Encounter (Signed)
"  I am calling for [employer].  She had surgery scheduled for this morning.  We have to reschedule it because she had her vaccination on Saturday and she's not feeling 100% well.  They told us that we needed to reach out to you to do that.  Please give me a call so I can get that rescheduled."

## 2019-08-02 NOTE — Telephone Encounter (Signed)
Spoke to patient, her surgery is rescheduled to 08/07/2019. Caren Griffins at [facility] was notified.

## 2019-08-09 ENCOUNTER — Encounter: Payer: Medicare Other | Admitting: Podiatry

## 2019-08-16 ENCOUNTER — Encounter: Payer: Medicare Other | Admitting: Podiatry

## 2019-08-21 ENCOUNTER — Encounter: Payer: Medicare Other | Admitting: Podiatry

## 2019-08-23 ENCOUNTER — Encounter: Payer: Medicare Other | Admitting: Podiatry

## 2019-09-04 ENCOUNTER — Encounter: Payer: Medicare Other | Admitting: Podiatry

## 2019-09-06 ENCOUNTER — Encounter: Payer: Medicare Other | Admitting: Podiatry

## 2020-01-02 ENCOUNTER — Other Ambulatory Visit: Payer: Self-pay | Admitting: General Practice

## 2020-01-02 DIAGNOSIS — R109 Unspecified abdominal pain: Secondary | ICD-10-CM

## 2020-01-02 DIAGNOSIS — K222 Esophageal obstruction: Secondary | ICD-10-CM

## 2020-01-16 ENCOUNTER — Other Ambulatory Visit: Payer: Medicare Other

## 2020-01-19 ENCOUNTER — Other Ambulatory Visit: Payer: Self-pay

## 2020-01-19 ENCOUNTER — Ambulatory Visit
Admission: RE | Admit: 2020-01-19 | Discharge: 2020-01-19 | Disposition: A | Discharge status: Home / Self Care | Payer: Medicare Other | Source: Ambulatory Visit | Attending: General Practice | Admitting: General Practice

## 2020-01-19 DIAGNOSIS — K222 Esophageal obstruction: Secondary | ICD-10-CM

## 2020-01-19 DIAGNOSIS — R109 Unspecified abdominal pain: Secondary | ICD-10-CM

## 2020-01-20 ENCOUNTER — Encounter (HOSPITAL_COMMUNITY): Payer: Self-pay

## 2020-01-20 ENCOUNTER — Emergency Department (HOSPITAL_COMMUNITY)
Admission: EM | Admit: 2020-01-20 | Discharge: 2020-01-20 | Disposition: A | Discharge status: Home / Self Care | Payer: Medicare Other | Attending: Emergency Medicine | Admitting: Emergency Medicine

## 2020-01-20 ENCOUNTER — Other Ambulatory Visit: Payer: Self-pay

## 2020-01-20 ENCOUNTER — Emergency Department (HOSPITAL_COMMUNITY): Payer: Medicare Other

## 2020-01-20 DIAGNOSIS — Y9301 Activity, walking, marching and hiking: Secondary | ICD-10-CM | POA: Diagnosis not present

## 2020-01-20 DIAGNOSIS — R519 Headache, unspecified: Secondary | ICD-10-CM | POA: Diagnosis not present

## 2020-01-20 DIAGNOSIS — Y9289 Other specified places as the place of occurrence of the external cause: Secondary | ICD-10-CM | POA: Diagnosis not present

## 2020-01-20 DIAGNOSIS — S42034A Nondisplaced fracture of lateral end of right clavicle, initial encounter for closed fracture: Secondary | ICD-10-CM

## 2020-01-20 DIAGNOSIS — W108XXA Fall (on) (from) other stairs and steps, initial encounter: Secondary | ICD-10-CM | POA: Insufficient documentation

## 2020-01-20 DIAGNOSIS — S4991XA Unspecified injury of right shoulder and upper arm, initial encounter: Secondary | ICD-10-CM | POA: Diagnosis present

## 2020-01-20 DIAGNOSIS — Z79899 Other long term (current) drug therapy: Secondary | ICD-10-CM | POA: Insufficient documentation

## 2020-01-20 DIAGNOSIS — W19XXXA Unspecified fall, initial encounter: Secondary | ICD-10-CM

## 2020-01-20 DIAGNOSIS — Y999 Unspecified external cause status: Secondary | ICD-10-CM | POA: Insufficient documentation

## 2020-01-20 MED ORDER — HYDROCODONE-ACETAMINOPHEN 5-325 MG PO TABS: 1.0000 | ORAL_TABLET | Freq: Four times a day (QID) | ORAL | 0 refills | Status: DC | PRN

## 2020-01-20 MED ORDER — HYDROCODONE-ACETAMINOPHEN 5-325 MG PO TABS
1.0000 | ORAL_TABLET | Freq: Once | ORAL | Status: AC
  Administered 2020-01-20: 1 via ORAL
  Filled 2020-01-20: qty 1

## 2020-01-20 NOTE — ED Triage Notes (Signed)
Pt BIB [facility] from home following mechanical fall down 5-6 steps. Pt c/o R hip and shoulder/clavical pain, hematoma noted to R forehead. Pt denies LOC, dizziness. Reporting some blurry vision. Pt hx HTN, VSS, A&Ox4, NAD noted

## 2020-01-20 NOTE — ED Provider Notes (Signed)
  Face-to-face evaluation   History: She is here for evaluation of injuries from fall down steps, felt to be mechanical, tripped and fell.  Physical exam: Alert, lucid.  Right chest is tender, laterally and below the right clavicle.  Right clavicle is not tender to palpation.  Mild tenderness base of left fourth toe, without deformity.  Patient is lucid and alert, conversant and knowledgeable.  Medical screening examination/treatment/procedure(s) were conducted as a shared visit with non-physician practitioner(s) and myself.  I personally evaluated the patient during the encounter    Daleen Bo, MD 01/20/20 2048

## 2020-01-20 NOTE — ED Notes (Signed)
Patient transported to CT 

## 2020-01-20 NOTE — ED Notes (Signed)
Ortho tech notified.  

## 2020-01-20 NOTE — Progress Notes (Signed)
Orthopedic Tech Progress Note Patient Details:  [name] 06-Mar-1936 [id number]  Ortho Devices Type of Ortho Device: Shoulder immobilizer Ortho Device/Splint Location: RUE Ortho Device/Splint Interventions: Application, Ordered   Post Interventions Patient Tolerated: Well Instructions Provided: Care of device, Adjustment of device   Kyra A Tye 01/20/2020, 6:26 PM

## 2020-01-20 NOTE — Discharge Instructions (Signed)
Take Norco as needed every 6 hours as needed for severe pain Take Tylenol for mild-moderate pain Please follow up with your primary care provider Return to the ER for worsening symptoms

## 2020-01-20 NOTE — ED Provider Notes (Signed)
[place] EMERGENCY DEPARTMENT Provider Note   CSN: [id number] Arrival date & time: 01/20/20  1532     History Chief Complaint  Patient presents with  . Fall    [name] is a 84 y.o. female who presents with a fall. She states she was going up a set of stairs carrying some items and lost her balance and fell backwards down about 7 stairs. She fell mostly on her right side. She was home alone and had to crawl to a chair. She lives with her daughter who is at bedside now. When family came home they called EMS. She reports a right sided headache, right sided neck pain, R shoulder/collarbone pain, R rib pain, and L foot pain. She has been ambulatory. She denies feeling lightheaded or dizzy prior to falling. There was no LOC. She has been seeing some white spots in her vision which has happened in the past with taking Prednisone. She is not on blood thinners.  HPI     Past Medical History:  Diagnosis Date  . Allergy   . Anemia   . Anxiety    takes Klonopin daily  as needed  . Arthritis   . Blood transfusion without reported diagnosis   . Cataract   . Diffuse cystic mastopathy   . GERD (gastroesophageal reflux disease)   . Headache   . History of colonoscopy   . Hyperlipidemia   . Macular degeneration (senile) of retina, unspecified   . Nontoxic uninodular goiter   . Palpitations    takes Sectral nightly  . Pre-diabetes   . Sickle cell trait ([place])   . Vertigo    takes Meclizine daily as needed    Patient Active Problem List   Diagnosis Date Noted  . Dieulafoy lesion of stomach   . Upper GI bleed   . Rectal bleed 11/27/2015  . Gastrointestinal bleeding 11/27/2015  . Black stools 11/27/2015  . Symptomatic anemia 11/27/2015  . Optic neuropathy 09/01/2015  . Multinodular goiter (nontoxic) 12/26/2012  . Palpitations 03/13/2012  . DJD (degenerative joint disease) of knee 12/14/2011  . Anemia, unspecified 11/13/2010  . GERD (gastroesophageal  reflux disease) 09/15/2010  . SVT/ PSVT/ PAT 08/07/2010  . Anxiety 06/26/2010  . Thoracic ascending aortic aneurysm ([place]) 05/30/2010  . HYPERLIPIDEMIA 02/20/2010  . THYROID NODULE 10/29/2007  . MACULAR DEGENERATION 10/29/2007  . Essential hypertension, benign 10/29/2007    Past Surgical History:  Procedure Laterality Date  . CATARACT EXTRACTION W/PHACO Right 04/03/2015   Procedure: CATARACT EXTRACTION PHACO AND INTRAOCULAR LENS PLACEMENT (IOC);  Surgeon: Marylynn Pearson, MD;  Location: [facility];  Service: Ophthalmology;  Laterality: Right;  . ESOPHAGOGASTRODUODENOSCOPY N/A 11/28/2015   Procedure: ESOPHAGOGASTRODUODENOSCOPY (EGD);  Surgeon: Gatha Mayer, MD;  Location: [facility] ENDOSCOPY;  Service: Endoscopy;  Laterality: N/A;  . TOTAL KNEE ARTHROPLASTY    . TRANSANAL EXCISION OF RECTAL MASS  07/2010   pathology: hemorrhoid.      OB History   No obstetric history on file.     Family History  Problem Relation Age of Onset  . Coronary artery disease Other   . Heart disease Sister   . Heart disease Brother   . Colon cancer Neg Hx   . Cancer Neg Hx   . Stroke Neg Hx   . Neuropathy Neg Hx   . Multiple sclerosis Neg Hx   . Migraines Neg Hx   . Esophageal cancer Neg Hx   . Rectal cancer Neg Hx   . Stomach  cancer Neg Hx     Social History   Tobacco Use  . Smoking status: Never Smoker  . Smokeless tobacco: Never Used  Vaping Use  . Vaping Use: Never used  Substance Use Topics  . Alcohol use: No  . Drug use: No    Home Medications Prior to Admission medications   Medication Sig Start Date End Date Taking? Authorizing Provider  acebutolol (SECTRAL) 200 MG capsule Take 1 capsule (200 mg total) by mouth 2 (two) times daily. 12/14/18   Miquel Dunn, NP  amlodipine-olmesartan (AZOR) 10-20 MG tablet  05/10/18   [provider]  azelastine (OPTIVAR) 0.05 % ophthalmic solution Apply 1 drop to eye 2 (two) times daily. 06/06/19   [provider]  clonazePAM  (KLONOPIN) 0.5 MG tablet TAKE 1 TABLET BY MOUTH TWICE A DAY AS NEEDED FOR ANXIETY 04/25/12   Janith Lima, MD  [employer] D3 5000 UNITS capsule Take 5,000 Units by mouth once a week.  03/20/15   [provider]  esomeprazole (NEXIUM) 40 MG capsule Take 40 mg by mouth 2 (two) times daily. 05/31/19   [provider]  gentamicin (GARAMYCIN) 0.3 % ophthalmic solution Place 1 drop into the left eye 4 (four) times daily. 04/30/19   Wardell Honour, MD  HYDROcodone-acetaminophen (NORCO/VICODIN) 5-325 MG tablet Take 1 tablet by mouth every 6 (six) hours as needed. 01/20/20   Recardo Evangelist, PA-C  meclizine (ANTIVERT) 25 MG tablet TAKE 1 TABLET BY MOUTH 3 TIMES A DAY AS NEEDED VERTIGO 09/28/11   Janith Lima, MD  pantoprazole (PROTONIX) 40 MG tablet TAKE 1 TABLET BY MOUTH ONCE DAILY FOR 30 DAYS 02/22/19   [provider]  PROAIR HFA 108 (90 Base) MCG/ACT inhaler TAKE 2 PUFFS BY MOUTH 4 TIMES A DAY 07/03/18   [provider]  SYMBICORT 160-4.5 MCG/ACT inhaler INHALE 2 PUFFS BY INHALATION ROUTE 2 TIMES PER DAY IN THE MORNING AND EVENING FOR 10 DAYS 02/10/19   [provider]    Allergies    Statins, Ibuprofen, and Prednisone  Review of Systems   Review of Systems  Constitutional: Negative for fever.  Eyes: Positive for visual disturbance.  Respiratory: Negative for shortness of breath.   Cardiovascular: Positive for chest pain (rib).  Gastrointestinal: Negative for abdominal pain.  Musculoskeletal: Positive for arthralgias, back pain, myalgias and neck pain.  Neurological: Positive for headaches. Negative for dizziness.  All other systems reviewed and are negative.   Physical Exam Updated Vital Signs BP 126/78   Pulse 73   Temp 97.9 F (36.6 C) (Oral)   Resp 18   Ht 5\' 6"  (1.676 m)   Wt 80.3 kg   SpO2 93%   BMI 28.57 kg/m   Physical Exam Vitals and nursing note reviewed.  Constitutional:      General: She is not in acute distress.    Appearance:  Normal appearance. She is well-developed. She is not ill-appearing.  HENT:     Head: Normocephalic and atraumatic.  Eyes:     General: No scleral icterus.       Right eye: No discharge.        Left eye: No discharge.     Conjunctiva/sclera: Conjunctivae normal.     Pupils: Pupils are equal, round, and reactive to light.  Neck:     Comments: Right cervical paraspinal muscle tenderness Cardiovascular:     Rate and Rhythm: Normal rate and regular rhythm.  Pulmonary:     Effort: Pulmonary  effort is normal. No respiratory distress.     Breath sounds: Normal breath sounds.  Chest:     Chest wall: Tenderness (right lateral rib cage tenderness) present.  Abdominal:     General: There is no distension.  Musculoskeletal:     Cervical back: Normal range of motion.     Comments: Right shoulder: Tenderness over posterior shoulder and distal clavicle. FROM of the elbow, wrist, hand. 2+ radial pulse  RLE: Normal ROM of the hip, knee, ankle  LLE: Normal ROM of the hip, knee, ankle. There is tenderness over the medial aspect of the foot with mild swelling and bruising. 2+ DP pulse  Ambulatory with assistance    Skin:    General: Skin is warm and dry.  Neurological:     Mental Status: She is alert and oriented to person, place, and time.  Psychiatric:        Behavior: Behavior normal.     ED Results / Procedures / Treatments   Labs (all labs ordered are listed, but only abnormal results are displayed) Labs Reviewed - No data to display  EKG None  Radiology CT ABDOMEN PELVIS WO CONTRAST  Result Date: 01/19/2020 CLINICAL DATA:  Patient reports feeling bad all the time. History of IMA aneurysm. EXAM: CT ABDOMEN AND PELVIS WITHOUT CONTRAST TECHNIQUE: Multidetector CT imaging of the abdomen and pelvis was performed following the standard protocol without IV contrast. COMPARISON:  08/08/2017. FINDINGS: Lower chest: Mild cardiomegaly.  Lung bases are clear. Hepatobiliary: No focal liver  abnormality is seen. No gallstones, gallbladder wall thickening, or biliary dilatation. Pancreas: Unremarkable. No pancreatic ductal dilatation or surrounding inflammatory changes. Spleen: Normal in size without focal abnormality. Adrenals/Urinary Tract: Unremarkable adrenal glands. Simple 2.3 cm upper pole right renal cyst additional low-density lesion within the inferior pole of the right kidney is too small to definitively characterize. Kidneys have an otherwise unremarkable noncontrast appearance. No hydronephrosis. Urinary bladder is mildly circumferentially thickened. Stomach/Bowel: Marked diffuse gastric wall thickening. Unchanged metallic clip within the gastric body. Enteric contrast is seen throughout the bowel. There are no dilated loops of bowel to suggest obstruction. No focal colonic wall thickening or inflammatory changes. Vascular/Lymphatic: Scattered atherosclerotic calcification of the aortoiliac axis. 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 No abdominopelvic lymphadenopathy identified. Reproductive: Uterus and adnexa within normal limits. Other: No free fluid.  No free air. Musculoskeletal: Degenerative changes of the bilateral hips as well as the right SI joint and pubic symphysis. No acute osseous abnormality. IMPRESSION: 1. Marked gastric wall thickening, which may represent gastritis. Consider endoscopic evaluation as a neoplastic process could also have a similar appearance. 2. Mild circumferential urinary bladder wall thickening. Correlate with urinalysis to exclude cystitis. 3. 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 4. Aortic atherosclerosis. (ICD10-I70.0). Electronically Signed   By: Davina Poke D.O.   On: 01/19/2020 14:51   DG Ribs Unilateral W/Chest Right  Result Date:  01/20/2020 CLINICAL DATA:  Post fall with right-sided pain. Right shoulder, clavicle, and rib pain. EXAM: RIGHT RIBS AND CHEST - 3+ VIEW COMPARISON:  Chest radiograph 08/07/2017. FINDINGS: No fracture or other bone lesions are seen involving the ribs. There is no evidence of pneumothorax or pleural effusion. Possible nondisplaced distal right clavicle fracture. Both lungs are clear. Aortic atherosclerosis. Stable mediastinal contours. IMPRESSION: 1. No acute rib fracture  or pulmonary complication. 2. Possible nondisplaced distal right clavicle fracture. Electronically Signed   By: Keith Rake M.D.   On: 01/20/2020 17:46   DG Clavicle Right  Result Date: 01/20/2020 CLINICAL DATA:  Post fall with right-sided pain. Right shoulder, clavicle, and rib pain. EXAM: RIGHT CLAVICLE - 2+ VIEWS COMPARISON:  None. FINDINGS: Possible nondisplaced distal clavicle fracture. The acromioclavicular joint remains congruent. Medial clavicles intact. IMPRESSION: Possible nondisplaced distal clavicle fracture. Electronically Signed   By: Keith Rake M.D.   On: 01/20/2020 17:49   DG Shoulder Right  Result Date: 01/20/2020 CLINICAL DATA:  Post fall with right-sided pain. Right shoulder, clavicle, and rib pain. EXAM: RIGHT SHOULDER - 2+ VIEW COMPARISON:  None. FINDINGS: Equivocal nondisplaced distal clavicle fracture. Normal acromioclavicular alignment with degenerative change. No additional fracture of the shoulder. Moderate glenohumeral osteoarthritis. Superior subluxation of the humeral head abutting the undersurface of the acromion consistent with chronic rotator cuff arthropathy. IMPRESSION: 1. Equivocal nondisplaced distal clavicle fracture. 2. No additional fracture of the shoulder. Moderate glenohumeral osteoarthritis. Chronic rotator cuff arthropathy. Electronically Signed   By: Keith Rake M.D.   On: 01/20/2020 17:44   CT Head Wo Contrast  Result Date: 01/20/2020 CLINICAL DATA:  Mechanical fall down 5-6  steps, right hip, shoulder and clavicular pain, right forehead hematoma, denies loss of consciousness. PE EXAM: CT HEAD WITHOUT CONTRAST CT CERVICAL SPINE WITHOUT CONTRAST TECHNIQUE: Multidetector CT imaging of the head and cervical spine was performed following the standard protocol without intravenous contrast. Multiplanar CT image reconstructions of the cervical spine were also generated. COMPARISON:  Thyroid ultrasound 03/27/2019, CT head 09/08/2015 FINDINGS: CT HEAD FINDINGS Brain: No evidence of acute infarction, hemorrhage, hydrocephalus, extra-axial collection or mass lesion/mass effect. Symmetric prominence of the ventricles, cisterns and sulci compatible with parenchymal volume loss. Patchy areas of white matter hypoattenuation are most compatible with chronic microvascular angiopathy. Vascular: Atherosclerotic calcification of the carotid siphons and intradural vertebral arteries. No hyperdense vessel. Skull: Mild right frontal scalp thickening. No acute calvarial fracture or suspicious osseous lesions. No other significant scalp swelling. Sinuses/Orbits: Paranasal sinuses and mastoid air cells are predominantly clear. Orbital structures are unremarkable aside from prior lens extractions. Other: None CT CERVICAL SPINE FINDINGS Alignment: Slight reversal the normal cervical lordosis with an apex at the C5 level. Retrolisthesis C6 on 7 likely on a spondylitic basis with degenerative changes at this level. Bony fusion across the C3-C5 vertebral bodies, left C3-C5 facet and right C3-4 facet. No evidence of traumatic listhesis. No abnormally widened, perched or jumped facets. Normal alignment of the craniocervical and atlantoaxial articulations accounting for leftward cranial rotation. Skull base and vertebrae: Bony fusion C3-C5, as above. No acute skull base fracture. No vertebral body fracture or height loss. Normal bone mineralization. No worrisome osseous lesions. Soft tissues and spinal canal: No pre or  paravertebral fluid or swelling. No visible canal hematoma. Disc levels: Multilevel intervertebral disc height loss with spondylitic endplate changes. Predominantly central disc osteophyte complexes C3-C6 result in at most moderate canal stenosis maximal C6-7. Additional multilevel uncinate spurring and facet hypertrophic changes result in multilevel mild-to-moderate foraminal narrowing with more moderate to severe narrowing on the left at C4-5 and bilaterally C5-6. Upper chest: No acute abnormality in the upper chest or imaged lung apices. Atherosclerotic calcification in the cervical carotids, aortic arch and proximal great vessels. Other: Subcentimeter partially calcified nodule in the left thyroid gland. Previously benign thyroid ultrasound 03/27/2019. No further imaging warranted in a patient of this age. This follows consensus guidelines:  Managing Incidental Thyroid Nodules Detected on Imaging: White Paper of the ACR Incidental Thyroid Findings Committee. J Am Coll Radiol 2015; 12:143-150. and [employer] 3-tiered system for managing ITNs: J Am Coll Radiol. 2015; Feb;12(2): 143-50 IMPRESSION: 1. No acute intracranial abnormality. Mild right frontal scalp thickening. No calvarial fracture. 2. Mild parenchymal volume loss and chronic microvascular ischemic white matter disease. 3. No acute cervical spine fracture or traumatic listhesis. 4. Bony fusion across the C3-C5 vertebral bodies, left C3-C5 facet and right C3-4 facet. 5. Multilevel degenerative changes of the cervical spine as described above. 6. Aortic Atherosclerosis (ICD10-I70.0). Cervical and intracranial atherosclerosis. Electronically Signed   By: Lovena Le M.D.   On: 01/20/2020 17:36   CT Cervical Spine Wo Contrast  Result Date: 01/20/2020 CLINICAL DATA:  Mechanical fall down 5-6 steps, right hip, shoulder and clavicular pain, right forehead hematoma, denies loss of consciousness. PE EXAM: CT HEAD WITHOUT CONTRAST CT CERVICAL SPINE WITHOUT CONTRAST  TECHNIQUE: Multidetector CT imaging of the head and cervical spine was performed following the standard protocol without intravenous contrast. Multiplanar CT image reconstructions of the cervical spine were also generated. COMPARISON:  Thyroid ultrasound 03/27/2019, CT head 09/08/2015 FINDINGS: CT HEAD FINDINGS Brain: No evidence of acute infarction, hemorrhage, hydrocephalus, extra-axial collection or mass lesion/mass effect. Symmetric prominence of the ventricles, cisterns and sulci compatible with parenchymal volume loss. Patchy areas of white matter hypoattenuation are most compatible with chronic microvascular angiopathy. Vascular: Atherosclerotic calcification of the carotid siphons and intradural vertebral arteries. No hyperdense vessel. Skull: Mild right frontal scalp thickening. No acute calvarial fracture or suspicious osseous lesions. No other significant scalp swelling. Sinuses/Orbits: Paranasal sinuses and mastoid air cells are predominantly clear. Orbital structures are unremarkable aside from prior lens extractions. Other: None CT CERVICAL SPINE FINDINGS Alignment: Slight reversal the normal cervical lordosis with an apex at the C5 level. Retrolisthesis C6 on 7 likely on a spondylitic basis with degenerative changes at this level. Bony fusion across the C3-C5 vertebral bodies, left C3-C5 facet and right C3-4 facet. No evidence of traumatic listhesis. No abnormally widened, perched or jumped facets. Normal alignment of the craniocervical and atlantoaxial articulations accounting for leftward cranial rotation. Skull base and vertebrae: Bony fusion C3-C5, as above. No acute skull base fracture. No vertebral body fracture or height loss. Normal bone mineralization. No worrisome osseous lesions. Soft tissues and spinal canal: No pre or paravertebral fluid or swelling. No visible canal hematoma. Disc levels: Multilevel intervertebral disc height loss with spondylitic endplate changes. Predominantly central  disc osteophyte complexes C3-C6 result in at most moderate canal stenosis maximal C6-7. Additional multilevel uncinate spurring and facet hypertrophic changes result in multilevel mild-to-moderate foraminal narrowing with more moderate to severe narrowing on the left at C4-5 and bilaterally C5-6. Upper chest: No acute abnormality in the upper chest or imaged lung apices. Atherosclerotic calcification in the cervical carotids, aortic arch and proximal great vessels. Other: Subcentimeter partially calcified nodule in the left thyroid gland. Previously benign thyroid ultrasound 03/27/2019. No further imaging warranted in a patient of this age. This follows consensus guidelines: Managing Incidental Thyroid Nodules Detected on Imaging: White Paper of the ACR Incidental Thyroid Findings Committee. J Am Coll Radiol 2015; 12:143-150. and [facility] 3-tiered system for managing ITNs: J Am Coll Radiol. 2015; Feb;12(2): 143-50 IMPRESSION: 1. No acute intracranial abnormality. Mild right frontal scalp thickening. No calvarial fracture. 2. Mild parenchymal volume loss and chronic microvascular ischemic white matter disease. 3. No acute cervical spine fracture or traumatic listhesis. 4. Bony fusion across the  C3-C5 vertebral bodies, left C3-C5 facet and right C3-4 facet. 5. Multilevel degenerative changes of the cervical spine as described above. 6. Aortic Atherosclerosis (ICD10-I70.0). Cervical and intracranial atherosclerosis. Electronically Signed   By: Lovena Le M.D.   On: 01/20/2020 17:36   DG Foot Complete Left  Result Date: 01/20/2020 CLINICAL DATA:  Post fall with left foot pain. Pain medially in the midfoot. EXAM: LEFT FOOT - COMPLETE 3+ VIEW COMPARISON:  None. FINDINGS: Questionable nondisplaced fracture of the fourth toe proximal phalanx. No other fracture of the foot. Pes planus with moderately advanced hindfoot degenerative change. Well corticated density adjacent to the dorsal talus may be represent prominent talar  ridge or sequela of remote injury. There is a plantar calcaneal spur. Hammertoe deformity of the digits. Mild soft tissue edema. IMPRESSION: 1. Questionable nondisplaced fourth toe proximal phalanx. Recommend correlation with focal tenderness. 2. Pes planus with moderately advanced hindfoot degenerative change. Electronically Signed   By: Keith Rake M.D.   On: 01/20/2020 17:48    Procedures Procedures (including critical care time)  Medications Ordered in ED Medications  HYDROcodone-acetaminophen (NORCO/VICODIN) 5-325 MG per tablet 1 tablet (1 tablet Oral Given 01/20/20 1648)    ED Course  I have reviewed the triage vital signs and the nursing notes.  Pertinent labs & imaging results that were available during my care of the patient were reviewed by me and considered in my medical decision making (see chart for details).  84 year old female presents with a mechanical fall down 7 steps today. Her vitals are reassuring. She has multiple ares of pain but the worse is over the right clavicle. Will obtain CT head, C-spine as well as xrays of the shoulder, clavicle, ribs, and L foot.  Imaging shows possible non-displaced fracture of the distal right clavicle and a left 4th toe fracture. Shared visit with Dr. Eulis Foster. Pt placed in a sling and will rx short course of pain medicine. Patient and daughter verbalized understanding of the plan.  MDM Rules/Calculators/A&P                           Final Clinical Impression(s) / ED Diagnoses Final diagnoses:  Fall, initial encounter  Closed nondisplaced fracture of acromial end of right clavicle, initial encounter    Rx / DC Orders ED Discharge Orders         Ordered    HYDROcodone-acetaminophen (NORCO/VICODIN) 5-325 MG tablet  Every 6 hours PRN     Discontinue  Reprint     01/20/20 1915           Recardo Evangelist, PA-C 01/20/20 2034    Daleen Bo, MD 01/20/20 2047

## 2020-01-26 ENCOUNTER — Ambulatory Visit: Payer: Medicare Other | Admitting: Podiatry

## 2020-01-30 ENCOUNTER — Other Ambulatory Visit: Payer: Self-pay | Admitting: Cardiology

## 2020-01-30 DIAGNOSIS — I712 Thoracic aortic aneurysm, without rupture: Secondary | ICD-10-CM

## 2020-01-30 DIAGNOSIS — I7121 Aneurysm of the ascending aorta, without rupture: Secondary | ICD-10-CM

## 2020-03-01 ENCOUNTER — Ambulatory Visit: Payer: Medicare Other | Admitting: Cardiology

## 2020-03-01 ENCOUNTER — Other Ambulatory Visit: Payer: Self-pay

## 2020-03-01 ENCOUNTER — Encounter: Payer: Self-pay | Admitting: Cardiology

## 2020-03-01 VITALS — BP 118/70 | HR 69 | Resp 16 | Ht 66.0 in | Wt 176.8 lb

## 2020-03-01 DIAGNOSIS — I712 Thoracic aortic aneurysm, without rupture: Secondary | ICD-10-CM

## 2020-03-01 DIAGNOSIS — I10 Essential (primary) hypertension: Secondary | ICD-10-CM

## 2020-03-01 DIAGNOSIS — I7121 Aneurysm of the ascending aorta, without rupture: Secondary | ICD-10-CM

## 2020-03-01 DIAGNOSIS — I351 Nonrheumatic aortic (valve) insufficiency: Secondary | ICD-10-CM

## 2020-03-01 DIAGNOSIS — I728 Aneurysm of other specified arteries: Secondary | ICD-10-CM

## 2020-03-01 NOTE — Progress Notes (Signed)
Subjective:  Primary Physician/Referring:  Andree Moro, DO  Patient ID: [name], female    DOB: [birth year], 84 y.o.   MRN: [id number]  Chief Complaint  Patient presents with  . Follow-up    1 year  . Thoracic Aortic Aneurysm    Mesenteric aneurysm  . Palpitations    HPI: [name]  is a 84 y.o. female  with  Hypertension, hyperglycemia, chronic palpitations and event and Holter monitor in 2014 and 2019 revealing occasional PAC and PVC,  hyperlipidemia, asymptomatic thoracic aortic aneurysm of moderate size and also inferior mesenteric aneurysm presents for annual visit. She has multiple medication intolerances including statins.  Thoracic aortic aneurysm incidentally detected by CT scan of the chest performed in 2014 at 4.7 cm and has remained stable by last CTA in 2018, also has been well correlated with echocardiogram findings.   States that since being on acebutolol, symptoms of palpitations are essentially resolved and blood pressure is also very well controlled. She has not had any chest pain and states that she is doing well.  In July 2021, she had an accidental fall leading to fracture of her clavicle and blunt chest trauma on the right and also right foot.  She is still recuperating from this.  She is wondering if there is any change in her aorta.  Past Medical History:  Diagnosis Date  . Allergy   . Anemia   . Anxiety    takes Klonopin daily  as needed  . Arthritis   . Blood transfusion without reported diagnosis   . Cataract   . Diffuse cystic mastopathy   . GERD (gastroesophageal reflux disease)   . Headache   . History of colonoscopy   . Hyperlipidemia   . Macular degeneration (senile) of retina, unspecified   . Nontoxic uninodular goiter   . Palpitations    takes Sectral nightly  . Pre-diabetes   . Sickle cell trait ([place])   . Vertigo    takes Meclizine daily as needed    Past Surgical History:  Procedure Laterality Date  . CATARACT EXTRACTION  W/PHACO Right 04/03/2015   Procedure: CATARACT EXTRACTION PHACO AND INTRAOCULAR LENS PLACEMENT (IOC);  Surgeon: Marylynn Pearson, MD;  Location: [place];  Service: Ophthalmology;  Laterality: Right;  . ESOPHAGOGASTRODUODENOSCOPY N/A 11/28/2015   Procedure: ESOPHAGOGASTRODUODENOSCOPY (EGD);  Surgeon: Gatha Mayer, MD;  Location: [facility] ENDOSCOPY;  Service: Endoscopy;  Laterality: N/A;  . TOTAL KNEE ARTHROPLASTY    . TRANSANAL EXCISION OF RECTAL MASS  07/2010   pathology: hemorrhoid.     Social History   Socioeconomic History  . Marital status: Divorced    Spouse name: Not on file  . Number of children: 2  . Years of education: 15  . Highest education level: Not on file  Occupational History  . Not on file  Tobacco Use  . Smoking status: Never Smoker  . Smokeless tobacco: Never Used  Vaping Use  . Vaping Use: Never used  Substance and Sexual Activity  . Alcohol use: No  . Drug use: No  . Sexual activity: Never  Other Topics Concern  . Not on file  Social History Narrative   Lives with daughter   Caffeine use: none   Regular Exercise -  NO      Social Determinants of Health   Financial Resource Strain:   . Difficulty of Paying Living Expenses: Not on file  Food Insecurity:   . Worried About Charity fundraiser in the  Last Year: Not on file  . Ran Out of Food in the Last Year: Not on file  Transportation Needs:   . Lack of Transportation (Medical): Not on file  . Lack of Transportation (Non-Medical): Not on file  Physical Activity:   . Days of Exercise per Week: Not on file  . Minutes of Exercise per Session: Not on file  Stress:   . Feeling of Stress : Not on file  Social Connections:   . Frequency of Communication with Friends and Family: Not on file  . Frequency of Social Gatherings with Friends and Family: Not on file  . Attends Religious Services: Not on file  . Active Member of Clubs or Organizations: Not on file  . Attends Archivist Meetings: Not on file  .  Marital Status: Not on file  Intimate Partner Violence:   . Fear of Current or Ex-Partner: Not on file  . Emotionally Abused: Not on file  . Physically Abused: Not on file  . Sexually Abused: Not on file    Current Outpatient Medications on File Prior to Visit  Medication Sig Dispense Refill  . acebutolol (SECTRAL) 200 MG capsule TAKE 1 CAPSULE BY MOUTH  TWICE DAILY 180 capsule 3  . amlodipine-olmesartan (AZOR) 10-20 MG tablet daily.     Marland Kitchen azithromycin (ZITHROMAX) 250 MG tablet Take 250 mg by mouth as directed.    . clonazePAM (KLONOPIN) 0.5 MG tablet TAKE 1 TABLET BY MOUTH TWICE A DAY AS NEEDED FOR ANXIETY 60 tablet 0  . [facility] D3 5000 UNITS capsule Take 5,000 Units by mouth once a week.   2  . esomeprazole (NEXIUM) 40 MG capsule Take 40 mg by mouth 2 (two) times daily.    Marland Kitchen HYDROcodone-acetaminophen (NORCO/VICODIN) 5-325 MG tablet Take 1 tablet by mouth every 6 (six) hours as needed. 10 tablet 0  . meclizine (ANTIVERT) 25 MG tablet TAKE 1 TABLET BY MOUTH 3 TIMES A DAY AS NEEDED VERTIGO 50 tablet 0  . Oxymetazoline HCl (NASAL SPRAY) 0.05 % SOLN No Drip Nasal Mist 0.05 %  INHALE 2 SPRAYS BY NASAL ROUTE TWICE DAILY FOR 3 DAYS    . PROAIR HFA 108 (90 Base) MCG/ACT inhaler TAKE 2 PUFFS BY MOUTH 4 TIMES A DAY    . azelastine (OPTIVAR) 0.05 % ophthalmic solution Apply 1 drop to eye 2 (two) times daily. (Patient not taking: Reported on 03/01/2020)    . gentamicin (GARAMYCIN) 0.3 % ophthalmic solution Place 1 drop into the left eye 4 (four) times daily. (Patient not taking: Reported on 03/01/2020) 5 mL 0  . pantoprazole (PROTONIX) 40 MG tablet TAKE 1 TABLET BY MOUTH ONCE DAILY FOR 30 DAYS (Patient not taking: Reported on 03/01/2020)    . SYMBICORT 160-4.5 MCG/ACT inhaler INHALE 2 PUFFS BY INHALATION ROUTE 2 TIMES PER DAY IN THE MORNING AND EVENING FOR 10 DAYS (Patient not taking: INHALE 2 PUFFS BY INHALATION ROUTE 2 TIMES PER DAY IN THE MORNING AND EVENING FOR 10 DAYS)     No current  facility-administered medications on file prior to visit.    Review of Systems  Cardiovascular: Positive for chest pain (right upper chest). Negative for dyspnea on exertion and leg swelling.  Musculoskeletal: Positive for joint pain (right shoulder and clavicle).  Gastrointestinal: Negative for melena.      Objective:  Blood pressure 118/70, pulse 69, resp. rate 16, height $RemoveBe'5\' 6"'fgJlhvjPd$  (1.676 m), weight 176 lb 12.8 oz (80.2 kg), SpO2 94 %. Body mass index is 28.54 kg/m.  Vitals with BMI 03/01/2020 01/20/2020 01/20/2020  Height $Remov'5\' 6"'kozfbF$  - -  Weight 176 lbs 13 oz - -  BMI 69.48 - -  Systolic 546 270 -  Diastolic 70 78 -  Pulse 69 73 66     Physical Exam Constitutional:      General: She is not in acute distress.    Appearance: She is well-developed.  Neck:     Thyroid: No thyromegaly.     Vascular: No JVD.  Cardiovascular:     Rate and Rhythm: Normal rate and regular rhythm.     Pulses: Normal pulses and intact distal pulses.     Heart sounds: S1 normal and S2 normal. Murmur heard.  Harsh early systolic murmur is present with a grade of 2/6 at the upper right sternal border radiating to the neck. High-pitched blowing decrescendo early diastolic murmur is present with a grade of 2/4 at the upper right sternal border radiating to the apex.  No gallop.      Comments: No JVD. No pedal edema. Pulmonary:     Effort: Pulmonary effort is normal.     Breath sounds: Normal breath sounds.  Abdominal:     General: Bowel sounds are normal.     Palpations: Abdomen is soft.  Skin:    General: Skin is warm and dry.     Radiology: No results found.  Laboratory Examination: 06/29/2017: Creatinine 0.74, EGFR 89, potassium 3.9, CMP normal.  RBC 3.68, normal H&H, CBC otherwise normal.  CMP Latest Ref Rng & Units 08/07/2017 11/27/2015 11/27/2015  Glucose 65 - 99 mg/dL 109(H) 90 101(H)  BUN 6 - 20 mg/dL $Remove'10 13 19  'ifAmHlO$ Creatinine 0.44 - 1.00 mg/dL 0.79 0.65 0.77  Sodium 135 - 145 mmol/L 137 139 140  Potassium  3.5 - 5.1 mmol/L 3.9 3.8 4.0  Chloride 101 - 111 mmol/L 104 111 111  CO2 22 - 32 mmol/L $RemoveB'22 23 23  'ZlfarWnN$ Calcium 8.9 - 10.3 mg/dL 9.5 8.6(L) 8.8(L)  Total Protein 6.0 - 8.5 g/dL - - -  Total Bilirubin 0.0 - 1.2 mg/dL - - -  Alkaline Phos 39 - 117 IU/L - - -  AST 0 - 40 IU/L - - -  ALT 0 - 32 IU/L - - -   CBC Latest Ref Rng & Units 08/07/2017 11/30/2015 11/29/2015  WBC 4.0 - 10.5 K/uL 6.3 5.3 5.2  Hemoglobin 12.0 - 15.0 g/dL 12.6 8.7(L) 8.8(L)  Hematocrit 36 - 46 % 36.4 25.9(L) 26.2(L)  Platelets 150 - 400 K/uL 192 182 164   Lipid Panel     Component Value Date/Time   CHOL 203 (H) 08/28/2015 1604   TRIG 172 (H) 08/28/2015 1604   HDL 57 08/28/2015 1604   CHOLHDL 3.6 08/28/2015 1604   CHOLHDL 3 06/24/2011 0911   VLDL 17.0 06/24/2011 0911   LDLCALC 112 (H) 08/28/2015 1604   LDLDIRECT 139.2 04/15/2011 0908   HEMOGLOBIN A1C Lab Results  Component Value Date   HGBA1C 5.7 (H) 08/28/2015   MPG 117 (H) 03/18/2010   TSH No results for input(s): TSH in the last 8760 hours.   Radiology:  CT angiogram chest 08/08/2017: There is an ascending aortic aneurysm with diameter of 4.4 cm, unchanged. The main pulmonary artery remains dilated with diameter  of 3.6 cm. Comparision:  09/30/2016: Unchanged aneurysmal enlargement of the ascending aorta measuring 46 mm.  Recommend semiannual imaging by CTA or MRA.  Chronic dilatation of the pulmonary arterial tree as seen with pulmonary hypertension. No significant change  from CT angiogram chest and abdomen: 10/07/2015,  02/02/2013:  4.7 cm.  CT of the abdomen and pelvis 01/20/2020: Known inferior mesenteric artery aneurysm measures approximately 9 x 7 mm, previously measured approximately 8 x 6 mm on 08/08/2017. 4. Aortic atherosclerosis. (ICD10-I70.0)   Cardiac studies:   Event monitor 09/07/2017-09/21/2017: Normal sinus rhythm with rare PAC. 2 patient triggered events for fluttered/skipped beats revealed normal sinus rhythm with one PAC. 2 patient triggered  events for fluttered/skipped beats revealed normal sinus rhythm and PAC. No othersignificant arrhythmia noted. Minimal heart rate 59 bpm and maxillary 85 bpm.  Holter Monitor 48 hours 06/23/2012: Predominant rhythm is sinus rhythm. Occasional PVC and PAC noted.   Lexiscan myoview stress 12/09/12:  1. Resting EKG NSR, Poor R wave progression. Stress EKG was non diagnostic for ischemia. No ST-T changes of ischemia noted with pharmacologic stress testing. Stress symptoms included shortness of breath and light headedness.  2. The perfusion study demonstrated normal isotope uptake both at rest and stress. There was no evidence of ischemia or scar. Dynamic gated images reveal normal wall motion and endocardial thickening. Left ventricular ejection fraction was estimated to be 71%.  Echocardiogram 01/09/2019:  Left ventricle cavity is normal in size. Moderate concentric hypertrophy  of the left ventricle. Normal global wall motion. Normal LV systolic  function with EF 55%. Doppler evidence of grade I (impaired) diastolic  dysfunction, normal LAP. Calculated EF 55%.  Left atrial cavity is normal in size. Aneurysmal interatrial septum  without 2D or color Doppler evidence of interatrial shunt.  Trileaflet aortic valve. Mild to moderate aortic regurgitation.  Mild (Grade I) mitral regurgitation.  Mild tricuspid regurgitation. Estimated pulmonary artery systolic pressure  is 21 mmHg.  Mild pulmonic regurgitation.  The aortic root is dilated, measuring 4.2 cm, with proximal ascending  dilated aorta measuring 4.5 cm.  No significant change compared to previous study dated 07/19/2017.  EKG:  EKG 03/01/2020: Sinus rhythm with first-degree AV block at rate of 72 bpm, normal axis.  No evidence of ischemia, otherwise normal EKG. compared to 07/01/2017, left anterior fascicular block not present.  Assessment:      ICD-10-CM   1. Thoracic ascending aortic aneurysm (HCC)  I71.2 PCV ECHOCARDIOGRAM COMPLETE  2.  Moderate aortic regurgitation  I35.1   3. Aneurysm of mesenteric artery (HCC) - Inferior mesenteric artery  I72.8   4. Essential hypertension, benign  I10 EKG 12-Lead    PCV ECHOCARDIOGRAM COMPLETE   Recommendations:    RYLANN MUNFORD  is a  84 y.o.   female  with  Hypertension, hyperglycemia, palpitations due to occasional PAC and PVC,  hyperlipidemia, asymptomatic thoracic aortic aneurysm of moderate size with moderate aortic regurgitation and also inferior mesenteric aneurysm presents for annual visit. She has multiple medication intolerances including statins.  Thoracic aortic aneurysm incidentally detected by CT scan of the chest performed in 2014 at 4.7 cm and has remained stable by last CTA in 2018, also has been well correlated with echocardiogram findings.   Patient states that she is doing remarkably well since being on  Acebutalol, blood pressure has been very well controlled.  I also reviewed her CT angiogram of the abdomen performed when she had a fall in July 2021 and also reviewed her labs.  There was no change noted.  I also reviewed the results of the recently performed echocardiogram, there is a good correlation between echocardiogram and the CT scan.  I will repeat the annual echocardiogram to follow-up on the ascending aortic aneurysm  and AI.  She also has a moderate-sized eccentric aneurysm that appears to have slightly enlarged.  We discussed regarding very low risk of rupture and signs and symptoms of rupture in detail and to go to the emergency room if she ever had severe abdominal discomfort.  Risks and complications from using a covered stent and restenosis risk discussed with the patient.  I will repeat CT scan probably next year to follow-up on the aneurysm.   I reviewed literature regarding rare visceral artery aneurysm. Indications for surgery/endovascular repair discssed.  It should be considered in all patients with symptoms related to the aneurysms, if the aneurysm is  more than 2 cm in diameter, if the patient is pregnant,  >2.5 to 3 cm in elderly patients or if there is demonstrated growth of the aneurysm.   I would like to see her back in 12 months in the office.  40 minute OV encounter in evaluation of her complex cardiovascular issues and review of records.    Adrian Prows, MD, [facility] 03/02/2020, 8:10 AM Office: [phone]

## 2020-03-15 ENCOUNTER — Other Ambulatory Visit: Payer: Self-pay

## 2020-03-15 ENCOUNTER — Ambulatory Visit: Payer: Medicare Other

## 2020-03-15 DIAGNOSIS — I10 Essential (primary) hypertension: Secondary | ICD-10-CM

## 2020-03-15 DIAGNOSIS — I712 Thoracic aortic aneurysm, without rupture: Secondary | ICD-10-CM

## 2020-03-15 DIAGNOSIS — I7121 Aneurysm of the ascending aorta, without rupture: Secondary | ICD-10-CM

## 2020-03-20 NOTE — Progress Notes (Signed)
Called patient, NA, LMAM

## 2020-03-21 NOTE — Progress Notes (Signed)
Left vm to cb.

## 2020-03-28 ENCOUNTER — Encounter: Payer: Self-pay | Admitting: Gastroenterology

## 2020-03-28 ENCOUNTER — Ambulatory Visit (INDEPENDENT_AMBULATORY_CARE_PROVIDER_SITE_OTHER): Payer: Medicare Other | Admitting: Gastroenterology

## 2020-03-28 VITALS — BP 110/78 | HR 71 | Ht 66.0 in | Wt 175.0 lb

## 2020-03-28 DIAGNOSIS — K224 Dyskinesia of esophagus: Secondary | ICD-10-CM | POA: Diagnosis not present

## 2020-03-28 DIAGNOSIS — R1310 Dysphagia, unspecified: Secondary | ICD-10-CM | POA: Diagnosis not present

## 2020-03-28 DIAGNOSIS — K225 Diverticulum of esophagus, acquired: Secondary | ICD-10-CM | POA: Diagnosis not present

## 2020-03-28 NOTE — Patient Instructions (Signed)
If you are age 82 or older, your body mass index should be between 23-30. Your Body mass index is 28.25 kg/m. If this is out of the aforementioned range listed, please consider follow up with your Primary Care Provider.  If you are age 37 or younger, your body mass index should be between 19-25. Your Body mass index is 28.25 kg/m. If this is out of the aformentioned range listed, please consider follow up with your Primary Care Provider.   You have been scheduled for a Barium Esophogram at [facility] Radiology (1st floor of the hospital) on 04/12/20 at 11:00 am. Please arrive 15 minutes prior to your appointment for registration. Make certain not to have anything to eat or drink 3 hours prior to your test. If you need to reschedule for any reason, please contact radiology at [phone] to do so. __________________________________________________________________ A barium swallow is an examination that concentrates on views of the esophagus. This tends to be a double contrast exam (barium and two liquids which, when combined, create a gas to distend the wall of the oesophagus) or single contrast (non-ionic iodine based). The study is usually tailored to your symptoms so a good history is essential. Attention is paid during the study to the form, structure and configuration of the esophagus, looking for functional disorders (such as aspiration, dysphagia, achalasia, motility and reflux) EXAMINATION You may be asked to change into a gown, depending on the type of swallow being performed. A radiologist and radiographer will perform the procedure. The radiologist will advise you of the type of contrast selected for your procedure and direct you during the exam. You will be asked to stand, sit or lie in several different positions and to hold a small amount of fluid in your mouth before being asked to swallow while the imaging is performed .In some instances you may be asked to swallow barium coated  marshmallows to assess the motility of a solid food bolus. The exam can be recorded as a digital or video fluoroscopy procedure. POST PROCEDURE It will take 1-2 days for the barium to pass through your system. To facilitate this, it is important, unless otherwise directed, to increase your fluids for the next 24-48hrs and to resume your normal diet.  This test typically takes about 30 minutes to perform. __________________________________________________________________________________    Follow up pending the results of your imaging study.

## 2020-03-28 NOTE — Progress Notes (Signed)
03/28/2020 [name] [id number] 1936/01/14   HISTORY OF PRESENT ILLNESS:  This is an 84 year old female who is a patient of Dr. Ardis Hughs.  She is here today with complaints of dysphagia.  This complaint dates back to at least 2019 at which time she had an EGD for the same complaint.   EGD was performed in October 2019 for complaints of dysphagia and showed a mildly tortuous esophagus.  It appears that she had an esophagram performed in October 2020 that was ordered by ENT that showed esophageal dysmotility and small Zenker's diverticulum.  They say that it has worsened recently over the past month or two to the point that they are pureeing her food.  Only has issues with solid food and pills, no issues with swallowing liquids.  She actually describes food and pills getting stuck in her "throat" and points to the left side of her throat/neck.  She says that it always gets stuck on that side.  Drinks water to try to wash it down and the water goes down just fine.  Sometimes she is able to continue eating her food as well.  She says that she is tired of eating pureed food.   Past Medical History:  Diagnosis Date  . Allergy   . Anemia   . Anxiety    takes Klonopin daily  as needed  . Arthritis   . Blood transfusion without reported diagnosis   . Cataract   . Diffuse cystic mastopathy   . GERD (gastroesophageal reflux disease)   . Headache   . History of colonoscopy   . Hyperlipidemia   . Macular degeneration (senile) of retina, unspecified   . Nontoxic uninodular goiter   . Palpitations    takes Sectral nightly  . Pre-diabetes   . Sickle cell trait ([place])   . Vertigo    takes Meclizine daily as needed   Past Surgical History:  Procedure Laterality Date  . CATARACT EXTRACTION W/PHACO Right 04/03/2015   Procedure: CATARACT EXTRACTION PHACO AND INTRAOCULAR LENS PLACEMENT (IOC);  Surgeon: Marylynn Pearson, MD;  Location: [place];  Service: Ophthalmology;  Laterality: Right;  .  ESOPHAGOGASTRODUODENOSCOPY N/A 11/28/2015   Procedure: ESOPHAGOGASTRODUODENOSCOPY (EGD);  Surgeon: Gatha Mayer, MD;  Location: [facility] ENDOSCOPY;  Service: Endoscopy;  Laterality: N/A;  . TOTAL KNEE ARTHROPLASTY    . TRANSANAL EXCISION OF RECTAL MASS  07/2010   pathology: hemorrhoid.     reports that she has never smoked. She has never used smokeless tobacco. She reports that she does not drink alcohol and does not use drugs. family history includes Coronary artery disease in an other family member; Heart disease in her brother and sister. Allergies  Allergen Reactions  . Statins Other (See Comments)    Nose bleeds, muscle aches  . Ibuprofen Other (See Comments)    Blurry vision  . Prednisone Palpitations    Oral tablet: Per pt: made her heart race      Outpatient Encounter Medications as of 03/28/2020  Medication Sig  . acebutolol (SECTRAL) 200 MG capsule TAKE 1 CAPSULE BY MOUTH  TWICE DAILY  . amlodipine-olmesartan (AZOR) 10-20 MG tablet daily.   Marland Kitchen azithromycin (ZITHROMAX) 250 MG tablet Take 250 mg by mouth as directed.  . clonazePAM (KLONOPIN) 0.5 MG tablet TAKE 1 TABLET BY MOUTH TWICE A DAY AS NEEDED FOR ANXIETY  . [facility]S D3 5000 UNITS capsule Take 5,000 Units by mouth once a week.   . esomeprazole (NEXIUM) 40 MG capsule  Take 40 mg by mouth 2 (two) times daily.  Marland Kitchen HYDROcodone-acetaminophen (NORCO/VICODIN) 5-325 MG tablet Take 1 tablet by mouth every 6 (six) hours as needed.  . meclizine (ANTIVERT) 25 MG tablet TAKE 1 TABLET BY MOUTH 3 TIMES A DAY AS NEEDED VERTIGO  . Oxymetazoline HCl (NASAL SPRAY) 0.05 % SOLN No Drip Nasal Mist 0.05 %  INHALE 2 SPRAYS BY NASAL ROUTE TWICE DAILY FOR 3 DAYS  . PROAIR HFA 108 (90 Base) MCG/ACT inhaler TAKE 2 PUFFS BY MOUTH 4 TIMES A DAY   No facility-administered encounter medications on file as of 03/28/2020.     REVIEW OF SYSTEMS  : All other systems reviewed and negative except where noted in the History of Present Illness.   PHYSICAL EXAM: BP  110/78   Pulse 71   Ht 5\' 6"  (1.676 m)   Wt 175 lb (79.4 kg)   BMI 28.25 kg/m  General: Well developed AA female in no acute distress Head: Normocephalic and atraumatic Eyes:  Sclerae anicteric, conjunctiva pink. Ears: Normal auditory acuity Lungs: Clear throughout to auscultation; no W/R/R. Heart: Regular rate and rhythm; no M/R/G. Musculoskeletal: Symmetrical with no gross deformities  Skin: No lesions on visible extremities Extremities: No edema  Neurological: Alert oriented x 4, grossly non-focal Psychological:  Alert and cooperative. Normal mood and affect  ASSESSMENT AND PLAN: *Dysphagia: Sounds like it actually could be from the Zenker's diverticulum that was noted on esophagram in 2020.  ? esophageal dysmotility issue as she had EGD in 2019 and esophagram in 2020 that showed only dysmotility and Zenker's diverticulum.  Needs to eat slowly, chew food well, take small bites, take sips between meals, continue pureed diet for now if needed.  Will repeat BE with tablet to be sure that nothing new has popped up or changed over the past year and will make further decisions from there.  May need to see ENT back again.   CC:  Rocco Serene, MD

## 2020-03-29 NOTE — Progress Notes (Signed)
I agree with the agbove note, plan 

## 2020-04-12 ENCOUNTER — Other Ambulatory Visit: Payer: Self-pay

## 2020-04-12 ENCOUNTER — Ambulatory Visit (HOSPITAL_COMMUNITY)
Admission: RE | Admit: 2020-04-12 | Discharge: 2020-04-12 | Disposition: A | Discharge status: Home / Self Care | Payer: Medicare Other | Source: Ambulatory Visit | Attending: Gastroenterology | Admitting: Gastroenterology

## 2020-04-12 DIAGNOSIS — R1310 Dysphagia, unspecified: Secondary | ICD-10-CM

## 2020-09-29 ENCOUNTER — Other Ambulatory Visit: Payer: Self-pay

## 2020-09-29 ENCOUNTER — Ambulatory Visit
Admission: EM | Admit: 2020-09-29 | Discharge: 2020-09-29 | Disposition: A | Discharge status: Home / Self Care | Payer: Medicare Other | Attending: Emergency Medicine | Admitting: Emergency Medicine

## 2020-09-29 ENCOUNTER — Ambulatory Visit (INDEPENDENT_AMBULATORY_CARE_PROVIDER_SITE_OTHER): Payer: Medicare Other

## 2020-09-29 DIAGNOSIS — M1612 Unilateral primary osteoarthritis, left hip: Secondary | ICD-10-CM | POA: Diagnosis not present

## 2020-09-29 DIAGNOSIS — M25552 Pain in left hip: Secondary | ICD-10-CM

## 2020-09-29 MED ORDER — DICLOFENAC SODIUM 1 % EX GEL: 2.0000 g | Freq: Four times a day (QID) | CUTANEOUS | 0 refills | Status: AC

## 2020-09-29 MED ORDER — METHYLPREDNISOLONE 4 MG PO TBPK: ORAL_TABLET | ORAL | 0 refills | Status: DC

## 2020-09-29 NOTE — ED Triage Notes (Signed)
Pt presents with left hip pain that began about 3 weeks ago, denies injury

## 2020-09-29 NOTE — ED Provider Notes (Signed)
[facility]    CSN: [id number] Arrival date & time: 09/29/20  1453      History   Chief Complaint Chief Complaint  Patient presents with  . Hip Pain    HPI [name] is a 85 y.o. female history of GERD, GI bleed, hypertension, thoracic ascending aneurysm, presenting today for evaluation of left hip and leg pain.  Reports over the past 3 weeks she has had pain around her left hip which will radiate into her left leg.  She denies associated back pain.  Pain often will migrate to various areas of her leg.  But points to left hip as source of pain and will radiate anteriorly into groin.  Denies injury fall or trauma recently.  Denies any sensation of numbness tingling or burning.  Using Tylenol without relief.  Denies pain at rest, mainly with weightbearing.  HPI  Past Medical History:  Diagnosis Date  . Allergy   . Anemia   . Anxiety    takes Klonopin daily  as needed  . Arthritis   . Blood transfusion without reported diagnosis   . Cataract   . Diffuse cystic mastopathy   . GERD (gastroesophageal reflux disease)   . Headache   . History of colonoscopy   . Hyperlipidemia   . Macular degeneration (senile) of retina, unspecified   . Nontoxic uninodular goiter   . Palpitations    takes Sectral nightly  . Pre-diabetes   . Sickle cell trait ([place])   . Vertigo    takes Meclizine daily as needed    Patient Active Problem List   Diagnosis Date Noted  . Dysphagia 03/28/2020  . Zenker's diverticulum 03/28/2020  . Esophageal dysmotility 03/28/2020  . Dieulafoy lesion of stomach   . Upper GI bleed   . Rectal bleed 11/27/2015  . Gastrointestinal bleeding 11/27/2015  . Black stools 11/27/2015  . Symptomatic anemia 11/27/2015  . Optic neuropathy 09/01/2015  . Multinodular goiter (nontoxic) 12/26/2012  . Palpitations 03/13/2012  . DJD (degenerative joint disease) of knee 12/14/2011  . Anemia, unspecified 11/13/2010  . GERD (gastroesophageal reflux disease)  09/15/2010  . SVT/ PSVT/ PAT 08/07/2010  . Anxiety 06/26/2010  . Thoracic ascending aortic aneurysm ([place]) 05/30/2010  . HYPERLIPIDEMIA 02/20/2010  . THYROID NODULE 10/29/2007  . MACULAR DEGENERATION 10/29/2007  . Essential hypertension, benign 10/29/2007    Past Surgical History:  Procedure Laterality Date  . CATARACT EXTRACTION W/PHACO Right 04/03/2015   Procedure: CATARACT EXTRACTION PHACO AND INTRAOCULAR LENS PLACEMENT (IOC);  Surgeon: Marylynn Pearson, MD;  Location: [facility];  Service: Ophthalmology;  Laterality: Right;  . ESOPHAGOGASTRODUODENOSCOPY N/A 11/28/2015   Procedure: ESOPHAGOGASTRODUODENOSCOPY (EGD);  Surgeon: Gatha Mayer, MD;  Location: [facility] END[facility]COPY;  Service: Endoscopy;  Laterality: N/A;  . TOTAL KNEE ARTHROPLASTY    . TRANSANAL EXCISION OF RECTAL MASS  07/2010   pathology: hemorrhoid.     OB History   No obstetric history on file.      Home Medications    Prior to Admission medications   Medication Sig Start Date End Date Taking? Authorizing Provider  diclofenac Sodium (VOLTAREN) 1 % GEL Apply 2 g topically 4 (four) times daily. 09/29/20  Yes Paylin Hailu C, PA-C  methylPREDNISolone (MEDROL DOSEPAK) 4 MG TBPK tablet Take as prescribed 09/29/20  Yes Sayuri Rhames C, PA-C  acebutolol (SECTRAL) 200 MG capsule TAKE 1 CAPSULE BY MOUTH  TWICE DAILY 02/01/20   Adrian Prows, MD  amlodipine-olmesartan (AZOR) 10-20 MG tablet daily.  05/10/18  [provider]  azithromycin (ZITHROMAX) 250 MG tablet Take 250 mg by mouth as directed. 02/28/20   [provider]  clonazePAM (KLONOPIN) 0.5 MG tablet TAKE 1 TABLET BY MOUTH TWICE A DAY AS NEEDED FOR ANXIETY 04/25/12   Janith Lima, MD  [employer] D3 5000 UNITS capsule Take 5,000 Units by mouth once a week.  03/20/15   [provider]  esomeprazole (NEXIUM) 40 MG capsule Take 40 mg by mouth 2 (two) times daily. 05/31/19   [provider]  HYDROcodone-acetaminophen (NORCO/VICODIN) 5-325 MG tablet Take 1  tablet by mouth every 6 (six) hours as needed. 01/20/20   Recardo Evangelist, PA-C  meclizine (ANTIVERT) 25 MG tablet TAKE 1 TABLET BY MOUTH 3 TIMES A DAY AS NEEDED VERTIGO 09/28/11   Janith Lima, MD  Oxymetazoline HCl (NASAL SPRAY) 0.05 % SOLN No Drip Nasal Mist 0.05 %  INHALE 2 SPRAYS BY NASAL ROUTE TWICE DAILY FOR 3 DAYS    [provider]  PROAIR HFA 108 (90 Base) MCG/ACT inhaler TAKE 2 PUFFS BY MOUTH 4 TIMES A DAY 07/03/18   [provider]    Family History Family History  Problem Relation Age of Onset  . Coronary artery disease Other   . Heart disease Sister   . Heart disease Brother   . Colon cancer Neg Hx   . Cancer Neg Hx   . Stroke Neg Hx   . Neuropathy Neg Hx   . Multiple sclerosis Neg Hx   . Migraines Neg Hx   . Esophageal cancer Neg Hx   . Rectal cancer Neg Hx   . Stomach cancer Neg Hx     Social History Social History   Tobacco Use  . Smoking status: Never Smoker  . Smokeless tobacco: Never Used  Vaping Use  . Vaping Use: Never used  Substance Use Topics  . Alcohol use: No  . Drug use: No     Allergies   Statins, Ibuprofen, and Prednisone   Review of Systems Review of Systems  Constitutional: Negative for fatigue and fever.  Eyes: Negative for visual disturbance.  Respiratory: Negative for shortness of breath.   Cardiovascular: Negative for chest pain.  Gastrointestinal: Negative for abdominal pain, nausea and vomiting.  Musculoskeletal: Positive for arthralgias and myalgias. Negative for joint swelling.  Skin: Negative for color change, rash and wound.  Neurological: Negative for dizziness, weakness, light-headedness and headaches.     Physical Exam Triage Vital Signs ED Triage Vitals  Enc Vitals Group     BP      Pulse      Resp      Temp      Temp src      SpO2      Weight      Height      Head Circumference      Peak Flow      Pain Score      Pain Loc      Pain Edu?      Excl. in [place]?    No data  found.  Updated Vital Signs BP 130/76   Pulse 66   Temp 98.3 F (36.8 C)   Resp 18   SpO2 93%   Visual Acuity Right Eye Distance:   Left Eye Distance:   Bilateral Distance:    Right Eye Near:   Left Eye Near:    Bilateral Near:     Physical Exam Vitals and nursing note reviewed.  Constitutional:  Appearance: She is well-developed.     Comments: No acute distress  HENT:     Head: Normocephalic and atraumatic.     Nose: Nose normal.  Eyes:     Conjunctiva/sclera: Conjunctivae normal.  Cardiovascular:     Rate and Rhythm: Normal rate.  Pulmonary:     Effort: Pulmonary effort is normal. No respiratory distress.  Abdominal:     General: There is no distension.  Musculoskeletal:        General: Normal range of motion.     Cervical back: Neck supple.     Comments: Back: Nontender palpation of thoracic and lumbar spine midline, no palpable deformity or step-off, nontender to palpation throughout bilateral lumbar areas and upper gluteal areas  Left hip: No significant tenderness to palpation along the lateral hip or groin area, nontender throughout upper leg, strength at hips and knees 5/5 ankle bilaterally, patellar reflex 2+ bilaterally  Skin:    General: Skin is warm and dry.  Neurological:     Mental Status: She is alert and oriented to person, place, and time.      UC Treatments / Results  Labs (all labs ordered are listed, but only abnormal results are displayed) Labs Reviewed - No data to display  EKG   Radiology DG Hip Unilat With Pelvis 2-3 Views Left  Result Date: 09/29/2020 CLINICAL DATA:  Left hip pain for the past 3 weeks. No known injury. EXAM: DG HIP (WITH OR WITHOUT PELVIS) 2-3V LEFT COMPARISON:  None. FINDINGS: Minimal bilateral femoral head and neck junction spur formation. This is greater on the right with mild to moderate joint space narrowing on the right. No fracture or dislocation. Lower lumbar spine degenerative changes. Right sacroiliac  joint degenerative changes. IMPRESSION: 1. Mild bilateral hip degenerative changes, greater on the right. 2. Lower lumbar spine degenerative changes. 3. Right sacroiliac joint degenerative changes. Electronically Signed   By: Claudie Revering M.D.   On: 09/29/2020 16:47    Procedures Procedures (including critical care time)  Medications Ordered in UC Medications - No data to display  Initial Impression / Assessment and Plan / UC Course  I have reviewed the triage vital signs and the nursing notes.  Pertinent labs & imaging results that were available during my care of the patient were reviewed by me and considered in my medical decision making (see chart for details).     Osteoarthritis of left hip-using Tylenol without relief, has blurry vision with ibuprofen and given history of aortic aneurysm will defer further NSAIDs, has intolerance to prednisone causing palpitations, discussed trial of alternative steroid as well as use of topical diclofenac.  Advised patient if she has similar symptoms with Medrol Dosepak as she does prednisone she may stop this.  Encouraged to take with food and earlier in the day.  Follow-up with primary care/sports medicine/Ortho if symptoms persisting.  Discussed strict return precautions. Patient verbalized understanding and is agreeable with plan.  Final Clinical Impressions(s) / UC Diagnoses   Final diagnoses:  Acute hip pain, left  Osteoarthritis of left hip, unspecified osteoarthritis type     Discharge Instructions     May begin Medrol Dosepak x 6 days- with food Diclofenac gel to hip/leg 4 times daily Follow up with orthopedics if persisting    ED Prescriptions    Medication Sig Dispense Auth. Provider   methylPREDNISolone (MEDROL DOSEPAK) 4 MG TBPK tablet Take as prescribed 21 each Johannah Rozas C, PA-C   diclofenac Sodium (VOLTAREN) 1 % GEL Apply  2 g topically 4 (four) times daily. 150 g Laquana Villari, [place] C, PA-C     PDMP not reviewed this  encounter.   Janith Lima, [place] 09/30/20 [phone]

## 2020-09-29 NOTE — Discharge Instructions (Addendum)
May begin Medrol Dosepak x 6 days- with food Diclofenac gel to hip/leg 4 times daily Follow up with orthopedics if persisting

## 2021-03-07 ENCOUNTER — Ambulatory Visit: Payer: Medicare Other | Admitting: Cardiology

## 2021-03-07 NOTE — Progress Notes (Deleted)
Subjective:  Primary Physician/Referring:  Andree Moro, DO  Patient ID: [name], female    DOB: 01/09/1936, 85 y.o.   MRN: [id number]  No chief complaint on file.   HPI: [name]  is a 85 y.o. female  with  Hypertension, hyperglycemia, chronic palpitations and event and Holter monitor in 2014 and 2019 revealing occasional PAC and PVC,  hyperlipidemia, asymptomatic thoracic aortic aneurysm of moderate size and also inferior mesenteric aneurysm presents for annual visit. She has multiple medication intolerances including statins.  Thoracic aortic aneurysm incidentally detected by CT scan of the chest performed in 2014 at 4.7 cm and has remained stable by last CTA in 2018, also has been well correlated with echocardiogram findings.   States that since being on acebutolol, symptoms of palpitations are essentially resolved and blood pressure is also very well controlled. She has not had any chest pain and states that she is doing well.  In July 2021, she had an accidental fall leading to fracture of her clavicle and blunt chest trauma on the right and also right foot.  She is still recuperating from this.  She is wondering if there is any change in her aorta.  Past Medical History:  Diagnosis Date   Allergy    Anemia    Anxiety    takes Klonopin daily  as needed   Arthritis    Blood transfusion without reported diagnosis    Cataract    Diffuse cystic mastopathy    GERD (gastroesophageal reflux disease)    Headache    History of colonoscopy    Hyperlipidemia    Macular degeneration (senile) of retina, unspecified    Nontoxic uninodular goiter    Palpitations    takes Sectral nightly   Pre-diabetes    Sickle cell trait (HCC)    Vertigo    takes Meclizine daily as needed    Past Surgical History:  Procedure Laterality Date   CATARACT EXTRACTION W/PHACO Right 04/03/2015   Procedure: CATARACT EXTRACTION PHACO AND INTRAOCULAR LENS PLACEMENT ([place]);  Surgeon: Marylynn Pearson,  MD;  Location: [facility];  Service: Ophthalmology;  Laterality: Right;   ESOPHAGOGASTRODUODENOSCOPY N/A 11/28/2015   Procedure: ESOPHAGOGASTRODUODENOSCOPY (EGD);  Surgeon: Gatha Mayer, MD;  Location: [facility] ENDOSCOPY;  Service: Endoscopy;  Laterality: N/A;   TOTAL KNEE ARTHROPLASTY     TRANSANAL EXCISION OF RECTAL MASS  07/2010   pathology: hemorrhoid.     Social History   Socioeconomic History   Marital status: Divorced    Spouse name: Not on file   Number of children: 2   Years of education: 12   Highest education level: Not on file  Occupational History   Not on file  Tobacco Use   Smoking status: Never   Smokeless tobacco: Never  Vaping Use   Vaping Use: Never used  Substance and Sexual Activity   Alcohol use: No   Drug use: No   Sexual activity: Never  Other Topics Concern   Not on file  Social History Narrative   Lives with daughter   Caffeine use: none   Regular Exercise -  NO      Social Determinants of Health   Financial Resource Strain: Not on file  Food Insecurity: Not on file  Transportation Needs: Not on file  Physical Activity: Not on file  Stress: Not on file  Social Connections: Not on file  Intimate Partner Violence: Not on file    Current Outpatient Medications on File Prior to Visit  Medication Sig Dispense Refill   acebutolol (SECTRAL) 200 MG capsule TAKE 1 CAPSULE BY MOUTH  TWICE DAILY 180 capsule 3   amlodipine-olmesartan (AZOR) 10-20 MG tablet daily.      azithromycin (ZITHROMAX) 250 MG tablet Take 250 mg by mouth as directed.     clonazePAM (KLONOPIN) 0.5 MG tablet TAKE 1 TABLET BY MOUTH TWICE A DAY AS NEEDED FOR ANXIETY 60 tablet 0   CVS D3 5000 UNITS capsule Take 5,000 Units by mouth once a week.   2   diclofenac Sodium (VOLTAREN) 1 % GEL Apply 2 g topically 4 (four) times daily. 150 g 0   esomeprazole (NEXIUM) 40 MG capsule Take 40 mg by mouth 2 (two) times daily.     HYDROcodone-acetaminophen (NORCO/VICODIN) 5-325 MG tablet Take 1 tablet by  mouth every 6 (six) hours as needed. 10 tablet 0   meclizine (ANTIVERT) 25 MG tablet TAKE 1 TABLET BY MOUTH 3 TIMES A DAY AS NEEDED VERTIGO 50 tablet 0   methylPREDNISolone (MEDROL DOSEPAK) 4 MG TBPK tablet Take as prescribed 21 each 0   Oxymetazoline HCl (NASAL SPRAY) 0.05 % SOLN No Drip Nasal Mist 0.05 %  INHALE 2 SPRAYS BY NASAL ROUTE TWICE DAILY FOR 3 DAYS     PROAIR HFA 108 (90 Base) MCG/ACT inhaler TAKE 2 PUFFS BY MOUTH 4 TIMES A DAY     No current facility-administered medications on file prior to visit.    Review of Systems  Cardiovascular:  Positive for chest pain (right upper chest). Negative for dyspnea on exertion and leg swelling.  Musculoskeletal:  Positive for joint pain (right shoulder and clavicle).  Gastrointestinal:  Negative for melena.     Objective:  There were no vitals taken for this visit. There is no height or weight on file to calculate BMI. Vitals with BMI 09/29/2020 03/28/2020 03/01/2020  Height - _0  _1   Weight - 175 lbs 176 lbs 13 oz  BMI - 93.81 82.99  Systolic 371 696 789  Diastolic 76 78 70  Pulse 66 71 69     Physical Exam Constitutional:      General: She is not in acute distress.    Appearance: She is well-developed.  Neck:     Thyroid: No thyromegaly.     Vascular: No JVD.  Cardiovascular:     Rate and Rhythm: Normal rate and regular rhythm.     Pulses: Normal pulses and intact distal pulses.     Heart sounds: S1 normal and S2 normal. Murmur heard.  Harsh early systolic murmur is present with a grade of 2/6 at the upper right sternal border radiating to the neck.  High-pitched blowing decrescendo early diastolic murmur is present with a grade of 2/4 at the upper right sternal border radiating to the apex.    No gallop.     Comments: No JVD. No pedal edema. Pulmonary:     Effort: Pulmonary effort is normal.     Breath sounds: Normal breath sounds.  Abdominal:     General: Bowel sounds are normal.     Palpations: Abdomen is soft.   Skin:    General: Skin is warm and dry.   Laboratory Examination: 06/29/2017: Creatinine 0.74, EGFR 89, potassium 3.9, CMP normal.  RBC 3.68, normal H&H, CBC otherwise normal.  CMP Latest Ref Rng & Units 08/07/2017 11/27/2015 11/27/2015  Glucose 65 - 99 mg/dL 109(H) 90 101(H)  BUN 6 - 20 mg/dL _2 Creatinine 0.44 - 1.00 mg/dL 0.79  0.65 0.77  Sodium 135 - 145 mmol/L 137 139 140  Potassium 3.5 - 5.1 mmol/L 3.9 3.8 4.0  Chloride 101 - 111 mmol/L 104 111 111  CO2 22 - 32 mmol/L _0 Calcium 8.9 - 10.3 mg/dL 9.5 8.6(L) 8.8(L)  Total Protein 6.0 - 8.5 g/dL - - -  Total Bilirubin 0.0 - 1.2 mg/dL - - -  Alkaline Phos 39 - 117 IU/L - - -  AST 0 - 40 IU/L - - -  ALT 0 - 32 IU/L - - -   CBC Latest Ref Rng & Units 08/07/2017 11/30/2015 11/29/2015  WBC 4.0 - 10.5 K/uL 6.3 5.3 5.2  Hemoglobin 12.0 - 15.0 g/dL 12.6 8.7(L) 8.8(L)  Hematocrit 36.0 - 46.0 % 36.4 25.9(L) 26.2(L)  Platelets 150 - 400 K/uL 192 182 164   Lipid Panel     Component Value Date/Time   CHOL 203 (H) 08/28/2015 1604   TRIG 172 (H) 08/28/2015 1604   HDL 57 08/28/2015 1604   CHOLHDL 3.6 08/28/2015 1604   CHOLHDL 3 06/24/2011 0911   VLDL 17.0 06/24/2011 0911   LDLCALC 112 (H) 08/28/2015 1604   LDLDIRECT 139.2 04/15/2011 0908   HEMOGLOBIN A1C Lab Results  Component Value Date   HGBA1C 5.7 (H) 08/28/2015   MPG 117 (H) 03/18/2010   TSH No results for input(s): TSH in the last 8760 hours.   Radiology:  CT angiogram chest 08/08/2017: There is an ascending aortic aneurysm with diameter of 4.4 cm, unchanged. The main pulmonary artery remains dilated with diameter  of 3.6 cm. Comparision:  09/30/2016: Unchanged aneurysmal enlargement of the ascending aorta measuring 46 mm.  Recommend semiannual imaging by CTA or MRA.  Chronic dilatation of the pulmonary arterial tree as seen with pulmonary hypertension. No significant change from CT angiogram chest and abdomen: 10/07/2015,  02/02/2013:  4.7 cm.  CT of the abdomen and  pelvis 01/20/2020: Known inferior mesenteric artery aneurysm measures approximately 9 x 7 mm, previously measured approximately 8 x 6 mm on 08/08/2017. 4. Aortic atherosclerosis. (ICD10-I70.0)  Cardiac studies:   Event monitor 09/07/2017-09/21/2017: Normal sinus rhythm with rare PAC. 2 patient triggered events for fluttered/skipped beats revealed normal sinus rhythm with one PAC. 2 patient triggered events for fluttered/skipped beats revealed normal sinus rhythm and PAC. No othersignificant arrhythmia noted. Minimal heart rate 59 bpm and maxillary 85 bpm.  Holter Monitor 48 hours 06/23/2012: Predominant rhythm is sinus rhythm. Occasional PVC and PAC noted.   Lexiscan myoview stress 12/09/12:  1. Resting EKG NSR, Poor R wave progression. Stress EKG was non diagnostic for ischemia. No ST-T changes of ischemia noted with pharmacologic stress testing. Stress symptoms included shortness of breath and light headedness.  2. The perfusion study demonstrated normal isotope uptake both at rest and stress. There was no evidence of ischemia or scar. Dynamic gated images reveal normal wall motion and endocardial thickening. Left ventricular ejection fraction was estimated to be 71%.  Echocardiogram 03/15/2020: Left ventricle cavity is normal in size and wall thickness. Normal global wall motion. Normal LV systolic function with EF 54%. Doppler evidence of grade I (impaired) diastolic dysfunction, normal LAP. Left atrial cavity is moderately dilated. Aneurysmal interatrial septum without 2D or color Doppler evidence of shunting. Structurally normal trileaflet aortic valve.  Moderate (Grade II) aortic regurgitation. Mild (Grade I) mitral regurgitation. Mild tricuspid regurgitation. Mild pulmonic regurgitation.  No evidence of pulmonary hypertension. Aortic root diameter measures 4.1 cm at sinuses of Valsalva. No significant change from 01/09/2019.  EKG:  *** EKG 03/01/2020: Sinus rhythm with first-degree AV block  at rate of 72 bpm, normal axis.  No evidence of ischemia, otherwise normal EKG. compared to 07/01/2017, left anterior fascicular block not present.  Assessment:      ICD-10-CM   1. Thoracic ascending aortic aneurysm (HCC)  I71.2     2. Moderate aortic regurgitation  I35.1     3. Essential hypertension, benign  I10      Recommendations:    [name]  is a  85 y.o.   female  with  Hypertension, hyperglycemia, palpitations due to occasional PAC and PVC,  hyperlipidemia, asymptomatic thoracic aortic aneurysm of moderate size with moderate aortic regurgitation and also inferior mesenteric aneurysm presents for annual visit. She has multiple medication intolerances including statins.  Thoracic aortic aneurysm incidentally detected by CT scan of the chest performed in 2014 at 4.7 cm and has remained stable by last CTA in 2018, also has been well correlated with echocardiogram findings.   Patient states that she is doing remarkably well since being on  Acebutalol, blood pressure has been very well controlled.  I also reviewed her CT angiogram of the abdomen performed when she had a fall in July 2021 and also reviewed her labs.  There was no change noted.  I also reviewed the results of the recently performed echocardiogram, there is a good correlation between echocardiogram and the CT scan.  I will repeat the annual echocardiogram to follow-up on the ascending aortic aneurysm and AI.  She also has a moderate-sized eccentric aneurysm that appears to have slightly enlarged.  We discussed regarding very low risk of rupture and signs and symptoms of rupture in detail and to go to the emergency room if she ever had severe abdominal discomfort.  Risks and complications from using a covered stent and restenosis risk discussed with the patient.  I will repeat CT scan probably next year to follow-up on the aneurysm.   I reviewed literature regarding rare visceral artery aneurysm. Indications for  surgery/endovascular repair discssed.  It should be considered in all patients with symptoms related to the aneurysms, if the aneurysm is more than 2 cm in diameter, if the patient is pregnant,  >2.5 to 3 cm in elderly patients or if there is demonstrated growth of the aneurysm.   I would like to see her back in 12 months in the office.  40 minute OV encounter in evaluation of her complex cardiovascular issues and review of records.    Adrian Prows, MD, [facility] 03/07/2021, 5:18 AM Office: [phone]

## 2021-04-22 ENCOUNTER — Ambulatory Visit: Payer: Medicare Other | Admitting: Internal Medicine

## 2021-04-24 ENCOUNTER — Encounter: Payer: Self-pay | Admitting: Internal Medicine

## 2021-04-24 ENCOUNTER — Other Ambulatory Visit: Payer: Self-pay

## 2021-04-24 ENCOUNTER — Ambulatory Visit (INDEPENDENT_AMBULATORY_CARE_PROVIDER_SITE_OTHER): Payer: Medicare (Managed Care) | Admitting: Internal Medicine

## 2021-04-24 VITALS — BP 130/72 | HR 66 | Ht 66.0 in | Wt 173.4 lb

## 2021-04-24 DIAGNOSIS — I7121 Aneurysm of the ascending aorta, without rupture: Secondary | ICD-10-CM | POA: Diagnosis not present

## 2021-04-24 DIAGNOSIS — I10 Essential (primary) hypertension: Secondary | ICD-10-CM | POA: Diagnosis not present

## 2021-04-24 NOTE — Patient Instructions (Addendum)
Medication Instructions:  Your physician recommends that you continue on your current medications as directed. Please refer to the Current Medication list given to you today.  *If you need a refill on your cardiac medications before your next appointment, please call your pharmacy*   Lab Work: Your physician recommends that you return for lab work in: 3-7 days before CT scan:  BMET If you have labs (blood work) drawn today and your tests are completely normal, you will receive your results only by: [place] (if you have MyChart) OR A paper copy in the mail If you have any lab test that is abnormal or we need to change your treatment, we will call you to review the results.   Testing/Procedures: Non-Cardiac CT Angiography (CTA), is a special type of CT scan that uses a computer to produce multi-dimensional views of major blood vessels throughout the body. In CT angiography, a contrast material is injected through an IV to help visualize the blood vessels    Follow-Up: At [facility], you and your health needs are our priority.  As part of our continuing mission to provide you with exceptional heart care, we have created designated Provider Care Teams.  These Care Teams include your primary Cardiologist (physician) and Advanced Practice Providers (APPs -  Physician Assistants and Nurse Practitioners) who all work together to provide you with the care you need, when you need it.  We recommend signing up for the patient portal called "MyChart".  Sign up information is provided on this After Visit Summary.  MyChart is used to connect with patients for Virtual Visits (Telemedicine).  Patients are able to view lab/test results, encounter notes, upcoming appointments, etc.  Non-urgent messages can be sent to your provider as well.   To learn more about what you can do with MyChart, go to [email or web address].    Your next appointment:   6 month(s)  The format for your next  appointment:   In Person  Provider:   Phineas Inches, MD   Other Instructions

## 2021-04-24 NOTE — Progress Notes (Signed)
Cardiology Office Note:    Date:  04/24/2021   ID:  [name], DOB 1935/10/06, MRN [id number]  PCP:  Andree Moro, DO   [facility] Providers Cardiologist:  None     Referring MD: Andree Moro, DO   No chief complaint on file. Thoracic aneurysm  History of Present Illness:    [name] is a 85 y.o. female with a hx of thoracic aortic aneurysm, well controlled HTN, referral for follow up thoracic aortic aneurysm   Her daughter is here and she noted that a Dr. Kandyce Rud on [facility]. He was a cardiologist who screened for thoracic aneurysm. She believes it was about 4 cm. No heart disease history. No heart catheterization. She had a stress test treadmill and regadenason and they were normal.  It was a long time ago. No smoking history. No heart disease in parents. Two kids all healthy. She watches TV during the day. She cleans around the house and dishes.An aide comes to help. She goes to [facility] every Saturday and spends time with them. She denies angina, dyspnea on exertion, lower extremity edema, PND or orthopnea.  She notes some heart fluttering. No syncope.  03/2018 Crt- 0.7 TSH 0.8 LDL 128 HDL 60   EKG 03/01/2020: sinus rhythm, 1st degree AV block  Past Medical History:  Diagnosis Date   Allergy    Anemia    Anxiety    takes Klonopin daily  as needed   Arthritis    Blood transfusion without reported diagnosis    Cataract    Diffuse cystic mastopathy    GERD (gastroesophageal reflux disease)    Headache    History of colonoscopy    Hyperlipidemia    Hypertension    Macular degeneration (senile) of retina, unspecified    Nontoxic uninodular goiter    Palpitations    takes Sectral nightly   Pre-diabetes    Sickle cell trait (HCC)    Vertigo    takes Meclizine daily as needed    Past Surgical History:  Procedure Laterality Date   CATARACT EXTRACTION W/PHACO Right 04/03/2015   Procedure: CATARACT EXTRACTION PHACO AND INTRAOCULAR LENS PLACEMENT ([place]);   Surgeon: Marylynn Pearson, MD;  Location: [place];  Service: Ophthalmology;  Laterality: Right;   ESOPHAGOGASTRODUODENOSCOPY N/A 11/28/2015   Procedure: ESOPHAGOGASTRODUODENOSCOPY (EGD);  Surgeon: Gatha Mayer, MD;  Location: [facility] ENDOSCOPY;  Service: Endoscopy;  Laterality: N/A;   TOTAL KNEE ARTHROPLASTY     TRANSANAL EXCISION OF RECTAL MASS  07/2010   pathology: hemorrhoid.     Current Medications: No outpatient medications have been marked as taking for the 04/24/21 encounter (Appointment) with Janina Mayo, MD.     Allergies:   Statins, Ibuprofen, and Prednisone   Social History   Socioeconomic History   Marital status: Divorced    Spouse name: Not on file   Number of children: 2   Years of education: 12   Highest education level: Not on file  Occupational History   Not on file  Tobacco Use   Smoking status: Never   Smokeless tobacco: Never  Vaping Use   Vaping Use: Never used  Substance and Sexual Activity   Alcohol use: No   Drug use: No   Sexual activity: Never  Other Topics Concern   Not on file  Social History Narrative   Lives with daughter   Caffeine use: none   Regular Exercise -  NO      Social Determinants of Health  Financial Resource Strain: Not on file  Food Insecurity: Not on file  Transportation Needs: Not on file  Physical Activity: Not on file  Stress: Not on file  Social Connections: Not on file     Family History: The patient's family history includes Coronary artery disease in an other family member; Heart disease in her brother and sister. There is no history of Colon cancer, Cancer, Stroke, Neuropathy, Multiple sclerosis, Migraines, Esophageal cancer, Rectal cancer, or Stomach cancer.  ROS:   Please see the history of present illness.     All other systems reviewed and are negative.  EKGs/Labs/Other Studies Reviewed:    The following studies were reviewed today:   EKG:  EKG is  ordered today.  The ekg ordered today demonstrates    NSR, 1st degree AV block  Recent Labs: No results found for requested labs within last 8760 hours.  Recent Lipid Panel    Component Value Date/Time   CHOL 203 (H) 08/28/2015 1604   TRIG 172 (H) 08/28/2015 1604   HDL 57 08/28/2015 1604   CHOLHDL 3.6 08/28/2015 1604   CHOLHDL 3 06/24/2011 0911   VLDL 17.0 06/24/2011 0911   LDLCALC 112 (H) 08/28/2015 1604   LDLDIRECT 139.2 04/15/2011 0908     Risk Assessment/Calculations:           Physical Exam:    VS:  There were no vitals taken for this visit.    Wt Readings from Last 3 Encounters:  03/28/20 175 lb (79.4 kg)  03/01/20 176 lb 12.8 oz (80.2 kg)  01/20/20 177 lb (80.3 kg)     GEN:  Well nourished, well developed in no acute distress HEENT: Normal NECK: No JVD; No carotid bruits CARDIAC: RRR, no murmurs, rubs, gallops RESPIRATORY:  Clear to auscultation without rales, wheezing or rhonchi  ABDOMEN: Soft, non-tender, non-distended MUSCULOSKELETAL:  No edema; No deformity  SKIN: Warm and dry NEUROLOGIC:  Alert and oriented x 3 PSYCHIATRIC:  Normal affect   ASSESSMENT:    #Thoracic aortic aneurysm: Reported 4cm prior. Will repeat CTA chest/abd. She is otherwise asymptomatic.  #First degree block: We discussed the meaning. Has no concerning AV block or symptoms. Will follow.   PLAN:    In order of problems listed above:  CTA chest/abd Follow up 6 months      Medication Adjustments/Labs and Tests Ordered: Current medicines are reviewed at length with the patient today.  Concerns regarding medicines are outlined above.    Signed, Janina Mayo, MD  04/24/2021 7:11 AM    [place]

## 2021-08-04 ENCOUNTER — Telehealth: Payer: Self-pay | Admitting: Internal Medicine

## 2021-08-04 NOTE — Telephone Encounter (Signed)
[name] is calling to check the status of the referral and would like to be called to make the appointment so that she can get the transportation together for the pt.

## 2021-08-29 ENCOUNTER — Other Ambulatory Visit: Payer: Self-pay | Admitting: Hospitalist

## 2021-08-29 DIAGNOSIS — R7303 Prediabetes: Secondary | ICD-10-CM

## 2021-08-29 DIAGNOSIS — I10 Essential (primary) hypertension: Secondary | ICD-10-CM

## 2021-08-29 DIAGNOSIS — E785 Hyperlipidemia, unspecified: Secondary | ICD-10-CM

## 2021-09-01 ENCOUNTER — Other Ambulatory Visit: Payer: Self-pay | Admitting: Hospitalist

## 2021-09-01 DIAGNOSIS — I10 Essential (primary) hypertension: Secondary | ICD-10-CM

## 2021-09-01 DIAGNOSIS — R7303 Prediabetes: Secondary | ICD-10-CM

## 2021-09-01 DIAGNOSIS — E785 Hyperlipidemia, unspecified: Secondary | ICD-10-CM

## 2021-09-01 DIAGNOSIS — I729 Aneurysm of unspecified site: Secondary | ICD-10-CM

## 2021-09-24 ENCOUNTER — Ambulatory Visit
Admission: RE | Admit: 2021-09-24 | Discharge: 2021-09-24 | Disposition: A | Discharge status: Home / Self Care | Payer: Medicare (Managed Care) | Source: Ambulatory Visit | Attending: Hospitalist | Admitting: Hospitalist

## 2021-09-24 DIAGNOSIS — I10 Essential (primary) hypertension: Secondary | ICD-10-CM

## 2021-09-24 DIAGNOSIS — R7303 Prediabetes: Secondary | ICD-10-CM

## 2021-09-24 DIAGNOSIS — I729 Aneurysm of unspecified site: Secondary | ICD-10-CM

## 2021-09-24 DIAGNOSIS — E785 Hyperlipidemia, unspecified: Secondary | ICD-10-CM

## 2021-09-24 MED ORDER — IOPAMIDOL (ISOVUE-370) INJECTION 76%
75.0000 mL | Freq: Once | INTRAVENOUS | Status: AC | PRN
  Administered 2021-09-24: 75 mL via INTRAVENOUS

## 2021-09-24 NOTE — Telephone Encounter (Signed)
Referral updated and routed for approval. ?

## 2021-09-29 ENCOUNTER — Other Ambulatory Visit: Payer: Self-pay | Admitting: Hospitalist

## 2021-09-29 DIAGNOSIS — E042 Nontoxic multinodular goiter: Secondary | ICD-10-CM

## 2021-10-10 ENCOUNTER — Ambulatory Visit
Admission: RE | Admit: 2021-10-10 | Discharge: 2021-10-10 | Disposition: A | Discharge status: Home / Self Care | Payer: Medicare (Managed Care) | Source: Ambulatory Visit | Attending: Hospitalist | Admitting: Hospitalist

## 2021-10-10 DIAGNOSIS — E042 Nontoxic multinodular goiter: Secondary | ICD-10-CM

## 2021-10-21 ENCOUNTER — Ambulatory Visit (INDEPENDENT_AMBULATORY_CARE_PROVIDER_SITE_OTHER): Payer: Medicare (Managed Care) | Admitting: Internal Medicine

## 2021-10-21 ENCOUNTER — Encounter: Payer: Self-pay | Admitting: Internal Medicine

## 2021-10-21 VITALS — BP 116/70 | HR 72 | Ht 66.0 in | Wt 175.6 lb

## 2021-10-21 DIAGNOSIS — I351 Nonrheumatic aortic (valve) insufficiency: Secondary | ICD-10-CM | POA: Diagnosis not present

## 2021-10-21 DIAGNOSIS — I7121 Aneurysm of the ascending aorta, without rupture: Secondary | ICD-10-CM

## 2021-10-21 NOTE — Patient Instructions (Signed)
Medication Instructions:  ?No Changes In Medications at this time.  ?*If you need a refill on your cardiac medications before your next appointment, please call your pharmacy* ? ?Testing/Procedures: ?Your physician has requested that you have an echocardiogram. Echocardiography is a painless test that uses sound waves to create images of your heart. It provides your doctor with information about the size and shape of your heart and how well your heart?s chambers and valves are working. You may receive an ultrasound enhancing agent through an IV if needed to better visualize your heart during the echo.This procedure takes approximately one hour. There are no restrictions for this procedure. This will take place at the [address].  ? ?Follow-Up: ?At [facility], you and your health needs are our priority.  As part of our continuing mission to provide you with exceptional heart care, we have created designated Provider Care Teams.  These Care Teams include your primary Cardiologist (physician) and Advanced Practice Providers (APPs -  Physician Assistants and Nurse Practitioners) who all work together to provide you with the care you need, when you need it. ? ?We recommend signing up for the patient portal called "MyChart".  Sign up information is provided on this After Visit Summary.  MyChart is used to connect with patients for Virtual Visits (Telemedicine).  Patients are able to view lab/test results, encounter notes, upcoming appointments, etc.  Non-urgent messages can be sent to your provider as well.   ?To learn more about what you can do with MyChart, go to [email or web address].   ? ?Your next appointment:   ?6 month(s) ? ?The format for your next appointment:   ?In Person ? ?Provider:   ?Janina Mayo, MD   ? ? ? ? ?  ?

## 2021-10-21 NOTE — Progress Notes (Addendum)
?Cardiology Office Note:   ? ?Date:  10/21/2021  ? ?ID:  [name], DOB 1936-05-06, MRN [id number] ? ?PCP:  Andree Moro, DO ?  ?[facility] Providers ?Cardiologist:  None    ? ?Referring MD: Andree Moro, DO  ? ?No chief complaint on file. ?Thoracic aneurysm ? ?History of Present Illness:   ? ?[name] is a 86 y.o. female with a hx of thoracic aortic aneurysm, well controlled HTN, referral for follow up thoracic aortic aneurysm ? ?Her daughter is here and she noted that a Dr. Kandyce Rud on [facility]. He was a cardiologist who screened for thoracic aneurysm. She believes it was about 4 cm. No heart disease history. No heart catheterization. She had a stress test treadmill and regadenason and they were normal.  It was a long time ago. No smoking history. No heart disease in parents. Two kids all healthy. She watches TV during the day. She cleans around the house and dishes.An aide comes to help. She goes to [facility] every Saturday and spends time with them. She denies angina, dyspnea on exertion, lower extremity edema, PND or orthopnea.  She notes some heart fluttering. No syncope. ? ?03/2018 ?Crt- 0.7 ?TSH 0.8 ?LDL 128 ?HDL 60 ? ? ?EKG 03/01/2020: sinus rhythm, 1st degree AV block ? ?Echo: normal LF fxn, moderate AI, mild MR, aortic root 4.1 cm of valsalva ? ?Interim Hx: ?CTA was ordered to assess for aneurysm, not done. She denies symptoms. She denies sob or chest pain. ? ?Past Medical History:  ?Diagnosis Date  ? Allergy   ? Anemia   ? Anxiety   ? takes Klonopin daily  as needed  ? Arthritis   ? Blood transfusion without reported diagnosis   ? Cataract   ? Diffuse cystic mastopathy   ? GERD (gastroesophageal reflux disease)   ? Headache   ? History of colonoscopy   ? Hyperlipidemia   ? Hypertension   ? Macular degeneration (senile) of retina, unspecified   ? Nontoxic uninodular goiter   ? Palpitations   ? takes Sectral nightly  ? Pre-diabetes   ? Sickle cell trait ([place])   ? Vertigo   ? takes Meclizine daily as  needed  ? ? ?Past Surgical History:  ?Procedure Laterality Date  ? CATARACT EXTRACTION W/PHACO Right 04/03/2015  ? Procedure: CATARACT EXTRACTION PHACO AND INTRAOCULAR LENS PLACEMENT ([facility]);  Surgeon: Marylynn Pearson, MD;  Location: [place];  Service: Ophthalmology;  Laterality: Right;  ? ESOPHAGOGASTRODUODENOSCOPY N/A 11/28/2015  ? Procedure: ESOPHAGOGASTRODUODENOSCOPY (EGD);  Surgeon: Gatha Mayer, MD;  Location: [facility] ENDOSCOPY;  Service: Endoscopy;  Laterality: N/A;  ? TOTAL KNEE ARTHROPLASTY    ? TRANSANAL EXCISION OF RECTAL MASS  07/2010  ? pathology: hemorrhoid.   ? ? ?Current Medications: ?Current Outpatient Medications on File Prior to Visit  ?Medication Sig Dispense Refill  ? amlodipine-olmesartan (AZOR) 10-20 MG tablet daily.    ? CVS D3 5000 UNITS capsule Take 5,000 Units by mouth once a week.   2  ? diclofenac Sodium (VOLTAREN) 1 % GEL Apply 2 g topically 4 (four) times daily. 150 g 0  ? esomeprazole (NEXIUM) 40 MG capsule Take 40 mg by mouth 2 (two) times daily.    ? meclizine (ANTIVERT) 25 MG tablet TAKE 1 TABLET BY MOUTH 3 TIMES A DAY AS NEEDED VERTIGO 50 tablet 0  ? ?No current facility-administered medications on file prior to visit.  ? ? ? ?Allergies:   Statins, Ibuprofen, and Prednisone  ? ?Social History  ? ?  Socioeconomic History  ? Marital status: Divorced  ?  Spouse name: Not on file  ? Number of children: 2  ? Years of education: 43  ? Highest education level: Not on file  ?Occupational History  ? Not on file  ?Tobacco Use  ? Smoking status: Never  ? Smokeless tobacco: Never  ?Vaping Use  ? Vaping Use: Never used  ?Substance and Sexual Activity  ? Alcohol use: No  ? Drug use: No  ? Sexual activity: Never  ?Other Topics Concern  ? Not on file  ?Social History Narrative  ? Lives with daughter  ? Caffeine use: none  ? Regular Exercise -  NO  ?   ? ?Social Determinants of Health  ? ?Financial Resource Strain: Not on file  ?Food Insecurity: Not on file  ?Transportation Needs: Not on file  ?Physical  Activity: Not on file  ?Stress: Not on file  ?Social Connections: Not on file  ?  ? ?Family History: ?The patient's family history includes Coronary artery disease in an other family member; Heart disease in her brother and sister. There is no history of Colon cancer, Cancer, Stroke, Neuropathy, Multiple sclerosis, Migraines, Esophageal cancer, Rectal cancer, or Stomach cancer. ? ?ROS:   ?Please see the history of present illness.    ? All other systems reviewed and are negative. ? ?EKGs/Labs/Other Studies Reviewed:   ? ?The following studies were reviewed today: ? ? ?EKG:  EKG is  ordered today.  The ekg ordered today demonstrates  ? ?NSR, 1st degree AV block ? ?Recent Labs: ?No results found for requested labs within last 8760 hours.  ?Recent Lipid Panel ?   ?Component Value Date/Time  ? CHOL 203 (H) 08/28/2015 1604  ? TRIG 172 (H) 08/28/2015 1604  ? HDL 57 08/28/2015 1604  ? CHOLHDL 3.6 08/28/2015 1604  ? CHOLHDL 3 06/24/2011 0911  ? VLDL 17.0 06/24/2011 0911  ? [place] 112 (H) 08/28/2015 1604  ? LDLDIRECT 139.2 04/15/2011 0908  ? ? ? ?Risk Assessment/Calculations:   ?  ? ?    ? ?Physical Exam:   ? ?VS: ? ?Vitals:  ? 10/21/21 1101  ?BP: 116/70  ?Pulse: 72  ?SpO2: 96%  ? ? ? ?Wt Readings from Last 3 Encounters:  ?04/24/21 173 lb 6.4 oz (78.7 kg)  ?03/28/20 175 lb (79.4 kg)  ?03/01/20 176 lb 12.8 oz (80.2 kg)  ?  ? ?GEN:  Well nourished, well developed in no acute distress ?HEENT: Normal ?NECK: No JVD; No carotid bruits ?CARDIAC: RRR, +diastolic murmur at the sternal borde ?RESPIRATORY:  Clear to auscultation without rales, wheezing or rhonchi  ?ABDOMEN: Soft, non-tender, non-distended ?MUSCULOSKELETAL:  No edema; No deformity  ?SKIN: Warm and dry ?NEUROLOGIC:  Alert and oriented x 3 ?PSYCHIATRIC:  Normal affect  ? ?ASSESSMENT:   ? ?#Thoracic aortic aneurysm: Root 4.1 cm.; proximal ascending 4.5 cm.  Will schedule TTE, can assess aneurysm with this modality ? ?#Moderate AI: TTE per above for surveillance.   ? ?#First degree block: We discussed the meaning. Has no concerning AV block or symptoms. Will follow. ? ?#HTN: well controlled Continue norvasc -olmesartan 10-20 mg daily ? ? ?PLAN:   ? ?In order of problems listed above: ? ?TTE ?Follow up 6 months ? ? ?   ?Medication Adjustments/Labs and Tests Ordered: ?Current medicines are reviewed at length with the patient today.  Concerns regarding medicines are outlined above.  ? ? ?Signed, ?Janina Mayo, MD  ?10/21/2021 9:13 AM    ?[facility] ? ?

## 2021-10-27 ENCOUNTER — Encounter (HOSPITAL_COMMUNITY): Payer: Self-pay | Admitting: Internal Medicine

## 2021-11-13 ENCOUNTER — Encounter (HOSPITAL_COMMUNITY): Payer: Self-pay | Admitting: Internal Medicine

## 2021-11-27 ENCOUNTER — Telehealth (HOSPITAL_COMMUNITY): Payer: Self-pay | Admitting: Internal Medicine

## 2021-11-27 NOTE — Telephone Encounter (Signed)
Just an FYI. We have made several attempts to contact this patient including sending a letter to schedule or reschedule their echocardiogram. We will be removing the patient from the echo [place].  11/13/21 MAILED LETTER [facility]  11/10/21 Called cell # and VM IS FULL x 3 @ 12:13/[facility] Called HM # and LMCB to schedule @ 12:14/[facility]W  10/23/21 called and VM is full @ 11:26/LBW called HM # and LVM @ 11:26/LBW  10/21/21 Called and VM is full @ 1:14  ( pt was in the office in am and not scheduled) [facility]      Thank you

## 2022-04-27 ENCOUNTER — Ambulatory Visit: Payer: Medicare (Managed Care) | Admitting: Internal Medicine

## 2022-04-27 NOTE — Progress Notes (Deleted)
Name: [name]  MRN/ DOB: [id number], 08-27-1935    Age/ Sex: 86 y.o., female    PCP: Andree Moro, DO   Reason for Endocrinology Evaluation: Multinodular goiter     Date of Initial Endocrinology Evaluation: 04/27/2022     HPI: [name] is a 86 y.o. female with a past medical history of HTN, GERD, multinodular goiter, esophageal dysmotility. The patient presented for initial endocrinology clinic visit on 04/27/2022 for consultative assistance with her multinodular goiter.   She has been diagnosed with multinodular goiter in October 2020 on thyroid ultrasound, none met criteria for a biopsy at the time.  Repeat thyroid ultrasound in April 2023 showed decrease in size of one of the nodules with stability of the rest.  HISTORY:  Past Medical History:  Past Medical History:  Diagnosis Date   Allergy    Anemia    Anxiety    takes Klonopin daily  as needed   Arthritis    Blood transfusion without reported diagnosis    Cataract    Diffuse cystic mastopathy    GERD (gastroesophageal reflux disease)    Headache    History of colonoscopy    Hyperlipidemia    Hypertension    Macular degeneration (senile) of retina, unspecified    Nontoxic uninodular goiter    Palpitations    takes Sectral nightly   Pre-diabetes    Sickle cell trait (HCC)    Vertigo    takes Meclizine daily as needed   Past Surgical History:  Past Surgical History:  Procedure Laterality Date   CATARACT EXTRACTION W/PHACO Right 04/03/2015   Procedure: CATARACT EXTRACTION PHACO AND INTRAOCULAR LENS PLACEMENT ([place]);  Surgeon: Marylynn Pearson, MD;  Location: [place];  Service: Ophthalmology;  Laterality: Right;   ESOPHAGOGASTRODUODENOSCOPY N/A 11/28/2015   Procedure: ESOPHAGOGASTRODUODENOSCOPY (EGD);  Surgeon: Gatha Mayer, MD;  Location: [facility] ENDOSCOPY;  Service: Endoscopy;  Laterality: N/A;   TOTAL KNEE ARTHROPLASTY     TRANSANAL EXCISION OF RECTAL MASS  07/2010   pathology: hemorrhoid.     Social  History:  reports that she has never smoked. She has never used smokeless tobacco. She reports that she does not drink alcohol and does not use drugs. Family History: family history includes Coronary artery disease in an other family member; Heart disease in her brother and sister.   HOME MEDICATIONS: Allergies as of 04/27/2022       Reactions   Statins Other (See Comments)   Nose bleeds, muscle aches   Ibuprofen Other (See Comments)   Blurry vision   Prednisone Palpitations   Oral tablet: Per pt: made her heart race        Medication List        Accurate as of April 27, 2022 11:08 AM. If you have any questions, ask your nurse or doctor.          amlodipine-olmesartan 10-20 MG tablet Commonly known as: AZOR daily.   CVS D3 125 MCG (5000 UT) capsule Generic drug: Cholecalciferol Take 5,000 Units by mouth once a week.   diclofenac Sodium 1 % Gel Commonly known as: VOLTAREN Apply 2 g topically 4 (four) times daily.   esomeprazole 40 MG capsule Commonly known as: NEXIUM Take 40 mg by mouth 2 (two) times daily.   meclizine 25 MG tablet Commonly known as: ANTIVERT TAKE 1 TABLET BY MOUTH 3 TIMES A DAY AS NEEDED VERTIGO          REVIEW OF SYSTEMS: A comprehensive  ROS was conducted with the patient and is negative except as per HPI and below:  ROS     OBJECTIVE:  VS: There were no vitals taken for this visit.   Wt Readings from Last 3 Encounters:  10/21/21 175 lb 9.6 oz (79.7 kg)  04/24/21 173 lb 6.4 oz (78.7 kg)  03/28/20 175 lb (79.4 kg)     EXAM: General: Pt appears well and is in NAD  Eyes: External eye exam normal without stare, lid lag or exophthalmos.  EOM intact.  PERRL.  Neck: General: Supple without adenopathy. Thyroid: Thyroid size normal.  No goiter or nodules appreciated. No thyroid bruit.  Lungs: Clear with good BS bilat with no rales, rhonchi, or wheezes  Heart: Auscultation: RRR.  Abdomen: Normoactive bowel sounds, soft, nontender,  without masses or organomegaly palpable  Extremities:  BL LE: No pretibial edema normal ROM and strength.  Mental Status: Judgment, insight: Intact Orientation: Oriented to time, place, and person Mood and affect: No depression, anxiety, or agitation     DATA REVIEWED: *** Thyroid ultrasound 10/10/2021  Estimated total number of nodules >/= 1 cm: 1   Number of spongiform nodules >/=  2 cm not described below (TR1): 0   Number of mixed cystic and solid nodules >/= 1.5 cm not described below (TR2): 0   _________________________________________________________   Nodule labeled 1 in the isthmus, decreasing in size from the prior, now below 1 cm, unchanged for greater than 8 years, and does not meet criteria for surveillance.   Nodule labeled 2 in the right thyroid, spongiform, does not meet criteria for surveillance.   Nodule labeled 3 in the left thyroid, 11 mm, essentially unchanged for greater than 8 years and does not meet criteria for further surveillance.   No adenopathy   Recommendations follow those established by the new ACR TI-RADS criteria (J Am Coll Radiol 2017;14:587-595).   IMPRESSION: Similar appearance of multinodular thyroid, as above.       ASSESSMENT/PLAN/RECOMMENDATIONS:   Multinodular goiter    Medications :  Signed electronically by: Mack Guise, MD  [facility] Endocrinology  [facility] [address] [place], [place] [address] Phone: [phone] FAX: [phone]   CC: Andree Moro, [place] [address] Phone: [phone] Fax: [phone]   Return to Endocrinology clinic as below: Future Appointments  Date Time Provider [place]  04/27/2022  1:40 PM Macoy Rodwell, Melanie Crazier, MD [facility] None

## 2022-09-01 ENCOUNTER — Encounter: Payer: Self-pay | Admitting: Internal Medicine

## 2022-11-30 ENCOUNTER — Other Ambulatory Visit: Payer: Self-pay | Admitting: Family Medicine

## 2022-12-03 ENCOUNTER — Telehealth: Payer: Self-pay

## 2022-12-03 NOTE — Telephone Encounter (Signed)
Called patient daughter left a message for call back

## 2022-12-03 NOTE — Telephone Encounter (Signed)
We received a referral from [facility] who is with [facility] for abdominal pain. Called patient primary number which is patient daughter number which her name is Mindi Junker and she is on patient DPR form. She states that Arita Miss is wanting patient to transfer her care to our office because she has moved from [employer] to [place] and it will be easier for the patient to come to our office.

## 2023-01-05 ENCOUNTER — Emergency Department (HOSPITAL_COMMUNITY): Payer: Medicare (Managed Care)

## 2023-01-05 ENCOUNTER — Encounter (HOSPITAL_COMMUNITY): Payer: Self-pay

## 2023-01-05 ENCOUNTER — Other Ambulatory Visit: Payer: Self-pay

## 2023-01-05 ENCOUNTER — Emergency Department (HOSPITAL_COMMUNITY)
Admission: EM | Admit: 2023-01-05 | Discharge: 2023-01-05 | Disposition: A | Discharge status: Home / Self Care | Payer: Medicare (Managed Care) | Attending: Emergency Medicine | Admitting: Emergency Medicine

## 2023-01-05 DIAGNOSIS — R600 Localized edema: Secondary | ICD-10-CM | POA: Diagnosis not present

## 2023-01-05 DIAGNOSIS — R519 Headache, unspecified: Secondary | ICD-10-CM

## 2023-01-05 DIAGNOSIS — Z79899 Other long term (current) drug therapy: Secondary | ICD-10-CM | POA: Diagnosis not present

## 2023-01-05 DIAGNOSIS — K219 Gastro-esophageal reflux disease without esophagitis: Secondary | ICD-10-CM | POA: Diagnosis not present

## 2023-01-05 DIAGNOSIS — I10 Essential (primary) hypertension: Secondary | ICD-10-CM | POA: Diagnosis not present

## 2023-01-05 LAB — COMPREHENSIVE METABOLIC PANEL
ALT: 16 U/L (ref 0–44)
AST: 18 U/L (ref 15–41)
Albumin: 3.9 g/dL (ref 3.5–5.0)
Alkaline Phosphatase: 79 U/L (ref 38–126)
Anion gap: 12 (ref 5–15)
BUN: 15 mg/dL (ref 8–23)
CO2: 24 mmol/L (ref 22–32)
Calcium: 9.4 mg/dL (ref 8.9–10.3)
Chloride: 101 mmol/L (ref 98–111)
Creatinine, Ser: 0.77 mg/dL (ref 0.44–1.00)
GFR, Estimated: >60 mL/min (ref >60)
Glucose, Bld: 132 mg/dL — ABNORMAL HIGH (ref 70–99)
Potassium: 4 mmol/L (ref 3.5–5.1)
Sodium: 137 mmol/L (ref 135–145)
Total Bilirubin: 0.7 mg/dL (ref 0.3–1.2)
Total Protein: 7.7 g/dL (ref 6.5–8.1)

## 2023-01-05 LAB — CBC
HCT: 36.1 % (ref 36.0–46.0)
Hemoglobin: 12.1 g/dL (ref 12.0–15.0)
MCH: 32.9 pg (ref 26.0–34.0)
MCHC: 33.5 g/dL (ref 30.0–36.0)
MCV: 98.1 fL (ref 80.0–100.0)
Platelets: 170 10*3/uL (ref 150–400)
RBC: 3.68 MIL/uL — ABNORMAL LOW (ref 3.87–5.11)
RDW: 13.1 % (ref 11.5–15.5)
WBC: 4.9 10*3/uL (ref 4.0–10.5)
nRBC: 0 % (ref 0.0–0.2)

## 2023-01-05 LAB — LIPASE, BLOOD: Lipase: 32 U/L (ref 11–51)

## 2023-01-05 LAB — URINALYSIS, ROUTINE W REFLEX MICROSCOPIC
Bilirubin Urine: NEGATIVE
Glucose, UA: NEGATIVE mg/dL
Hgb urine dipstick: NEGATIVE
Ketones, ur: NEGATIVE mg/dL
Leukocytes,Ua: NEGATIVE
Nitrite: NEGATIVE
Protein, ur: NEGATIVE mg/dL
Specific Gravity, Urine: 1.01 (ref 1.005–1.030)
pH: 7 (ref 5.0–8.0)

## 2023-01-05 MED ORDER — FENTANYL CITRATE PF 50 MCG/ML IJ SOSY: 50.0000 ug | PREFILLED_SYRINGE | Freq: Once | INTRAMUSCULAR | Status: DC

## 2023-01-05 MED ORDER — ONDANSETRON HCL 4 MG/2ML IJ SOLN: 4.0000 mg | Freq: Once | INTRAMUSCULAR | Status: DC

## 2023-01-05 NOTE — ED Provider Notes (Signed)
EMERGENCY DEPARTMENT AT [facility] Provider Note   CSN: [id number] Arrival date & time: 01/05/23  1737     History  Chief Complaint  Patient presents with   Abdominal Pain    [name] is a 87 y.o. female.  Patient is an 87 year old female with a history of GERD, hypertension, hyperlipidemia, palpitations and ascending aortic aneurysm at 4.4 cm which has been unchanged since 2019 who is presenting today with several complaints.  Her main complaint is of a headache.  She reports falling 2 weeks ago and hitting her head.  She initially had a headache but it ultimately went away however she started having headache last night.  She did take 2 Excedrin and reports initially the headache felt better but it came back again today and that concerned her.  She states it is a dull ache in the back of her head but has not caused any vision changes, difficulty walking, unilateral numbness or weakness.  She has been able to speak without difficulty.  She does report that 2 days ago when she was adjusting herself in her bed she hit her head again on the headboard.  She does not take any anticoagulation.  She also reports that since this morning when she woke up she had some pain in her upper abdominal area.  She did eat last at noon but did not feel like that changed the pain at all.  She reports now the pain is almost completely gone and only minimal in the epigastric area.  She reports that she thinks it is there because she forgot to take the medication that she typically takes from her stomach last night and this morning.  She has no nausea vomiting, chest pain, shortness of breath or diarrhea.  No recent medication changes.  The history is provided by the patient.  Abdominal Pain      Home Medications Prior to Admission medications   Medication Sig Start Date End Date Taking? Authorizing Provider  amlodipine-olmesartan (AZOR) 10-20 MG tablet Take 1 tablet by mouth daily.  05/10/18  Yes [provider]  esomeprazole (NEXIUM) 40 MG capsule Take 40 mg by mouth 2 (two) times daily. 05/31/19  Yes [provider]  diclofenac Sodium (VOLTAREN) 1 % GEL Apply 2 g topically 4 (four) times daily. Patient not taking: Reported on 01/05/2023 09/29/20   Wieters, [employer], PA-C  meclizine (ANTIVERT) 25 MG tablet TAKE 1 TABLET BY MOUTH 3 TIMES A DAY AS NEEDED VERTIGO Patient not taking: Reported on 01/05/2023 09/28/11   Etta Grandchild, MD      Allergies    Statins, Ibuprofen, and Prednisone    Review of Systems   Review of Systems  Gastrointestinal:  Positive for abdominal pain.    Physical Exam Updated Vital Signs BP (!) 128/93   Pulse 67   Temp 98.2 F (36.8 C)   Resp (!) 22   Ht 5\' 6"  (1.676 m)   Wt 78.9 kg   SpO2 95%   BMI 28.08 kg/m  Physical Exam Vitals and nursing note reviewed.  Constitutional:      General: She is not in acute distress.    Appearance: She is well-developed.  HENT:     Head: Normocephalic and atraumatic.     Right Ear: Tympanic membrane normal.     Left Ear: Tympanic membrane normal.  Eyes:     General: No visual field deficit.    Pupils: Pupils are equal, round, and reactive  to light.  Cardiovascular:     Rate and Rhythm: Normal rate and regular rhythm.     Pulses: Normal pulses.     Heart sounds: Normal heart sounds. No murmur heard.    No friction rub.  Pulmonary:     Effort: Pulmonary effort is normal.     Breath sounds: Normal breath sounds. No wheezing or rales.  Abdominal:     General: Bowel sounds are normal. There is no distension.     Palpations: Abdomen is soft.     Tenderness: There is no abdominal tenderness. There is no guarding or rebound.     Comments: No reproducible abdominal pain  Musculoskeletal:        General: No tenderness. Normal range of motion.     Right lower leg: Edema present.     Left lower leg: Edema present.     Comments: 1+ pitting edema at the ankles bilaterally  Skin:     General: Skin is warm and dry.     Findings: No rash.  Neurological:     Mental Status: She is alert and oriented to person, place, and time. Mental status is at baseline.     Cranial Nerves: No cranial nerve deficit or dysarthria.     Sensory: Sensation is intact.     Motor: Motor function is intact. No pronator drift.     Coordination: Coordination is intact.     Gait: Gait is intact.     Comments: Patient was able to walk to the bathroom without any difficulty and did not need any assistance  Psychiatric:        Behavior: Behavior normal.     ED Results / Procedures / Treatments   Labs (all labs ordered are listed, but only abnormal results are displayed) Labs Reviewed  COMPREHENSIVE METABOLIC PANEL - Abnormal; Notable for the following components:      Result Value   Glucose, Bld 132 (*)    All other components within normal limits  CBC - Abnormal; Notable for the following components:   RBC 3.68 (*)    All other components within normal limits  URINALYSIS, ROUTINE W REFLEX MICROSCOPIC - Abnormal; Notable for the following components:   Color, Urine STRAW (*)    All other components within normal limits  LIPASE, BLOOD    EKG EKG Interpretation Date/Time:  Tuesday January 05 2023 20:27:53 EDT Ventricular Rate:  69 PR Interval:  243 QRS Duration:  98 QT Interval:  419 QTC Calculation: 449 R Axis:   4  Text Interpretation: Sinus rhythm Prolonged PR interval Abnormal R-wave progression, early transition No significant change since last tracing Confirmed by Gwyneth Sprout ([id number]) on 01/05/2023 8:42:14 PM  Radiology CT Head Wo Contrast  Result Date: 01/05/2023 CLINICAL DATA:  Head trauma EXAM: CT HEAD WITHOUT CONTRAST TECHNIQUE: Contiguous axial images were obtained from the base of the skull through the vertex without intravenous contrast. RADIATION DOSE REDUCTION: This exam was performed according to the departmental dose-optimization program which includes automated  exposure control, adjustment of the mA and/or kV according to patient size and/or use of iterative reconstruction technique. COMPARISON:  None Available. FINDINGS: Brain: There is no mass, hemorrhage or extra-axial collection. The size and configuration of the ventricles and extra-axial CSF spaces are normal. There is hypoattenuation of the white matter, most commonly indicating chronic small vessel disease. 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 Vascular: Atherosclerotic calcification of the vertebral and internal carotid arteries at the skull base. No abnormal  hyperdensity of the major intracranial arteries or dural venous sinuses. Skull: The visualized skull base, calvarium and extracranial soft tissues are normal. Sinuses/Orbits: No fluid levels or advanced mucosal thickening of the visualized paranasal sinuses. No mastoid or middle ear effusion. The orbits are normal. IMPRESSION: 1. No acute intracranial abnormality. 2. 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 Electronically Signed   By: Deatra Robinson M.D.   On: 01/05/2023 20:40    Procedures Procedures    Medications Ordered in ED Medications - No data to display  ED Course/ Medical Decision Making/ A&P                             Medical Decision Making Amount and/or Complexity of Data Reviewed External Data Reviewed: notes. Labs: ordered. Decision-making details documented in ED Course. Radiology: ordered and independent interpretation performed. Decision-making details documented in ED Course. ECG/medicine tests: ordered and independent interpretation performed. Decision-making details documented in ED Course.   Pt with multiple medical problems and comorbidities and presenting today with a complaint that caries a high risk for morbidity and mortality.  Here today with 2 separate complaints.  Her initial complaint is of a headache which she is concerned about because she reports hitting her head again 2 days ago  after falling and hitting her head 2 weeks ago.  Patient does not take anticoagulation but given her headache, age and recent head trauma we will do a CT to ensure no acute findings.  Also patient is complaining of some abdominal pain.  The nature of the pain seems to be GERD.  She reports that it is only minimally hurting her at this time.  She has not had any associated symptoms and reports she forgot to take the medicine she normally takes for that discomfort.  Low suspicion that this is complication of her AAA, ACS, pancreatitis, hepatitis or infection.  I independently interpreted patient's EKG and labs.  CMP, lipase CBC, UA all without acute findings.  EKG without acute changes.  I have independently visualized and interpreted pt's images today. Head CT is negative for any acute bleed or abnormality.  All these findings were discussed with the patient.  At this time feel that she is stable for discharge home.         Final Clinical Impression(s) / ED Diagnoses Final diagnoses:  Acute intractable headache, unspecified headache type  Gastroesophageal reflux disease without esophagitis    Rx / DC Orders ED Discharge Orders     None         Gwyneth Sprout, MD 01/05/23 2059

## 2023-01-05 NOTE — ED Triage Notes (Addendum)
Pt c/o intermittent generalized throbbing abd pain that started today, and HA that began last night; pt states she fell and hit head 2 weeks ago; denies being evaluated after fall; no neuro deficits; nothing makes abd pain better or worse; no N/V. No fevers, no urinary symptoms

## 2023-01-05 NOTE — Discharge Instructions (Signed)
All the labs are normal.  Your head scan was normal.  Make sure you take your medication when you get home tonight.

## 2023-02-04 DIAGNOSIS — K921 Melena: Secondary | ICD-10-CM | POA: Insufficient documentation

## 2023-02-07 NOTE — Progress Notes (Deleted)
Lisa Amy, PA-C [address]  [address]  [place], [place] [address]  Main: [phone]  Fax: [phone]   Gastroenterology Consultation  Referring Provider:     Karl Ito, DO Primary Care Physician:  Karl Ito, DO Primary Gastroenterologist:  *** Reason for Consultation:     Abdominal Pain, Dysphagia        HPI:   [name] is a 87 y.o. y/o female referred for consultation & management  by Karl Ito, DO.  Previous patient of Dr. Rob Bunting at [facility] in [place].  Transferring care to our office.  She recently moved to [place] and needs GI closer to her home.  Current symptoms:  Labs 01/05/2023 showed normal CBC, CMP, and lipase.  Hemoglobin 12.1.  Normal LFTs.  Last colonoscopy done 05/2018 by Dr. Christella Hartigan showed several areas of focal ulceration and erosion throughout the ascending colon and cecum.  Left colon mucosal normal.  Medium internal and external hemorrhoids.  Colon biopsies showed no active inflammation.  No evidence of Crohn's or inflammatory bowel disease.  Colon Ulcers were thought due to taking NSAID - meloxicam, which was discontinued.  Colonoscopy 03/2018 showed 1 small ulcer in the ascending colon.  CT angiogram chest, abdomen, and pelvis was done 09/2021 showed 4.4 cm stable ascending aortic aneurysm.  Last EGD 03/2018 by Dr. Christella Hartigan showed mild gastritis, mildly torturous esophagus, otherwise normal.  Biopsies negative for H. pylori.  Hx Dysphagia.  Hx Zenker's diverticulum that was noted on esophagram in 2020.  EGD in 2019 and esophagram in 2020 that showed only dysmotility and Zenker's diverticulum.    Needs to eat slowly, chew food well, take small bites, take sips between meals, continue pureed diet for now if needed.  May need to see ENT back again.   Past Medical History:  Diagnosis Date   Allergy    Anemia    Anxiety    takes Klonopin daily  as needed   Arthritis    Blood transfusion without reported diagnosis     Cataract    Diffuse cystic mastopathy    GERD (gastroesophageal reflux disease)    Headache    History of colonoscopy    Hyperlipidemia    Hypertension    Macular degeneration (senile) of retina, unspecified    Nontoxic uninodular goiter    Palpitations    takes Sectral nightly   Pre-diabetes    Sickle cell trait (HCC)    Vertigo    takes Meclizine daily as needed    Past Surgical History:  Procedure Laterality Date   CATARACT EXTRACTION W/PHACO Right 04/03/2015   Procedure: CATARACT EXTRACTION PHACO AND INTRAOCULAR LENS PLACEMENT (IOC);  Surgeon: Chalmers Guest, MD;  Location: [facility] OR;  Service: Ophthalmology;  Laterality: Right;   ESOPHAGOGASTRODUODENOSCOPY N/A 11/28/2015   Procedure: ESOPHAGOGASTRODUODENOSCOPY (EGD);  Surgeon: Iva Boop, MD;  Location: [facility] ENDOSCOPY;  Service: Endoscopy;  Laterality: N/A;   TOTAL KNEE ARTHROPLASTY     TRANSANAL EXCISION OF RECTAL MASS  07/2010   pathology: hemorrhoid.     Prior to Admission medications   Medication Sig Start Date End Date Taking? Authorizing Provider  amlodipine-olmesartan (AZOR) 10-20 MG tablet Take 1 tablet by mouth daily. 05/10/18   [provider]  diclofenac Sodium (VOLTAREN) 1 % GEL Apply 2 g topically 4 (four) times daily. Patient not taking: Reported on 01/05/2023 09/29/20   Wieters, Hallie C, PA-C  esomeprazole (NEXIUM) 40 MG capsule Take 40 mg by mouth 2 (two) times daily. 05/31/19  [provider]  meclizine (ANTIVERT) 25 MG tablet TAKE 1 TABLET BY MOUTH 3 TIMES A DAY AS NEEDED VERTIGO Patient not taking: Reported on 01/05/2023 09/28/11   Etta Grandchild, MD    Family History  Problem Relation Age of Onset   Coronary artery disease Other    Heart disease Sister    Heart disease Brother    Colon cancer Neg Hx    Cancer Neg Hx    Stroke Neg Hx    Neuropathy Neg Hx    Multiple sclerosis Neg Hx    Migraines Neg Hx    Esophageal cancer Neg Hx    Rectal cancer Neg Hx    Stomach cancer Neg Hx       Social History   Tobacco Use   Smoking status: Never   Smokeless tobacco: Never  Vaping Use   Vaping status: Never Used  Substance Use Topics   Alcohol use: No   Drug use: No    Allergies as of 02/08/2023 - Review Complete 01/05/2023  Allergen Reaction Noted   Statins Other (See Comments) 08/10/2011   Ibuprofen Other (See Comments) 09/12/2008   Prednisone Palpitations 08/28/2015    Review of Systems:    All systems reviewed and negative except where noted in HPI.   Physical Exam:  There were no vitals taken for this visit. No LMP recorded. Patient is postmenopausal.  General:   Alert,  Well-developed, well-nourished, pleasant and cooperative in NAD Lungs:  Respirations even and unlabored.  Clear throughout to auscultation.   No wheezes, crackles, or rhonchi. No acute distress. Heart:  Regular rate and rhythm; no murmurs, clicks, rubs, or gallops. Abdomen:  Normal bowel sounds.  No bruits.  Soft, and non-distended without masses, hepatosplenomegaly or hernias noted.  No Tenderness.  No guarding or rebound tenderness.    Neurologic:  Alert and oriented x3;  grossly normal neurologically. Psych:  Alert and cooperative. Normal mood and affect.  Imaging Studies: No results found.  Assessment and Plan:   [name] is a 87 y.o. y/o female has been referred for ***  Follow up ***  Lisa Amy, PA-C    BP check ***

## 2023-02-08 ENCOUNTER — Ambulatory Visit: Payer: Medicare (Managed Care) | Admitting: Physician Assistant

## 2023-03-25 ENCOUNTER — Other Ambulatory Visit: Payer: Self-pay

## 2023-03-29 ENCOUNTER — Encounter: Payer: Self-pay | Admitting: Physician Assistant

## 2023-03-29 ENCOUNTER — Ambulatory Visit (INDEPENDENT_AMBULATORY_CARE_PROVIDER_SITE_OTHER): Payer: Self-pay | Admitting: Physician Assistant

## 2023-03-29 VITALS — BP 115/68 | HR 69 | Temp 98.2°F | Ht 67.0 in | Wt 167.0 lb

## 2023-03-29 DIAGNOSIS — R1310 Dysphagia, unspecified: Secondary | ICD-10-CM

## 2023-03-29 DIAGNOSIS — K219 Gastro-esophageal reflux disease without esophagitis: Secondary | ICD-10-CM

## 2023-03-29 DIAGNOSIS — K224 Dyskinesia of esophagus: Secondary | ICD-10-CM

## 2023-03-29 MED ORDER — ESOMEPRAZOLE MAGNESIUM 40 MG PO CPDR: 40.0000 mg | DELAYED_RELEASE_CAPSULE | Freq: Every day | ORAL | 3 refills | Status: AC

## 2023-03-29 NOTE — Progress Notes (Signed)
Celso Amy, PA-C [address]  [address]  [place], [place] [address]  Main: [phone]  Fax: [phone]   Gastroenterology Consultation  Referring Provider:     Karl Ito, DO Primary Care Physician:  Karl Ito, DO Primary Gastroenterologist:  Celso Amy, PA-C / Dr. Wyline Mood   Reason for Consultation:     Dysphagia        HPI:   [name] is a 87 y.o. y/o female referred for consultation & management  by Karl Ito, DO.   Previous patient of [facility].  Previously saw Dr. Christella Hartigan and Doug Sou, PA-C  History of intermittent rectal bleeding and dysphagia for many years.  Patient states she continues to have intermittent episodes of dysphagia and regurgitation of acid and food.  Worse after she lays down.  Worse if she eats a large meal.  Food is not getting stuck.  She denies nausea or vomiting or abdominal pain.  Symptom has been chronic and unchanged for many years.  She used to take Nexium, no longer taking.  Admits to acid reflux.  Denies weight loss, rectal bleeding, diarrhea or constipation.  Labs 01/05/2023 showed normal hemoglobin 12.1, hematocrit 36, MCV 98.  Normal lipase and CMP.  EGD 03/2018: for complaints of dysphagia and showed a mildly tortuous esophagus.   Barium swallow with tablet 03/2020: Small Zenker's diverticulum, mild esophageal dysmotility, otherwise normal with no stricture.  Barium tablet passed with no delay.  Colonoscopy 05/2018: Several areas of focal ulceration, erosion throughout the ascending colon and cecum.  Left colon was normal.  Medium internal and external hemorrhoids.  Pathology showed Colitis with focal erosion thought due to NSAIDs or ischemia.  No evidence of Crohn's, IBD, or polyps.  EGD June 2017 Dr. 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    Past Medical History:  Diagnosis Date   Allergy    Anemia     Anxiety    takes Klonopin daily  as needed   Arthritis    Blood transfusion without reported diagnosis    Cataract    Diffuse cystic mastopathy    GERD (gastroesophageal reflux disease)    Headache    History of colonoscopy    Hyperlipidemia    Hypertension    Macular degeneration (senile) of retina, unspecified    Nontoxic uninodular goiter    Palpitations    takes Sectral nightly   Pre-diabetes    Sickle cell trait (HCC)    Vertigo    takes Meclizine daily as needed    Past Surgical History:  Procedure Laterality Date   CATARACT EXTRACTION W/PHACO Right 04/03/2015   Procedure: CATARACT EXTRACTION PHACO AND INTRAOCULAR LENS PLACEMENT (IOC);  Surgeon: Chalmers Guest, MD;  Location: [facility] OR;  Service: Ophthalmology;  Laterality: Right;   ESOPHAGOGASTRODUODENOSCOPY N/A 11/28/2015   Procedure: ESOPHAGOGASTRODUODENOSCOPY (EGD);  Surgeon: Iva Boop, MD;  Location: [facility] ENDOSCOPY;  Service: Endoscopy;  Laterality: N/A;   TOTAL KNEE ARTHROPLASTY     TRANSANAL EXCISION OF RECTAL MASS  07/2010   pathology: hemorrhoid.     Prior to Admission medications   Medication Sig Start Date End Date Taking? Authorizing Provider  amlodipine-olmesartan (AZOR) 10-20 MG tablet Take 1 tablet by mouth daily. 05/10/18   [provider]  diclofenac Sodium (VOLTAREN) 1 % GEL Apply 2 g topically 4 (four) times daily. Patient not taking: Reported on 01/05/2023 09/29/20  Wieters, Hallie C, PA-C  esomeprazole (NEXIUM) 40 MG capsule Take 40 mg by mouth 2 (two) times daily. 05/31/19   [provider]  meclizine (ANTIVERT) 25 MG tablet TAKE 1 TABLET BY MOUTH 3 TIMES A DAY AS NEEDED VERTIGO Patient not taking: Reported on 01/05/2023 09/28/11   Etta Grandchild, MD  Multiple Vitamins-Minerals (PRESERVISION AREDS 2) CHEW Chew by mouth. 01/21/23   [provider]    Family History  Problem Relation Age of Onset   Coronary artery disease Other    Heart disease Sister    Heart disease Brother     Colon cancer Neg Hx    Cancer Neg Hx    Stroke Neg Hx    Neuropathy Neg Hx    Multiple sclerosis Neg Hx    Migraines Neg Hx    Esophageal cancer Neg Hx    Rectal cancer Neg Hx    Stomach cancer Neg Hx      Social History   Tobacco Use   Smoking status: Never   Smokeless tobacco: Never  Vaping Use   Vaping status: Never Used  Substance Use Topics   Alcohol use: No   Drug use: No    Allergies as of 03/29/2023 - Review Complete 03/29/2023  Allergen Reaction Noted   Statins Other (See Comments) 08/10/2011   Ibuprofen Other (See Comments) 09/12/2008   Prednisone Palpitations 08/28/2015    Review of Systems:    All systems reviewed and negative except where noted in HPI.   Physical Exam:  BP 115/68   Pulse 69   Temp 98.2 F (36.8 C)   Ht 5\' 7"  (1.702 m)   Wt 167 lb (75.8 kg)   BMI 26.16 kg/m  No LMP recorded. Patient is postmenopausal.  General:   Alert,  Well-developed, well-nourished, pleasant and cooperative in NAD Lungs:  Respirations even and unlabored.  Clear throughout to auscultation.   No wheezes, crackles, or rhonchi. No acute distress. Heart:  Regular rate and rhythm; no murmurs, clicks, rubs, or gallops. Abdomen:  Normal bowel sounds.  No bruits.  Soft, and non-distended without masses, hepatosplenomegaly or hernias noted.  No Tenderness.  No guarding or rebound tenderness.    Neurologic:  Alert and oriented x3;  grossly normal neurologically. Psych:  Alert and cooperative. Normal mood and affect.  Imaging Studies: No results found.  Assessment and Plan:   [name] is a 87 y.o. y/o female has been referred for chronic intermittent dysphagia for many years.  Due to esophageal dysmotility and GERD.  Gave patient reassurance regarding previous barium swallow and EGD test results.  Lengthy discussion regarding patient education.  Recommend restart Nexium and add Gaviscon after meals.  1.  Esophageal dysmotility, chronic  I gave patient education  handout from [facility] regarding esophageal dysmotility.  Lengthy discussion with patient and her daughter regarding education and treatment.  Recommend eat small frequent meals.  Eat sitting up.  Try Gaviscon 30 mL after each meal.   2.  GERD, mild  Rx Nexium 40 mg once daily.  Recommend Lifestyle Modifications to prevent Acid Reflux.  Rec. Avoid coffee, sodas, peppermint, citrus fruits, and spicey foods.  Avoid eating 2-3 hours before bedtime.   3.  Chronic dysphagia for many years; previous EGD unrevealing  Reassurance  Follow up as needed if GI symptoms worsen or persist.  Celso Amy, PA-C

## 2023-11-30 ENCOUNTER — Emergency Department: Payer: Medicare (Managed Care)

## 2023-11-30 ENCOUNTER — Encounter (HOSPITAL_COMMUNITY): Payer: Self-pay

## 2023-11-30 ENCOUNTER — Emergency Department
Admission: EM | Admit: 2023-11-30 | Discharge: 2023-11-30 | Disposition: A | Discharge status: Other Acute Inpatient Hospital | Payer: Medicare (Managed Care) | Attending: Emergency Medicine | Admitting: Emergency Medicine

## 2023-11-30 DIAGNOSIS — I700 Atherosclerosis of aorta: Secondary | ICD-10-CM | POA: Insufficient documentation

## 2023-11-30 DIAGNOSIS — I6782 Cerebral ischemia: Secondary | ICD-10-CM | POA: Diagnosis not present

## 2023-11-30 DIAGNOSIS — I723 Aneurysm of iliac artery: Secondary | ICD-10-CM | POA: Diagnosis not present

## 2023-11-30 DIAGNOSIS — I10 Essential (primary) hypertension: Secondary | ICD-10-CM | POA: Insufficient documentation

## 2023-11-30 DIAGNOSIS — R0789 Other chest pain: Secondary | ICD-10-CM | POA: Insufficient documentation

## 2023-11-30 DIAGNOSIS — S0990XA Unspecified injury of head, initial encounter: Secondary | ICD-10-CM | POA: Diagnosis present

## 2023-11-30 DIAGNOSIS — I119 Hypertensive heart disease without heart failure: Secondary | ICD-10-CM | POA: Diagnosis not present

## 2023-11-30 DIAGNOSIS — I729 Aneurysm of unspecified site: Secondary | ICD-10-CM

## 2023-11-30 DIAGNOSIS — W1809XA Striking against other object with subsequent fall, initial encounter: Secondary | ICD-10-CM | POA: Diagnosis not present

## 2023-11-30 DIAGNOSIS — I7100 Dissection of unspecified site of aorta: Secondary | ICD-10-CM | POA: Diagnosis not present

## 2023-11-30 DIAGNOSIS — I7102 Dissection of abdominal aorta: Secondary | ICD-10-CM | POA: Insufficient documentation

## 2023-11-30 LAB — TYPE AND SCREEN
ABO/RH(D): O POS
Antibody Screen: NEGATIVE

## 2023-11-30 LAB — CBC
HCT: 33.1 % — ABNORMAL LOW (ref 36.0–46.0)
Hemoglobin: 11.3 g/dL — ABNORMAL LOW (ref 12.0–15.0)
MCH: 32.8 pg (ref 26.0–34.0)
MCHC: 34.1 g/dL (ref 30.0–36.0)
MCV: 95.9 fL (ref 80.0–100.0)
Platelets: 143 10*3/uL — ABNORMAL LOW (ref 150–400)
RBC: 3.45 MIL/uL — ABNORMAL LOW (ref 3.87–5.11)
RDW: 13.1 % (ref 11.5–15.5)
WBC: 5.6 10*3/uL (ref 4.0–10.5)
nRBC: 0 % (ref 0.0–0.2)

## 2023-11-30 LAB — BASIC METABOLIC PANEL WITH GFR
Anion gap: 8 (ref 5–15)
BUN: 26 mg/dL — ABNORMAL HIGH (ref 8–23)
CO2: 23 mmol/L (ref 22–32)
Calcium: 8.5 mg/dL — ABNORMAL LOW (ref 8.9–10.3)
Chloride: 108 mmol/L (ref 98–111)
Creatinine, Ser: 0.68 mg/dL (ref 0.44–1.00)
GFR, Estimated: >60 mL/min (ref >60)
Glucose, Bld: 137 mg/dL — ABNORMAL HIGH (ref 70–99)
Potassium: 3.7 mmol/L (ref 3.5–5.1)
Sodium: 139 mmol/L (ref 135–145)

## 2023-11-30 LAB — HEPATIC FUNCTION PANEL
ALT: 17 U/L (ref 0–44)
AST: 24 U/L (ref 15–41)
Albumin: 3.3 g/dL — ABNORMAL LOW (ref 3.5–5.0)
Alkaline Phosphatase: 51 U/L (ref 38–126)
Bilirubin, Direct: 0.1 mg/dL (ref 0.0–0.2)
Indirect Bilirubin: 1 mg/dL — ABNORMAL HIGH (ref 0.3–0.9)
Total Bilirubin: 1.1 mg/dL (ref 0.0–1.2)
Total Protein: 6.2 g/dL — ABNORMAL LOW (ref 6.5–8.1)

## 2023-11-30 LAB — APTT: aPTT: 27 s (ref 24–36)

## 2023-11-30 LAB — LIPASE, BLOOD: Lipase: 28 U/L (ref 11–51)

## 2023-11-30 LAB — TROPONIN I (HIGH SENSITIVITY)
Troponin I (High Sensitivity): 6 ng/L (ref <18)
Troponin I (High Sensitivity): 10 ng/L (ref <18)

## 2023-11-30 LAB — PROTIME-INR
INR: 1.1 (ref 0.8–1.2)
Prothrombin Time: 14.5 s (ref 11.4–15.2)

## 2023-11-30 MED ORDER — FENTANYL CITRATE PF 50 MCG/ML IJ SOSY: 50.0000 ug | PREFILLED_SYRINGE | Freq: Once | INTRAMUSCULAR | Status: DC | PRN

## 2023-11-30 MED ORDER — ESMOLOL HCL-SODIUM CHLORIDE 2000 MG/100ML IV SOLN
25.0000 ug/kg/min | INTRAVENOUS | Status: DC
  Administered 2023-11-30: 25 ug/kg/min via INTRAVENOUS
  Filled 2023-11-30: qty 100

## 2023-11-30 MED ORDER — NICARDIPINE HCL IN NACL 20-0.86 MG/200ML-% IV SOLN
3.0000 mg/h | INTRAVENOUS | Status: DC
  Filled 2023-11-30: qty 200

## 2023-11-30 MED ORDER — FENTANYL CITRATE PF 50 MCG/ML IJ SOSY
50.0000 ug | PREFILLED_SYRINGE | Freq: Once | INTRAMUSCULAR | Status: AC
  Administered 2023-11-30: 50 ug via INTRAVENOUS
  Filled 2023-11-30: qty 1

## 2023-11-30 MED ORDER — HYDROMORPHONE HCL 1 MG/ML IJ SOLN
0.5000 mg | Freq: Once | INTRAMUSCULAR | Status: AC
  Administered 2023-11-30: 0.5 mg via INTRAVENOUS
  Filled 2023-11-30: qty 0.5

## 2023-11-30 MED ORDER — ONDANSETRON HCL 4 MG/2ML IJ SOLN
4.0000 mg | Freq: Once | INTRAMUSCULAR | Status: AC
  Administered 2023-11-30: 4 mg via INTRAVENOUS
  Filled 2023-11-30: qty 2

## 2023-11-30 MED ORDER — IOHEXOL 350 MG/ML SOLN
100.0000 mL | Freq: Once | INTRAVENOUS | Status: AC | PRN
  Administered 2023-11-30: 100 mL via INTRAVENOUS

## 2023-11-30 NOTE — ED Notes (Signed)
 EMATLA reviewed by this RN, pt ready for transport. Transfer consent obtained. [facility] here to transport.

## 2023-11-30 NOTE — ED Notes (Signed)
 [facility]  transfer  center  called per  DR  Peggi Bowels  MD

## 2023-11-30 NOTE — ED Notes (Signed)
XRAY  POWERSHARE  WITH  [facility] 

## 2023-11-30 NOTE — ED Provider Notes (Signed)
 [facility] Provider Note    None    (approximate)   History   Chest Pain   HPI  [name] is a 88 y.o. female with history of GERD, hypertension, hyperlipidemia who comes in with chest pain.  Patient reportedly had chest pain that woke her up this morning that was central chest pain.  Initially from EMS he did report that it radiated to her back but then later she reported it was nonradiating.  According to family she did have a fall and hit her head yesterday.  Patient appears umconfortable moving around in bed.  Difficulty at full HPI of the chest pain secondary to her discomfort.   Physical Exam   Triage Vital Signs: ED Triage Vitals  Encounter Vitals Group     BP      Systolic BP Percentile      Diastolic BP Percentile      Pulse      Resp      Temp      Temp src      SpO2      Weight      Height      Head Circumference      Peak Flow      Pain Score      Pain Loc      Pain Education      Exclude from Growth Chart     Most recent vital signs: Vitals:   11/30/23 0734 11/30/23 0746  BP: 139/70   Pulse: 65   Resp: 17   Temp:  98.3 F (36.8 C)  SpO2: 99%      General: Awake, distress noted .  CV:  Good peripheral perfusion.  Resp:  Normal effort.  Abd:  No distention. Soft and non tender  Other:  No obvious pulse deficit.  Abdomen appears soft and nontender.   ED Results / Procedures / Treatments   Labs (all labs ordered are listed, but only abnormal results are displayed) Labs Reviewed  CBC - Abnormal; Notable for the following components:      Result Value   RBC 3.45 (*)    Hemoglobin 11.3 (*)    HCT 33.1 (*)    Platelets 143 (*)    All other components within normal limits  HEPATIC FUNCTION PANEL  LIPASE, BLOOD  BASIC METABOLIC PANEL WITH GFR  PROTIME-INR  APTT  TYPE AND SCREEN  TROPONIN I (HIGH SENSITIVITY)  TROPONIN I (HIGH SENSITIVITY)     EKG  My interpretation of EKG:  EKG is sinus rate of 65  without any ST elevations or T wave inversions, normal intervals but will get repeat due to the artifact  Repeat EKG is sinus rate of 63 with maybe some minimal ST elevation in the inferior leads without any T wave inversions, normal intervals  RADIOLOGY I have reviewed the xray personally and interpreted no widened mediastinum but does have cardiomegaly   PROCEDURES:  Critical Care performed: Yes, see critical care procedure note(s)  .1-3 Lead EKG Interpretation  Performed by: Lubertha Rush, MD Authorized by: Lubertha Rush, MD     Interpretation: normal     ECG rate:  60   ECG rate assessment: normal     Rhythm: sinus rhythm     Ectopy: none     Conduction: normal   .Critical Care  Performed by: Lubertha Rush, MD Authorized by: Lubertha Rush, MD   Critical care provider statement:  Critical care time (minutes):  75   Critical care was necessary to treat or prevent imminent or life-threatening deterioration of the following conditions: ruptured dissection.   Critical care was time spent personally by me on the following activities:  Development of treatment plan with patient or surrogate, discussions with consultants, evaluation of patient's response to treatment, examination of patient, ordering and review of laboratory studies, ordering and review of radiographic studies, ordering and performing treatments and interventions, pulse oximetry, re-evaluation of patient's condition and review of old charts    MEDICATIONS ORDERED IN ED: Medications  iohexol  (OMNIPAQUE ) 350 MG/ML injection 100 mL (has no administration in time range)  fentaNYL  (SUBLIMAZE ) injection 50 mcg (50 mcg Intravenous Given 11/30/23 0746)  ondansetron  (ZOFRAN ) injection 4 mg (4 mg Intravenous Given 11/30/23 0745)     IMPRESSION / MDM / ASSESSMENT AND PLAN / ED COURSE  I reviewed the triage vital signs and the nursing notes.   Patient's presentation is most consistent with acute presentation with  potential threat to life or bodily function.   Patient comes in with chest pain.  Patient on review of her prior CT imaging has multiple known aneurysms with concern for connective tissue disease.  Given I am unable to get a good story from patient about the pain and patient looks acutely uncomfortable I did recommend CT dissection to evaluate for dissection.  Her last creatinine a few months ago was reassuring and I recommended bypassing labs because I would need to have the CT scan regardless of what patient's creatinine is given my high concern for dissection.  Also going to add on a CT head CT cervical given the report of a fall yesterday.  I have called radiology to get a stat CT scan done.  I have given patient some IV fentanyl  IV Zofran .  Her EKG maybe had a little bit of ST elevation in the inferior leads but nothing that met STEMI criteria and at this time I am more concerned about the potential for dissection that this needs to be ruled out first.  Upon my interpretation patient has a dissection noted.  I have called radiology to get stat read.  I ordered esmolol, nicardipine and instructed the nurse to get heart rates below 60 before initiating nicardipine infusion if Bps are still elevated .  I have also called [facility] for emergency transfer truck is on the way.  Unfortunately the cardiothoracic doctor at [facility] has not picked up.  I did discuss with the family and patient does not have any dementia.  She is able to take care of herself although does live with her daughter.  They are open to surgery and patient states that she would want to try to have surgery but understands that her mortality rate is very high.  8:48 AM according to Dr. Deloise Ferries at [facility] they are currently in surgery and recommended to try somewhere else.  We have already paged out to [facility] already since there was a delay with [facility].  We are also going to page out to [facility] as well  We do have a truck that is  coming out for emergency transportation.  Further discussions with patient's daughter who is a Engineer, civil (consulting).  If the surgery has significant mortality rate that there is a chance they may not want to do the surgery and focus on comfort care.  Still waiting to talk to surgeon.  On repeat evaluation her heart rate is less than 60 and  blood pressure is less than 120 systolic. Pain better controlled.   9:19 AM discussed the case with Dr. Broadus Canes from [facility] cardiothoracic surgery.  He does state that this has a very high mortality rate but that he would need to evaluate pressure in the emergency room to decide if surgery could be offered to patient.  I did discuss this with patient's daughter who is the POA.  Given patient is open to surgery she did want patient to be transferred ED to the ED for evaluation by cardiothoracic surgery.  Depend on their discussion they will decide if surgery versus comfort care will occur.  We discussed that patient has high risk for tamponade given the bleeding into the hemopericardium.  We discussed that there is a chance that patient could need pericardiocentesis or pericardial drain if patient develops tamponade physiology or death.  At this time however family report that if she got to the point where a patient's heart was to stop that they would not want surgical interventions, pericardiocentesis, pericardial drain in order to try to help get patient to long term surgical interventions.  They understand that at that point the chance for meaningful recovery would be exceedingly low.  Therefore we discussed patient's transport to [facility].  They are going to go by [facility].  We have stabilized patient here to the best of our ability her vitals are stable on esmolol infusion but there is still high risk for death on route secondary to known dissection however we do not have cardiothoracic surgery here.  Daughter understands that there is a risk that she will die on route.  She states that if  she does die that she would not want resuscitation done in the [facility] helicopter and that they would understand that patient's time of death would be called.  Per EMS in order to have her not be resuscitated on the route if she was to lose pulses we had to fill out a yellow DNR form.  I did fill this out and the daughter is aware of this.  They understand that there is a high risk that this could happen but are agreeing with transfer given we do not have cardiothoracic surgery here.  The patient is on the cardiac monitor to evaluate for evidence of arrhythmia and/or significant heart rate changes.      FINAL CLINICAL IMPRESSION(S) / ED DIAGNOSES   Final diagnoses:  Dissection of aorta, unspecified portion of aorta (HCC)  Ruptured aneurysm of artery (HCC)     Rx / DC Orders   ED Discharge Orders     None        Note:  This document was prepared using Dragon voice recognition software and may include unintentional dictation errors.   Lubertha Rush, MD 11/30/23 [phone]

## 2023-11-30 NOTE — ED Notes (Signed)
 XRAY  [facility]  WITH  [facility]

## 2023-11-30 NOTE — ED Notes (Signed)
[facility]   CALLED  PER  DR  FUNKE  MD 

## 2023-11-30 NOTE — ED Triage Notes (Signed)
 Pt bib ems from home with chest pressure that woke her up this morning, central chest, non radiating.  12 lead 1st degree block 18g RAC, 500cc NS given en route 1 in nitroglycerin paste placed en route States unable to chew asa  EMS Vitals: 127/78 RR 23 96%  Cap 18 HR 64

## 2023-11-30 NOTE — ED Notes (Signed)
 ETA 9:50

## 2023-11-30 NOTE — ED Notes (Signed)
[facility]  CALLED  PER  DR  Jari Pigg  MD

## 2023-12-21 DEATH — deceased

## 2024-01-01 NOTE — Progress Notes (Signed)
 Appt ordered due to chest pain and if patient needed heparin  uses
